# Patient Record
Sex: Male | Born: 1955 | Race: White | Hispanic: No | Marital: Single | Smoking: Former smoker
Health system: Southern US, Community
[De-identification: ages and names within clinical notes are randomized; demographics above are authoritative.]

## PROBLEM LIST (undated history)

## (undated) DIAGNOSIS — I10 Essential (primary) hypertension: Secondary | ICD-10-CM

## (undated) DIAGNOSIS — J449 Chronic obstructive pulmonary disease, unspecified: Secondary | ICD-10-CM

## (undated) DIAGNOSIS — E039 Hypothyroidism, unspecified: Secondary | ICD-10-CM

## (undated) DIAGNOSIS — R0600 Dyspnea, unspecified: Secondary | ICD-10-CM

## (undated) DIAGNOSIS — C801 Malignant (primary) neoplasm, unspecified: Secondary | ICD-10-CM

## (undated) DIAGNOSIS — I509 Heart failure, unspecified: Secondary | ICD-10-CM

## (undated) HISTORY — PX: BACK SURGERY: SHX140

---

## 2020-12-06 ENCOUNTER — Encounter (HOSPITAL_COMMUNITY): Payer: Self-pay

## 2020-12-06 NOTE — Progress Notes (Signed)
I placed an introductory phone call to this patient today. I introduced myself and explained my role in the patient's care. The patient tells me that he is new to living in this area as he moved down here to be with his children and grandchildren. Patient tells me that he has been receiving treatment for lung cancer for a year and is looking to establish care with a new oncologist. I reassure the patient that his previous oncologist has faxed his records to our clinic for Dr. Delton Coombes to review. I provided my contact information and encouraged the patient to call with any questions or concerns.

## 2020-12-08 ENCOUNTER — Inpatient Hospital Stay (HOSPITAL_COMMUNITY): Payer: Medicare Other | Attending: Hematology | Admitting: Hematology

## 2020-12-08 ENCOUNTER — Other Ambulatory Visit: Payer: Self-pay

## 2020-12-08 ENCOUNTER — Inpatient Hospital Stay (HOSPITAL_COMMUNITY): Payer: Medicare Other

## 2020-12-08 VITALS — BP 143/86 | HR 85 | Temp 97.3°F | Resp 18 | Ht 69.0 in | Wt 151.6 lb

## 2020-12-08 DIAGNOSIS — C3411 Malignant neoplasm of upper lobe, right bronchus or lung: Secondary | ICD-10-CM | POA: Insufficient documentation

## 2020-12-08 DIAGNOSIS — C3491 Malignant neoplasm of unspecified part of right bronchus or lung: Secondary | ICD-10-CM | POA: Insufficient documentation

## 2020-12-08 DIAGNOSIS — Z79899 Other long term (current) drug therapy: Secondary | ICD-10-CM | POA: Diagnosis not present

## 2020-12-08 DIAGNOSIS — C7931 Secondary malignant neoplasm of brain: Secondary | ICD-10-CM | POA: Diagnosis not present

## 2020-12-08 LAB — CBC WITH DIFFERENTIAL/PLATELET
Abs Immature Granulocytes: 0.02 10*3/uL (ref 0.00–0.07)
Basophils Absolute: 0.1 10*3/uL (ref 0.0–0.1)
Basophils Relative: 1 %
Eosinophils Absolute: 0.4 10*3/uL (ref 0.0–0.5)
Eosinophils Relative: 7 %
HCT: 44.3 % (ref 39.0–52.0)
Hemoglobin: 14.5 g/dL (ref 13.0–17.0)
Immature Granulocytes: 0 %
Lymphocytes Relative: 22 %
Lymphs Abs: 1.4 10*3/uL (ref 0.7–4.0)
MCH: 31.1 pg (ref 26.0–34.0)
MCHC: 32.7 g/dL (ref 30.0–36.0)
MCV: 95.1 fL (ref 80.0–100.0)
Monocytes Absolute: 0.6 10*3/uL (ref 0.1–1.0)
Monocytes Relative: 9 %
Neutro Abs: 4 10*3/uL (ref 1.7–7.7)
Neutrophils Relative %: 61 %
Platelets: 225 10*3/uL (ref 150–400)
RBC: 4.66 MIL/uL (ref 4.22–5.81)
RDW: 12.7 % (ref 11.5–15.5)
WBC: 6.5 10*3/uL (ref 4.0–10.5)
nRBC: 0 % (ref 0.0–0.2)

## 2020-12-08 LAB — COMPREHENSIVE METABOLIC PANEL
ALT: 18 U/L (ref 0–44)
AST: 22 U/L (ref 15–41)
Albumin: 4.3 g/dL (ref 3.5–5.0)
Alkaline Phosphatase: 81 U/L (ref 38–126)
Anion gap: 8 (ref 5–15)
BUN: 14 mg/dL (ref 8–23)
CO2: 27 mmol/L (ref 22–32)
Calcium: 9.2 mg/dL (ref 8.9–10.3)
Chloride: 97 mmol/L — ABNORMAL LOW (ref 98–111)
Creatinine, Ser: 0.85 mg/dL (ref 0.61–1.24)
GFR, Estimated: >60 mL/min (ref >60)
Glucose, Bld: 100 mg/dL — ABNORMAL HIGH (ref 70–99)
Potassium: 4.6 mmol/L (ref 3.5–5.1)
Sodium: 132 mmol/L — ABNORMAL LOW (ref 135–145)
Total Bilirubin: 0.7 mg/dL (ref 0.3–1.2)
Total Protein: 7.5 g/dL (ref 6.5–8.1)

## 2020-12-08 NOTE — Progress Notes (Signed)
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 65 y.o. 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 Dola Factor from [facility] in [place].  Today he is accompanied by his daughter-in-law, Remo Lipps, and he reports feeling well. His lung mass was discovered after his pericardial effusion was treated through a window and afterwards was detected on CT scan; he denies that he had headaches or CP. His last Beryle Flock was on February 16 and he has tolerated it well; he does not have a port. He did not receive radiation for his brain. He denies having any cardiac issues or MI's or CVA's. His breathing is now at baseline. His appetite is okay, though he still lost 4 lbs over the past month and 15 lbs since the beginning of 2022, and his family forces him to eat. He eats 2-3 meals per day.  He recently moved to [place] from [place]. He used to drive a truck. He quit smoking in August 2020. His father had prostate cancer. 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  MEDICAL HISTORY:  No past medical history on file.  SURGICAL HISTORY: ** The histories are not reviewed yet. Please review them in the "History" navigator section and refresh this [place].  SOCIAL HISTORY: Social History   Socioeconomic History  . Marital status: Single    Spouse name: Not on file  . Number of children: Not on file  . Years of education: Not on file  . Highest education level: Not on file  Occupational History  . Not on file  Tobacco Use  .  Smoking status: Former Smoker    Types: Cigarettes    Quit date: 12/01/2019    Years since quitting: 1.0  . Smokeless tobacco: Never Used  Substance and Sexual Activity  . Alcohol use: Never  . Drug use: Not on file  . Sexual activity: Not Currently  Other Topics Concern  . Not on file  Social History Narrative  . Not on file   Social Determinants of Health   Financial Resource Strain: Low Risk   . Difficulty of Paying Living Expenses: Not hard at all  Food Insecurity: No Food Insecurity  . Worried About Charity fundraiser in the Last Year: Never true  . Ran Out of Food in the Last Year: Never true  Transportation Needs: No Transportation Needs  . Lack of Transportation (Medical): No  . Lack of Transportation (Non-Medical): No  Physical Activity: Sufficiently Active  . Days of Exercise per Week: 7 days  . Minutes of Exercise per Session: 30 min  Stress: No Stress Concern Present  . Feeling of Stress : Only a little  Social Connections: Moderately Integrated  . Frequency of Communication with Friends and Family: More than three times a week  . Frequency of Social Gatherings with Friends and Family: More than three times a week  . Attends Religious Services: 1 to 4 times per year  . Active Member of Clubs or Organizations: No  . Attends Archivist Meetings: 1 to 4  times per year  . Marital Status: Divorced  Human resources officer Violence: Not At Risk  . Fear of Current or Ex-Partner: No  . Emotionally Abused: No  . Physically Abused: No  . Sexually Abused: No    FAMILY HISTORY: No family history on file.  ALLERGIES:  has no allergies on file.  MEDICATIONS:  Current Outpatient Medications  Medication Sig Dispense Refill  . albuterol (VENTOLIN HFA) 108 (90 Base) MCG/ACT inhaler Inhale into the lungs every 6 (six) hours as needed for wheezing or shortness of breath.    . fluticasone furoate-vilanterol (BREO ELLIPTA) 100-25 MCG/INH AEPB Inhale 1 puff into the lungs  daily.     No current facility-administered medications for this visit.    REVIEW OF SYSTEMS:   Review of Systems  Constitutional: Positive for unexpected weight change (lost 4 lbs in 1 month). Negative for appetite change and fatigue.  All other systems reviewed and are negative.    PHYSICAL EXAMINATION: ECOG PERFORMANCE STATUS: 1 - Symptomatic but completely ambulatory  Vitals:   12/08/20 0842  BP: (!) 143/86  Pulse: 85  Resp: 18  Temp: (!) 97.3 F (36.3 C)  SpO2: 98%   Filed Weights   12/08/20 0842  Weight: 151 lb 9.6 oz (68.8 kg)   Physical Exam Vitals reviewed.  Constitutional:      Appearance: Normal appearance.  Cardiovascular:     Rate and Rhythm: Normal rate and regular rhythm.     Pulses: Normal pulses.     Heart sounds: Normal heart sounds.  Pulmonary:     Effort: Pulmonary effort is normal.     Breath sounds: Normal breath sounds.  Chest:  Breasts:     Right: No supraclavicular adenopathy.     Left: No supraclavicular adenopathy.    Abdominal:     Palpations: Abdomen is soft. There is no hepatomegaly, splenomegaly or mass.     Tenderness: There is no abdominal tenderness.     Hernia: No hernia is present.  Lymphadenopathy:     Cervical: No cervical adenopathy.     Upper Body:     Right upper body: No supraclavicular adenopathy.     Left upper body: No supraclavicular adenopathy.     Lower Body: No right inguinal adenopathy. No left inguinal adenopathy.  Neurological:     General: No focal deficit present.     Mental Status: He is alert and oriented to person, place, and time.  Psychiatric:        Mood and Affect: Mood normal.        Behavior: Behavior normal.      LABORATORY DATA:  I have reviewed the data as listed CBC Latest Ref Rng & Units 12/08/2020  WBC 4.0 - 10.5 K/uL 6.5  Hemoglobin 13.0 - 17.0 g/dL 14.5  Hematocrit 39.0 - 52.0 % 44.3  Platelets 150 - 400 K/uL 225   CMP Latest Ref Rng & Units 12/08/2020  Glucose 70 - 99 mg/dL  100(H)  BUN 8 - 23 mg/dL 14  Creatinine 0.61 - 1.24 mg/dL 0.85  Sodium 135 - 145 mmol/L 132(L)  Potassium 3.5 - 5.1 mmol/L 4.6  Chloride 98 - 111 mmol/L 97(L)  CO2 22 - 32 mmol/L 27  Calcium 8.9 - 10.3 mg/dL 9.2  Total Protein 6.5 - 8.1 g/dL 7.5  Total Bilirubin 0.3 - 1.2 mg/dL 0.7  Alkaline Phos 38 - 126 U/L 81  AST 15 - 41 U/L 22  ALT 0 - 44 U/L 18    RADIOGRAPHIC  STUDIES: I have personally reviewed the radiological images as listed and agreed with the findings in the report. No results found.  ASSESSMENT:  1.  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 -Mutations were negative for EGFR, ALK, ROS1, RET, BRAF V600 -PD-L1 22 C3 greater than 95%. -He was started on single agent pembrolizumab every 3 weeks under the direction of Dr. Dola Factor in [place] in August 2020, later switched to every 6 weeks. -We will consider testing for K-ras G 12 C, NTR K and met exon 14 mutations upon progression.  2.  Social/family history: -He is a retired Administrator.  Quit smoking in August 2020. -Father had prostate cancer.    PLAN:  1.  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 -Last Keytruda every 6 weeks was on 11/17/2020. -We will obtain restaging CT CAP. -He will be back in 3 weeks to discuss results prior to his next dose of Keytruda.  2.  Brain metastasis: -He did not receive any radiation therapy. -He was supposed to have restaging MRI done in December in [place].  However it was not done as he was moving to [facility]. -We will arrange for the brain MRI with and without contrast.    All questions were answered. The patient knows to call the clinic with any problems, questions or concerns.   [name], MD, 12/08/20 5:40 PM  [place] [phone]   I, Milinda Antis, am acting as a scribe for Dr. Sanda Linger.  I, [name] MD, have reviewed the above documentation for accuracy and completeness, and I agree with the above.

## 2020-12-08 NOTE — Progress Notes (Signed)
START OFF PATHWAY REGIMEN - Non-Small Cell Lung   [id number]:Pembrolizumab 400 mg IV D1 q42 Days:   A cycle is every 42 days:     Pembrolizumab   **Always confirm dose/schedule in your pharmacy ordering system**  Patient Characteristics: Stage IV Metastatic, Nonsquamous, Molecular Analysis Completed, Molecular Alteration Present and Targeted Therapy Exhausted OR EGFR Exon 20+ or KRAS G12C+ Present and No Prior Chemo/Immunotherapy OR No Alteration Present, Initial  Chemotherapy/Immunotherapy, PS = 0, 1, No Alteration Present, Did Not Order Molecular Analysis/Quantity Not Sufficient for Molecular Analysis Therapeutic Status: Stage IV Metastatic Histology: Nonsquamous Cell Broad Molecular Profiling Status: Molecular Analysis Completed Molecular Analysis Results: No Alteration Present ECOG Performance Status: 1 Chemotherapy/Immunotherapy Line of Therapy: Initial Chemotherapy/Immunotherapy EGFR Exons 18-21 Mutation Testing Status: Completed and Negative ALK Fusion/Rearrangement Testing Status: Completed and Negative BRAF V600 Mutation Testing Status: Completed and Negative KRAS G12C Mutation Testing Status: Did Not Order Test MET Exon 14 Mutation Testing Status: Did Not Order Test RET Fusion/Rearrangement Testing Status: Completed and Negative NTRK Fusion/Rearrangement Testing Status: Did Not Order Test ROS1 Fusion/Rearrangement Testing Status: Completed and Negative Intent of Therapy: Non-Curative / Palliative Intent, Discussed with Patient

## 2020-12-08 NOTE — Patient Instructions (Signed)
[facility] Discharge Instructions  You were seen and examined today by Dr. Delton Coombes. Dr. Delton Coombes is a medical oncologist, meaning he specializes in managing cancer diagnoses with medication. Dr. Delton Coombes discussed your past medical history, family history of cancer and your recent cancer diagnosis and treatment regimen.  Dr. Delton Coombes has recommended that you begin eating calorie-rich foods to avoiding you losing any additional weight. Dr. Delton Coombes has recommended restaging scans, this involves an MRI of your brain and a CT of your chest, abdomen and pelvis. We will continue your current treatment regimen of Keytruda every 6 weeks.  Follow-up as scheduled.   Thank you for choosing [facility] at [facility] [place] to provide your oncology and hematology care.  To afford each patient quality time with our provider, please arrive at least 15 minutes before your scheduled appointment time.   If you have a lab appointment with the [facility] please come in thru the Main Entrance and check in at the main information desk.  You need to re-schedule your appointment should you arrive 10 or more minutes late.  We strive to give you quality time with our providers, and arriving late affects you and other patients whose appointments are after yours.  Also, if you no show three or more times for appointments you may be dismissed from the clinic at the providers discretion.     Again, thank you for choosing [facility].  Our hope is that these requests will decrease the amount of time that you wait before being seen by our physicians.       _____________________________________________________________  Should you have questions after your visit to [facility], please contact our office at [phone] and follow the prompts.  Our office hours are 8:00 a.m. and 4:30 p.m. Monday - Friday.  Please note that voicemails  left after 4:00 p.m. may not be returned until the following business day.  We are closed weekends and major holidays.  You do have access to a nurse 24-7, just call the main number to the clinic [phone] and do not press any options, hold on the line and a nurse will answer the phone.    For prescription refill requests, have your pharmacy contact our office and allow 72 hours.    Due to Covid, you will need to wear a mask upon entering the hospital. If you do not have a mask, a mask will be given to you at the Main Entrance upon arrival. For doctor visits, patients may have 1 support person age 24 or older with them. For treatment visits, patients can not have anyone with them due to social distancing guidelines and our immunocompromised population.

## 2020-12-09 ENCOUNTER — Ambulatory Visit
Admission: RE | Admit: 2020-12-09 | Discharge: 2020-12-09 | Disposition: A | Discharge status: Home / Self Care | Payer: Self-pay | Source: Ambulatory Visit | Attending: Hematology | Admitting: Hematology

## 2020-12-09 ENCOUNTER — Other Ambulatory Visit (HOSPITAL_COMMUNITY): Payer: Self-pay | Admitting: Hematology

## 2020-12-09 DIAGNOSIS — C3411 Malignant neoplasm of upper lobe, right bronchus or lung: Secondary | ICD-10-CM

## 2020-12-24 ENCOUNTER — Ambulatory Visit (HOSPITAL_COMMUNITY)
Admission: RE | Admit: 2020-12-24 | Discharge: 2020-12-24 | Disposition: A | Discharge status: Home / Self Care | Payer: Medicare Other | Source: Ambulatory Visit | Attending: Hematology | Admitting: Hematology

## 2020-12-24 DIAGNOSIS — C3411 Malignant neoplasm of upper lobe, right bronchus or lung: Secondary | ICD-10-CM | POA: Diagnosis not present

## 2020-12-24 IMAGING — MR MR HEAD WO/W CM
14 of 15 series · 38 of 48 positions shown · IV contrast (gadavist)
Comparison: [DATE].  [DATE].  [DATE]. CT [DATE].

CLINICAL DATA: Metastatic non-small cell lung cancer

EXAM:
MRI HEAD WITHOUT AND WITH CONTRAST
TECHNIQUE: Multiplanar, multiecho pulse sequences of the brain and surrounding
structures were obtained without and with intravenous contrast.
CONTRAST:  7mL GADAVIST GADOBUTROL 1 MMOL/ML IV SOLN

[Series 5: DWI · axial · 3.0mm · 0.77mm/px · z∈[-53,+91]mm · 2 of 50 slices shown (1 of 4)]
[im 1/50]
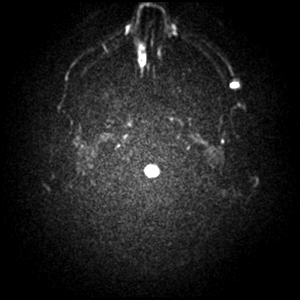
[im 50/50]
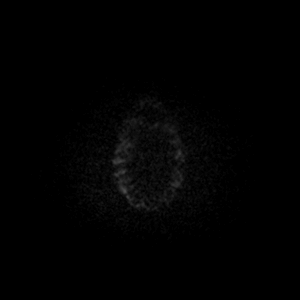

[Series 6: DWI · axial · 3.0mm · 0.77mm/px · z∈[-53,+91]mm · 3 of 50 slices shown (2 of 4)]
[im 1/50]
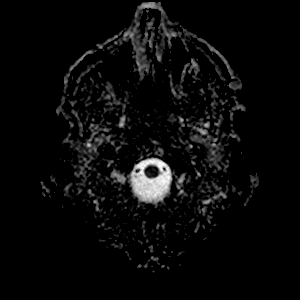
[im 25/50]
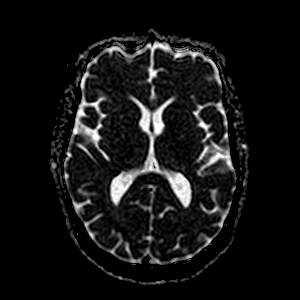
[im 50/50]
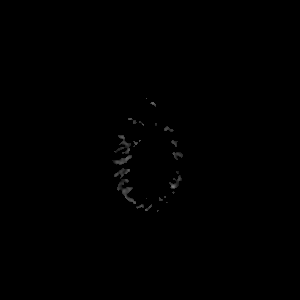

[Series 7: DWI · coronal · 5.0mm · 0.88mm/px · 2 of 28 slices shown (3 of 4)]
[im 1/28]
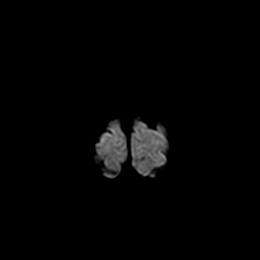
[im 28/28]
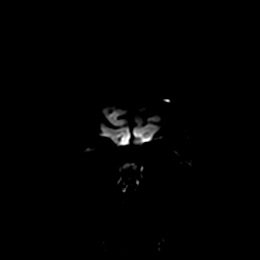

[Series 8: DWI · coronal · 5.0mm · 0.88mm/px · 2 of 28 slices shown (4 of 4)]
[im 1/28]
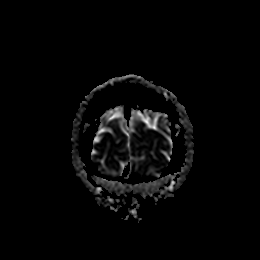
[im 28/28]
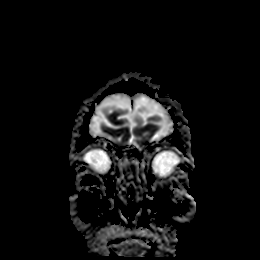

[Series 9: T1 · sagittal · 5.0mm · 0.75mm/px · 1 of 21 slices shown (1 of 2)]
[im 1/21]
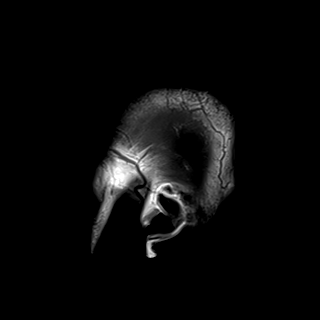

[Series 10: T2 · axial · 5.0mm · 0.72mm/px · 1 of 23 slices shown (1 of 2)]
[im 1/23]
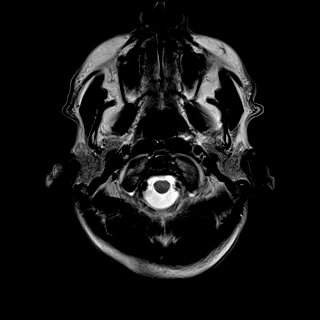

[Series 11: mag_images · axial · 3.0mm · 0.90mm/px · z∈[-65,+108]mm · 3 of 60 slices shown]
[im 1/60]
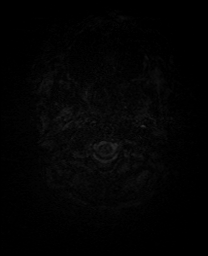
[im 30/60]
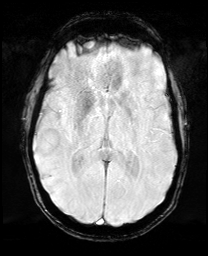
[im 60/60]
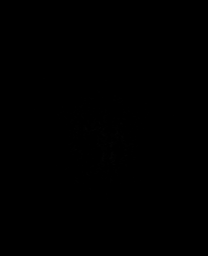

[Series 12: pha_images · axial · 3.0mm · 0.90mm/px · z∈[-65,+105]mm · 3 of 57 slices shown]
[im 1/57]
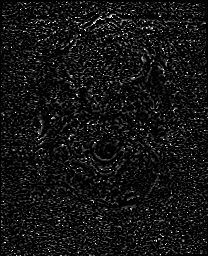
[im 29/57]
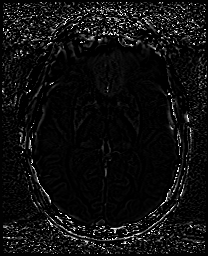
[im 57/57]
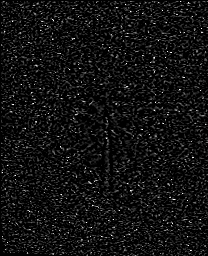

[Series 13: swi_images · axial · 3.0mm · 0.90mm/px · z∈[-65,+108]mm · 3 of 60 slices shown]
[im 1/60]
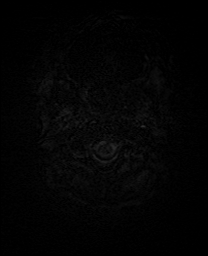
[im 30/60]
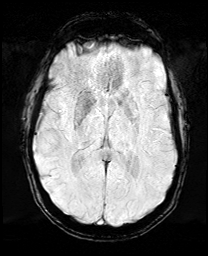
[im 60/60]
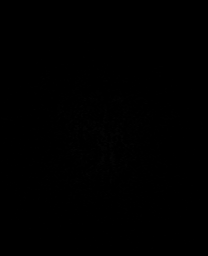

[Series 15: FLAIR · axial · 3.0mm · 0.45mm/px · z∈[-50,+94]mm · 3 of 50 slices shown]
[im 1/50]
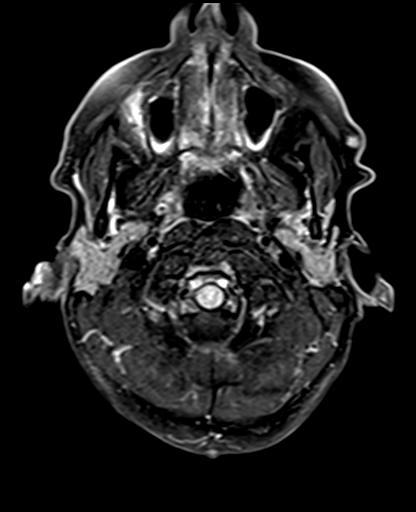
[im 25/50]
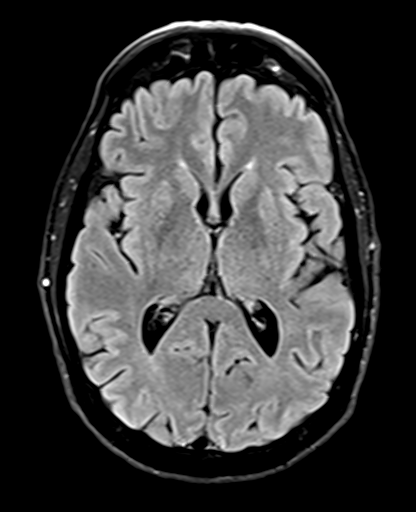
[im 50/50]
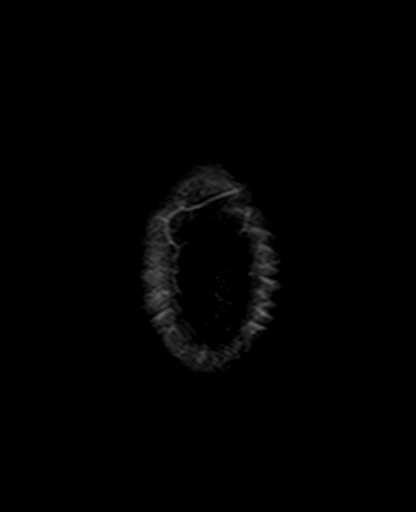

[Series 16: T1 · axial · 1.0mm · 0.98mm/px · z∈[-64,+107]mm · 10 of 175 slices shown (2 of 2)]
[im 1/175]
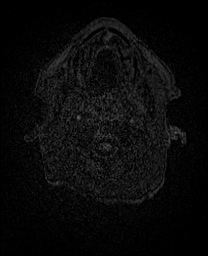
[im 20/175]
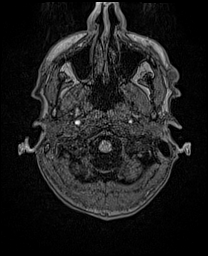
[im 39/175]
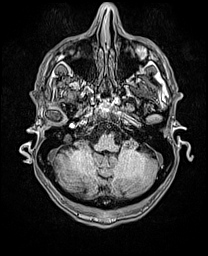
[im 59/175]
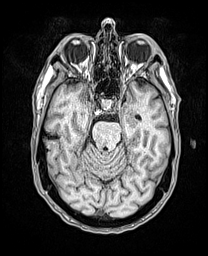
[im 78/175]
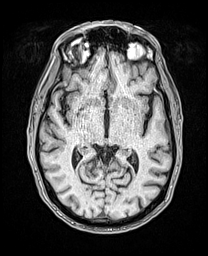
[im 97/175]
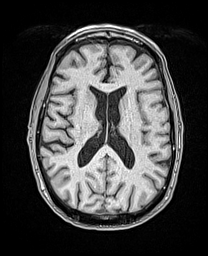
[im 117/175]
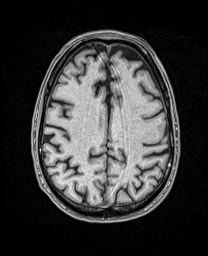
[im 136/175]
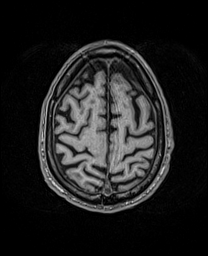
[im 155/175]
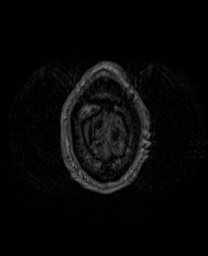
[im 175/175]
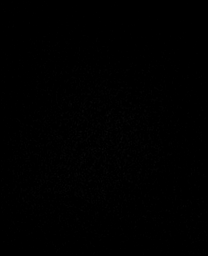

[Series 17: T2 · coronal · 5.0mm · 0.72mm/px · 2 of 28 slices shown (2 of 2)]
[im 1/28]
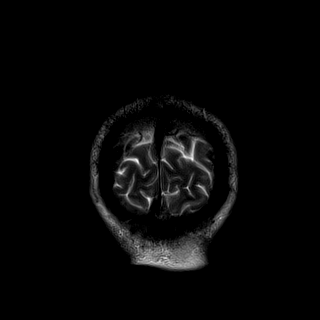
[im 28/28]
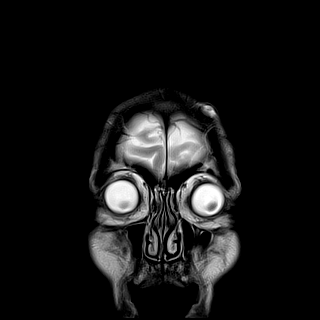

[Series 19: T1 post-contrast · coronal · 5.0mm · 0.34mm/px · 2 of 28 slices shown (1 of 2)]
[im 1/28]
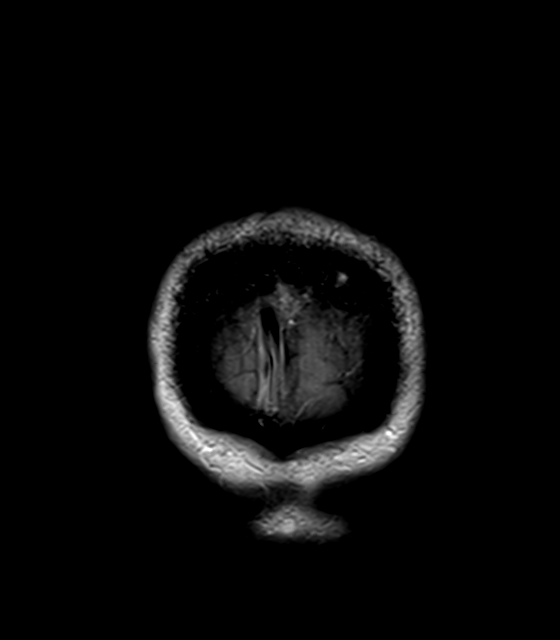
[im 28/28]
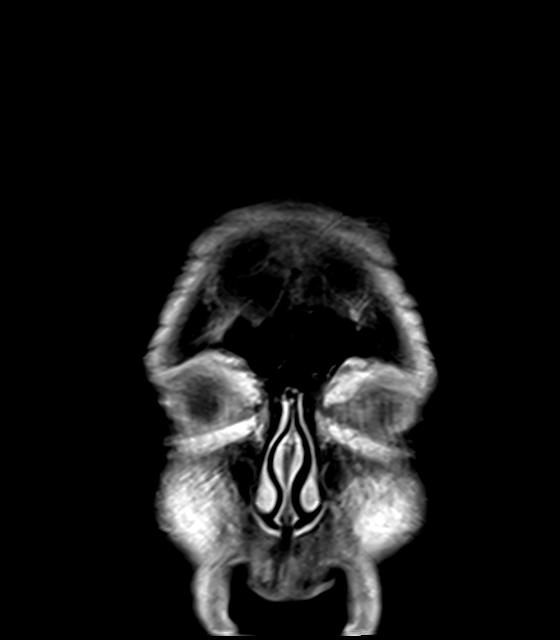

[Series 20: T1 post-contrast · sagittal · 5.0mm · 0.75mm/px · 1 of 21 slices shown (2 of 2)]
[im 1/21]
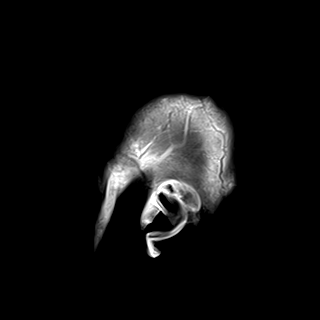

[38 of 48 positions shown; findings below may reference images not displayed]

FINDINGS: Brain: The brain has a normal appearance without evidence of
malformation, atrophy, old or acute small or large vessel
infarction, mass lesion, hemorrhage, hydrocephalus or extra-axial
collection. After contrast administration, no abnormal enhancement
occurs.

Vascular: Major vessels at the base of the brain show flow. Venous
sinuses appear patent.

Skull and upper cervical spine: No abnormality of the calvarium or
skull base. Chronically seen sclerotic focus within the C3 vertebral
body, grossly the same when compared to studies as distant as
[DATE]. Therefore, this probably does not represent active
metastatic disease.

Sinuses/Orbits: Clear/normal.

Other: Presumed sebaceous cyst of the left frontal scalp.
IMPRESSION: 1. Normal appearance of the brain itself. No evidence of metastatic
disease.
2. Chronically seen sclerotic focus within the C3 vertebral body,
grossly the same when compared to studies as distant as [DATE].
Therefore, this probably does not represent active or viable
metastatic disease.

## 2020-12-24 IMAGING — CT CT CHEST-ABD-PELV W/ CM
2 of 5 series · 12 of 36 positions shown, 14 images · IV contrast (Omnipaque or Isovue)
Comparison: Outside chest CT [DATE].

CLINICAL DATA: 65-year-old male with history of non-small cell lung
cancer with metastatic disease. Evaluate for response to therapy.

EXAM:
CT CHEST, ABDOMEN, AND PELVIS WITH CONTRAST
TECHNIQUE: Multidetector CT imaging of the chest, abdomen and pelvis was
performed following the standard protocol during bolus
administration of intravenous contrast.
CONTRAST:  100mL OMNIPAQUE IOHEXOL 300 MG/ML  SOLN

[Series 2: cap with · axial · 0.78mm/px · z∈[+953,+1473]mm · 9 of 130 slices shown, 11 images]
[im 13/130  mediastinal]
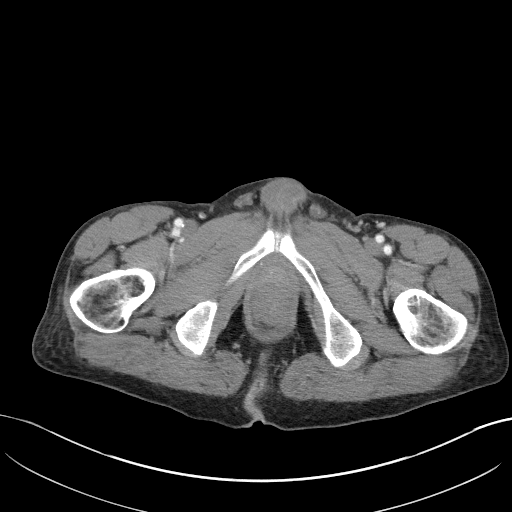
[im 13/130  bone]
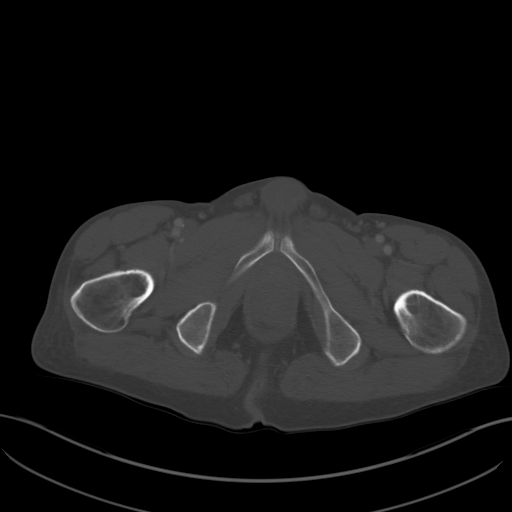
[im 26/130  mediastinal]
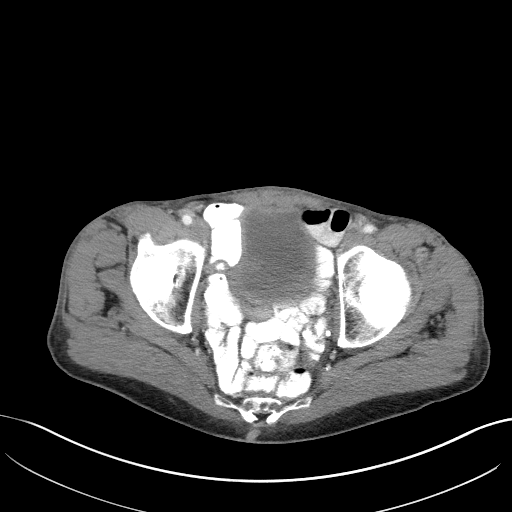
[im 39/130  mediastinal]
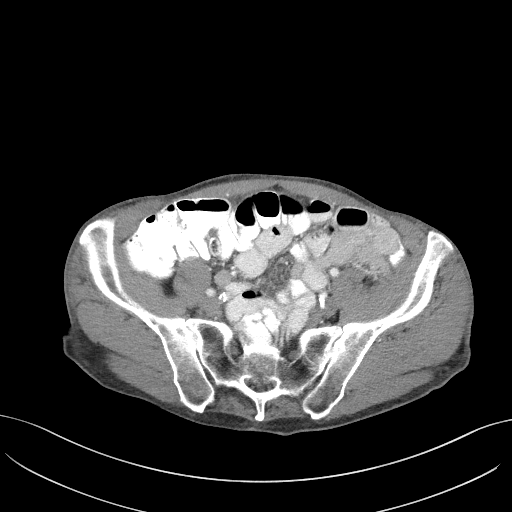
[im 52/130  mediastinal]
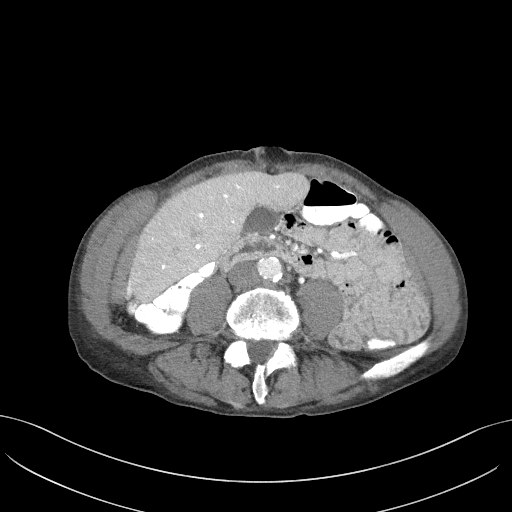
[im 65/130  mediastinal]
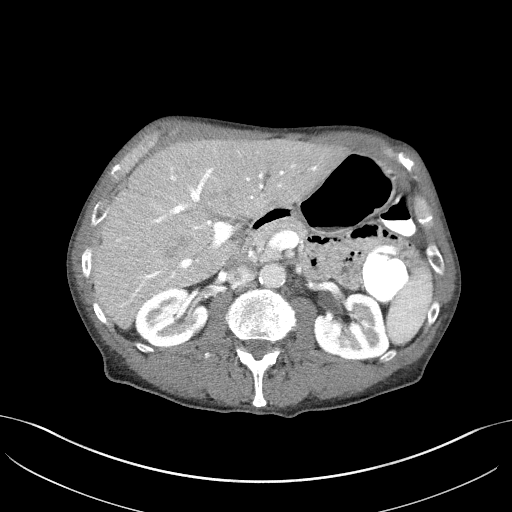
[im 78/130  mediastinal]
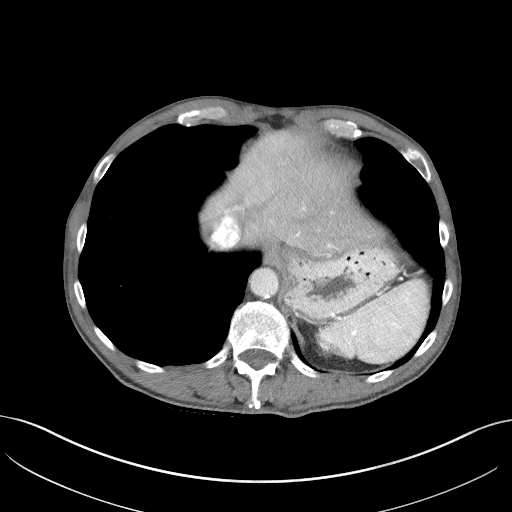
[im 91/130  mediastinal]
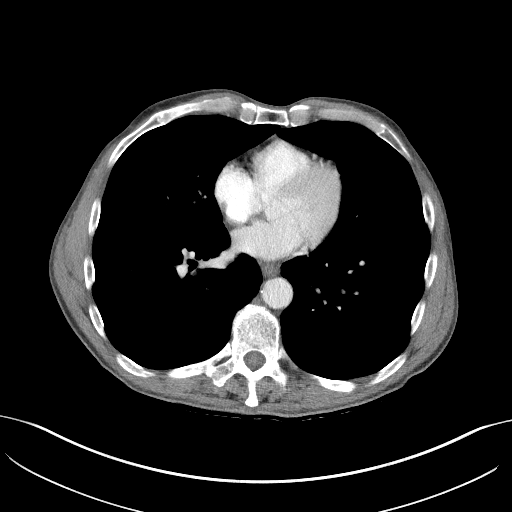
[im 104/130  mediastinal]
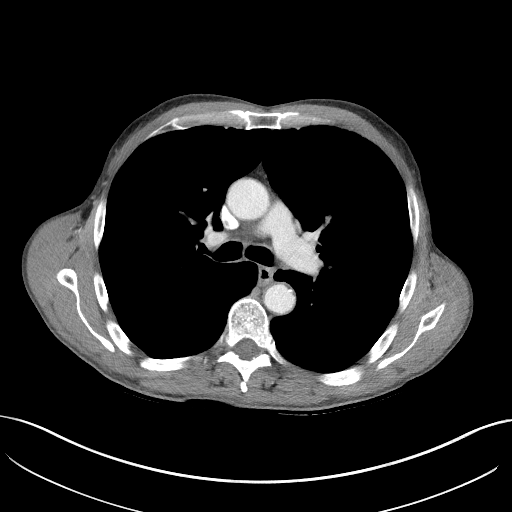
[im 117/130  mediastinal]
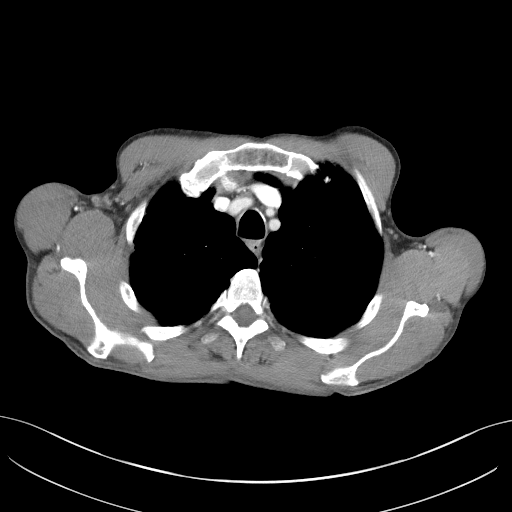
[im 117/130  bone]
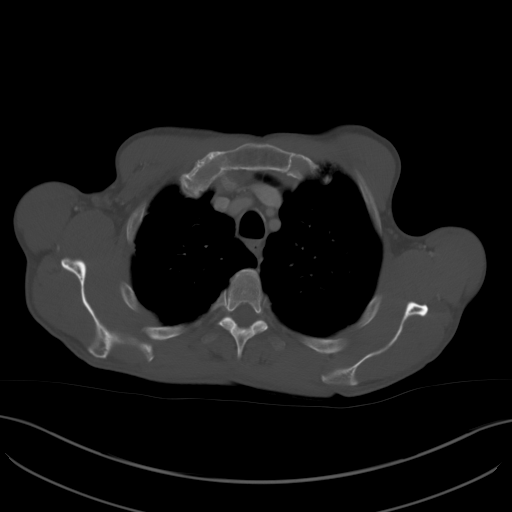

[Series 4: coronals · coronal · 0.81mm/px · 3 of 145 slices shown]
[im 29/145  mediastinal]
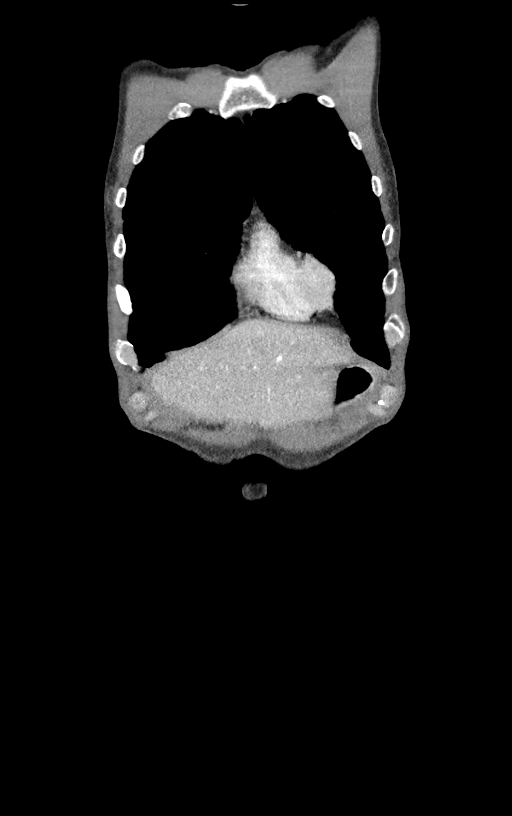
[im 58/145  mediastinal]
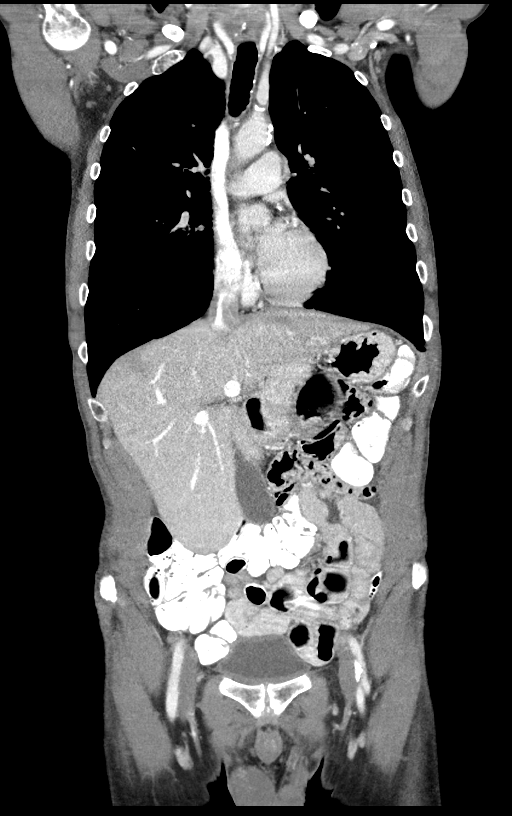
[im 87/145  mediastinal]
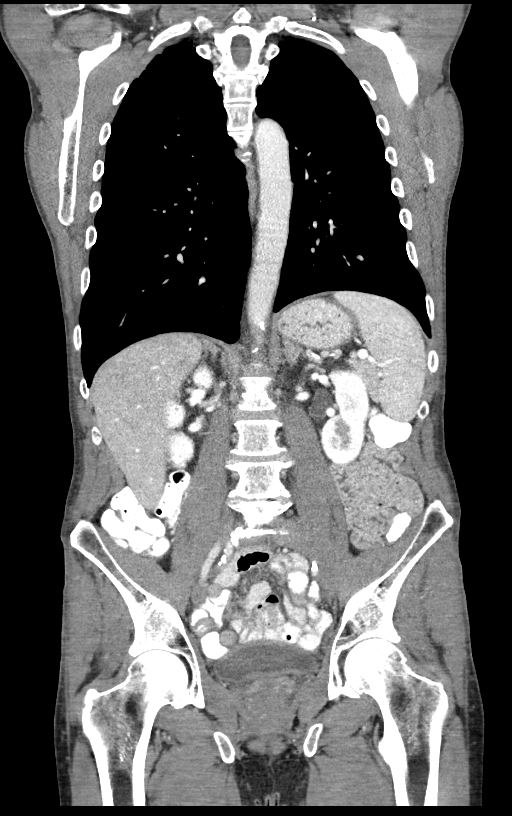

[12 of 36 positions shown; findings below may reference images not displayed]

FINDINGS: CT CHEST FINDINGS

Cardiovascular: Heart size is normal. There is no significant
pericardial fluid, thickening or pericardial calcification. There is
aortic atherosclerosis, as well as atherosclerosis of the great
vessels of the mediastinum and the coronary arteries, including
calcified atherosclerotic plaque in the left main, left anterior
descending, left circumflex and right coronary arteries.

Mediastinum/Nodes: No pathologically enlarged mediastinal or hilar
lymph nodes. Esophagus is unremarkable in appearance. No axillary
lymphadenopathy.

Lungs/Pleura: In the right upper lobe (axial image 44 of series 3
and coronal image 67 of series 4) there is a 3.1 x 1.2 x 2.9 cm
macrolobulated mass with spiculated margins which appears relatively
similar in size compared to the prior study from [DATE], and
is surrounded by linear areas of architectural distortion which are
likely to reflect chronic post treatment related changes of
fibrosis. Previously noted area of dense airspace consolidation in
the posterior aspect of the right upper lobe has resolved, as has
the additional areas of faint airspace consolidation elsewhere in
the right upper and lower lobes, presumably a resolved pneumonia or
resolved postradiation pneumonitis. A few scattered tiny 3-4 mm
pulmonary nodules are noted in the periphery of the lungs, stable
compared to the prior study, nonspecific, but statistically likely
benign. No other new suspicious appearing pulmonary nodules or
masses are noted. No new acute consolidative airspace disease. No
pleural effusions. Diffuse bronchial wall thickening with mild to
moderate centrilobular and paraseptal emphysema.

Musculoskeletal: There are no aggressive appearing lytic or blastic
lesions noted in the visualized portions of the skeleton.

CT ABDOMEN PELVIS FINDINGS

Hepatobiliary: No discrete cystic or. Solid No intra or hepatic
lesions extrahepatic biliary ductal dilatation. Gallbladder is
normal in appearance.

Pancreas: No pancreatic mass. No pancreatic ductal dilatation. No
pancreatic or peripancreatic fluid collections or inflammatory
changes.

Spleen: Unremarkable.

Adrenals/Urinary Tract: Bilateral kidneys and the right adrenal
gland are normal in appearance. Some adreniform thickening of the
left adrenal gland is noted, stable compared to the prior study,
presumably reflective of adrenal hyperplasia. No
hydroureteronephrosis. Urinary bladder is normal in appearance.

Stomach/Bowel: Normal appearance of the stomach. No pathologic
dilatation of small bowel or colon. Normal appendix.

Vascular/Lymphatic: Aortic atherosclerosis, without evidence of
aneurysm or dissection in the abdominal or pelvic vasculature. No
lymphadenopathy noted in the abdomen or pelvis.

Reproductive: Prostate gland and seminal vesicles are unremarkable
in appearance.

Other: Small umbilical hernia containing only omental fat. No
significant volume of ascites. No pneumoperitoneum.

Musculoskeletal: There are no aggressive appearing lytic or blastic
lesions noted in the visualized portions of the skeleton.
IMPRESSION: 1. Previously noted right upper lobe mass is stable in size compared
to the prior study, likely to represent a treated lesion, with some
surrounding postradiation changes. No definitive findings to suggest
new metastatic disease elsewhere in the chest, abdomen or pelvis.
2. Small 3-4 mm pulmonary nodules in the lungs bilaterally,
nonspecific, but similar to the prior study and statistically likely
to be benign. Attention at time of routine follow-up imaging is
recommended to ensure stability.
3. Stable adreniform thickening of the left adrenal gland, likely
reflective of adrenal hyperplasia. Attention on follow-up studies is
recommended to ensure continued stability.
4. Small umbilical hernia containing only omental fat. No associated
bowel incarceration or obstruction at this time.
5. Aortic atherosclerosis, in addition to left main and 3 vessel
coronary artery disease. Please note that although the presence of
coronary artery calcium documents the presence of coronary artery
disease, the severity of this disease and any potential stenosis
cannot be assessed on this non-gated CT examination. Assessment for
potential risk factor modification, dietary therapy or pharmacologic
therapy may be warranted, if clinically indicated.
6. Mild diffuse bronchial wall thickening with mild to moderate
centrilobular and paraseptal emphysema; imaging findings suggestive
of underlying COPD.

Aortic Atherosclerosis ([0C]-[0C]) and Emphysema ([0C]-[0C]).

## 2020-12-24 MED ORDER — GADOBUTROL 1 MMOL/ML IV SOLN
7.0000 mL | Freq: Once | INTRAVENOUS | Status: AC | PRN
  Administered 2020-12-24: 7 mL via INTRAVENOUS

## 2020-12-24 MED ORDER — IOHEXOL 300 MG/ML  SOLN
100.0000 mL | Freq: Once | INTRAMUSCULAR | Status: AC | PRN
  Administered 2020-12-24: 100 mL via INTRAVENOUS

## 2020-12-28 NOTE — Progress Notes (Signed)
Pharmacist Chemotherapy Monitoring - Initial Assessment    Anticipated start date: 12/29/20   Regimen:  . Are orders appropriate based on the patient's diagnosis, regimen, and cycle? Yes . Does the plan date match the patient's scheduled date? Yes . Is the sequencing of drugs appropriate? Yes . Are the premedications appropriate for the patient's regimen? Yes . Prior Authorization for treatment is: Approved o If applicable, is the correct biosimilar selected based on the patient's insurance? not applicable  Organ Function and Labs: Marland Kitchen Are dose adjustments needed based on the patient's renal function, hepatic function, or hematologic function? No . Are appropriate labs ordered prior to the start of patient's treatment? Yes . Other organ system assessment, if indicated: N/A . The following baseline labs, if indicated, have been ordered: pembrolizumab: baseline TSH +/- T4  Dose Assessment: . Are the drug doses appropriate? Yes . Are the following correct: o Drug concentrations Yes o IV fluid compatible with drug Yes o Administration routes Yes o Timing of therapy Yes . If applicable, does the patient have documented access for treatment and/or plans for port-a-cath placement? not applicable . If applicable, have lifetime cumulative doses been properly documented and assessed? not applicable Lifetime Dose Tracking  No doses have been documented on this patient for the following tracked chemicals: Doxorubicin, Epirubicin, Idarubicin, Daunorubicin, Mitoxantrone, Bleomycin, Oxaliplatin, Carboplatin, Liposomal Doxorubicin  o   Toxicity Monitoring/Prevention: . The patient has the following take home antiemetics prescribed: N/A . The patient has the following take home medications prescribed: N/A . Medication allergies and previous infusion related reactions, if applicable, have been reviewed and addressed. Yes . The patient's current medication list has been assessed for drug-drug  interactions with their chemotherapy regimen. no significant drug-drug interactions were identified on review.  Order Review: . Are the treatment plan orders signed? No . Is the patient scheduled to see a provider prior to their treatment? Yes  I verify that I have reviewed each item in the above checklist and answered each question accordingly.  Wynona Neat, RPH, 12/28/2020  1:16 PM

## 2020-12-29 ENCOUNTER — Inpatient Hospital Stay (HOSPITAL_COMMUNITY): Payer: Medicare Other

## 2020-12-29 ENCOUNTER — Other Ambulatory Visit: Payer: Self-pay

## 2020-12-29 ENCOUNTER — Inpatient Hospital Stay (HOSPITAL_BASED_OUTPATIENT_CLINIC_OR_DEPARTMENT_OTHER): Payer: Medicare Other | Admitting: Hematology

## 2020-12-29 VITALS — BP 138/86 | HR 78 | Temp 96.9°F | Resp 18

## 2020-12-29 VITALS — BP 138/78 | HR 81 | Temp 97.0°F | Resp 18 | Wt 152.0 lb

## 2020-12-29 DIAGNOSIS — C3491 Malignant neoplasm of unspecified part of right bronchus or lung: Secondary | ICD-10-CM

## 2020-12-29 DIAGNOSIS — C3411 Malignant neoplasm of upper lobe, right bronchus or lung: Secondary | ICD-10-CM | POA: Diagnosis not present

## 2020-12-29 LAB — CBC WITH DIFFERENTIAL/PLATELET
Abs Immature Granulocytes: 0.03 10*3/uL (ref 0.00–0.07)
Basophils Absolute: 0.1 10*3/uL (ref 0.0–0.1)
Basophils Relative: 1 %
Eosinophils Absolute: 0.4 10*3/uL (ref 0.0–0.5)
Eosinophils Relative: 5 %
HCT: 44.3 % (ref 39.0–52.0)
Hemoglobin: 14.7 g/dL (ref 13.0–17.0)
Immature Granulocytes: 0 %
Lymphocytes Relative: 21 %
Lymphs Abs: 1.4 10*3/uL (ref 0.7–4.0)
MCH: 31.7 pg (ref 26.0–34.0)
MCHC: 33.2 g/dL (ref 30.0–36.0)
MCV: 95.7 fL (ref 80.0–100.0)
Monocytes Absolute: 0.7 10*3/uL (ref 0.1–1.0)
Monocytes Relative: 10 %
Neutro Abs: 4.3 10*3/uL (ref 1.7–7.7)
Neutrophils Relative %: 63 %
Platelets: 271 10*3/uL (ref 150–400)
RBC: 4.63 MIL/uL (ref 4.22–5.81)
RDW: 13 % (ref 11.5–15.5)
WBC: 6.8 10*3/uL (ref 4.0–10.5)
nRBC: 0 % (ref 0.0–0.2)

## 2020-12-29 LAB — COMPREHENSIVE METABOLIC PANEL
ALT: 26 U/L (ref 0–44)
AST: 32 U/L (ref 15–41)
Albumin: 4.2 g/dL (ref 3.5–5.0)
Alkaline Phosphatase: 76 U/L (ref 38–126)
Anion gap: 9 (ref 5–15)
BUN: 17 mg/dL (ref 8–23)
CO2: 26 mmol/L (ref 22–32)
Calcium: 9.4 mg/dL (ref 8.9–10.3)
Chloride: 96 mmol/L — ABNORMAL LOW (ref 98–111)
Creatinine, Ser: 0.79 mg/dL (ref 0.61–1.24)
GFR, Estimated: >60 mL/min (ref >60)
Glucose, Bld: 101 mg/dL — ABNORMAL HIGH (ref 70–99)
Potassium: 4.3 mmol/L (ref 3.5–5.1)
Sodium: 131 mmol/L — ABNORMAL LOW (ref 135–145)
Total Bilirubin: 0.5 mg/dL (ref 0.3–1.2)
Total Protein: 7.1 g/dL (ref 6.5–8.1)

## 2020-12-29 LAB — TSH: TSH: 2.286 u[IU]/mL (ref 0.350–4.500)

## 2020-12-29 LAB — MAGNESIUM: Magnesium: 2 mg/dL (ref 1.7–2.4)

## 2020-12-29 MED ORDER — SODIUM CHLORIDE 0.9 % IV SOLN
400.0000 mg | Freq: Once | INTRAVENOUS | Status: AC
  Administered 2020-12-29: 400 mg via INTRAVENOUS
  Filled 2020-12-29: qty 16

## 2020-12-29 MED ORDER — SODIUM CHLORIDE 0.9 % IV SOLN: Freq: Once | INTRAVENOUS | Status: AC

## 2020-12-29 MED ORDER — BREO ELLIPTA 100-25 MCG/INH IN AEPB: 1.0000 | INHALATION_SPRAY | Freq: Every day | RESPIRATORY_TRACT | 6 refills | Status: DC

## 2020-12-29 MED ORDER — ALBUTEROL SULFATE HFA 108 (90 BASE) MCG/ACT IN AERS: 2.0000 | INHALATION_SPRAY | RESPIRATORY_TRACT | 6 refills | Status: DC | PRN

## 2020-12-29 NOTE — Patient Instructions (Signed)
[place] at [facility] Discharge Instructions  You were seen today by Dr. Delton Coombes. He went over your recent results and scans. You received your treatment today. Dr. Delton Coombes will see you back in 6 weeks for labs and follow up.   Thank you for choosing [place] at [facility] to provide your oncology and hematology care.  To afford each patient quality time with our provider, please arrive at least 15 minutes before your scheduled appointment time.   If you have a lab appointment with the [facility] please come in thru the Main Entrance and check in at the main information desk  You need to re-schedule your appointment should you arrive 10 or more minutes late.  We strive to give you quality time with our providers, and arriving late affects you and other patients whose appointments are after yours.  Also, if you no show three or more times for appointments you may be dismissed from the clinic at the providers discretion.     Again, thank you for choosing [facility].  Our hope is that these requests will decrease the amount of time that you wait before being seen by our physicians.       _____________________________________________________________  Should you have questions after your visit to [facility], please contact our office at [phone] between the hours of 8:00 a.m. and 4:30 p.m.  Voicemails left after 4:00 p.m. will not be returned until the following business day.  For prescription refill requests, have your pharmacy contact our office and allow 72 hours.    Cancer Center Support Programs:   > Cancer Support Group  2nd Tuesday of the month 1pm-2pm, Journey Room

## 2020-12-29 NOTE — Progress Notes (Signed)
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  PRIOR THERAPY: None  NGS Results: PD-L1 TPS >95%; EGFR, ALK, ROS1, RET, BRAF V600E negative  CURRENT THERAPY: Keytruda every 6 weeks  BRIEF ONCOLOGIC HISTORY:  Oncology History  Adenocarcinoma of lung, stage 4, right ([place])  12/08/2020 Initial Diagnosis   Adenocarcinoma of lung, stage 4, right ([place])   12/08/2020 Cancer Staging   Staging form: Lung, AJCC 8th Edition - Clinical stage from 12/08/2020: Stage IVB (cT2b, cN3, pM1c) - Signed by [name], MD on 12/08/2020 Histopathologic type: Adenocarcinoma, NOS   12/29/2020 -  Chemotherapy    Antonio Payne is on Treatment Plan: LUNG PEMBROLIZUMAB Q42D        CANCER STAGING: Cancer Staging Adenocarcinoma of lung, stage 4, right [facility]) Staging form: Lung, AJCC 8th Edition - Clinical stage from 12/08/2020: Stage IVB (cT2b, cN3, pM1c) - Signed by [name], MD on 12/08/2020   INTERVAL HISTORY:  Antonio Payne, a 65 y.o. male, returns for routine follow-up and consideration for next cycle of immunotherapy. [name] was last seen on 12/08/2020.  Due for cycle #1 of Keytruda today.   Overall, he tells me he has been feeling pretty well. He denies having diarrhea, skin rashes, dry cough. His appetite is excellent and he has been gaining weight.  Overall, he feels ready for next cycle of immunotherapy today.    REVIEW OF SYSTEMS:  Review of Systems  Constitutional: Positive for fatigue (75%). Negative for appetite change and unexpected weight change.  Respiratory: Negative for cough.   Gastrointestinal: Negative for diarrhea.  Skin: Negative for rash.  All other systems reviewed and are negative.   PAST MEDICAL/SURGICAL HISTORY:  Antonio past medical history on file. ** The histories  are not reviewed yet. Please review them in the "History" navigator section and refresh this [place].  SOCIAL HISTORY:  Social History   Socioeconomic History  . Marital status: Single    Spouse name: Not on file  . Number of children: Not on file  . Years of education: Not on file  . Highest education level: Not on file  Occupational History  . Not on file  Tobacco Use  . Smoking status: Former Smoker    Types: Cigarettes    Quit date: 12/01/2019    Years since quitting: 1.0  . Smokeless tobacco: Never Used  Substance and Sexual Activity  . Alcohol use: Never  . Drug use: Not on file  . Sexual activity: Not Currently  Other Topics Concern  . Not on file  Social History Narrative  . Not on file   Social Determinants of Health   Financial Resource Strain: Low Risk   . Difficulty of Paying Living Expenses: Not hard at all  Food Insecurity: Antonio Food Insecurity  . Worried About Charity fundraiser in the Last Year: Never true  . Ran Out of Food in the Last Year: Never true  Transportation Needs: Antonio Transportation Needs  . Lack of Transportation (Medical): Antonio  . Lack of Transportation (Non-Medical): Antonio  Physical Activity: Sufficiently Active  . Days of Exercise Payne Week: 7 days  . Minutes of Exercise Payne Session: 30 min  Stress: Antonio Stress Concern Present  . Feeling of Stress : Only a little  Social Connections: Moderately Integrated  . Frequency of  Communication with Friends and Family: More than three times a week  . Frequency of Social Gatherings with Friends and Family: More than three times a week  . Attends Religious Services: 1 to 4 times Payne year  . Active Member of Clubs or Organizations: Antonio  . Attends Banker Meetings: 1 to 4 times Payne year  . Marital Status: Divorced  Catering manager Violence: Not At Risk  . Fear of Current or Ex-Partner: Antonio  . Emotionally Abused: Antonio  . Physically Abused: Antonio  . Sexually Abused: Antonio    FAMILY HISTORY:  Antonio  family history on file.  CURRENT MEDICATIONS:  Current Outpatient Medications  Medication Sig Dispense Refill  . albuterol (VENTOLIN HFA) 108 (90 Base) MCG/ACT inhaler Inhale 2 puffs into the lungs every 4 (four) hours as needed for wheezing or shortness of breath. 8 g 6  . fluticasone furoate-vilanterol (BREO ELLIPTA) 100-25 MCG/INH AEPB Inhale 1 puff into the lungs daily. 60 each 6   Antonio current facility-administered medications for this visit.    ALLERGIES:  Not on File  PHYSICAL EXAM:  Performance status (ECOG): 1 - Symptomatic but completely ambulatory  Vitals:   12/29/20 1002  BP: 138/78  Pulse: 81  Resp: 18  Temp: (!) 97 F (36.1 C)  SpO2: 98%   Wt Readings from Last 3 Encounters:  12/29/20 152 lb (68.9 kg)  12/08/20 151 lb 9.6 oz (68.8 kg)   Physical Exam Vitals reviewed.  Constitutional:      Appearance: Normal appearance.  Cardiovascular:     Rate and Rhythm: Normal rate and regular rhythm.     Pulses: Normal pulses.     Heart sounds: Normal heart sounds.  Pulmonary:     Effort: Pulmonary effort is normal.     Breath sounds: Normal breath sounds.  Neurological:     General: Antonio focal deficit present.     Mental Status: He is alert and oriented to person, place, and time.  Psychiatric:        Mood and Affect: Mood normal.        Behavior: Behavior normal.     LABORATORY DATA:  I have reviewed the labs as listed.  CBC Latest Ref Rng & Units 12/29/2020 12/08/2020  WBC 4.0 - 10.5 K/uL 6.8 6.5  Hemoglobin 13.0 - 17.0 g/dL 65.2 69.7  Hematocrit 19.5 - 52.0 % 44.3 44.3  Platelets 150 - 400 K/uL 271 225   CMP Latest Ref Rng & Units 12/29/2020 12/08/2020  Glucose 70 - 99 mg/dL 386(Z) 305(I)  BUN 8 - 23 mg/dL 17 14  Creatinine 0.81 - 1.24 mg/dL 5.86 8.51  Sodium 879 - 145 mmol/L 131(L) 132(L)  Potassium 3.5 - 5.1 mmol/L 4.3 4.6  Chloride 98 - 111 mmol/L 96(L) 97(L)  CO2 22 - 32 mmol/L 26 27  Calcium 8.9 - 10.3 mg/dL 9.4 9.2  Total Protein 6.5 - 8.1 g/dL 7.1  7.5  Total Bilirubin 0.3 - 1.2 mg/dL 0.5 0.7  Alkaline Phos 38 - 126 U/L 76 81  AST 15 - 41 U/L 32 22  ALT 0 - 44 U/L 26 18    DIAGNOSTIC IMAGING:  I have independently reviewed the scans and discussed with the Antonio Payne. MR Brain W Wo Contrast  Result Date: 12/24/2020 CLINICAL DATA:  Metastatic non-small cell lung cancer EXAM: MRI HEAD WITHOUT AND WITH CONTRAST TECHNIQUE: Multiplanar, multiecho pulse sequences of the brain and surrounding structures were obtained without and with intravenous contrast. CONTRAST:  42mL GADAVIST GADOBUTROL  1 MMOL/ML IV SOLN COMPARISON:  03/23/2020.  09/02/2019.  05/27/2019. CT 02/27/2017. FINDINGS: Brain: The brain has a normal appearance without evidence of malformation, atrophy, old or acute small or large vessel infarction, mass lesion, hemorrhage, hydrocephalus or extra-axial collection. After contrast administration, Antonio abnormal enhancement occurs. Vascular: Major vessels at the base of the brain show flow. Venous sinuses appear patent. Skull and upper cervical spine: Antonio abnormality of the calvarium or skull base. Chronically seen sclerotic focus within the C3 vertebral body, grossly the same when compared to studies as distant as 02/27/2017. Therefore, this probably does not represent active metastatic disease. Sinuses/Orbits: Clear/normal. Other: Presumed sebaceous cyst of the left frontal scalp. IMPRESSION: 1. Normal appearance of the brain itself. Antonio evidence of metastatic disease. 2. Chronically seen sclerotic focus within the C3 vertebral body, grossly the same when compared to studies as distant as 02/27/2017. Therefore, this probably does not represent active or viable metastatic disease. Electronically Signed   By: Nelson Chimes M.D.   On: 12/24/2020 18:06   CT CHEST ABDOMEN PELVIS W CONTRAST  Result Date: 12/25/2020 CLINICAL DATA:  65 year old male with history of non-small cell lung cancer with metastatic disease. Evaluate for response to therapy. EXAM: CT  CHEST, ABDOMEN, AND PELVIS WITH CONTRAST TECHNIQUE: Multidetector CT imaging of the chest, abdomen and pelvis was performed following the standard protocol during bolus administration of intravenous contrast. CONTRAST:  158mL OMNIPAQUE IOHEXOL 300 MG/ML  SOLN COMPARISON:  Outside chest CT 04/08/2020. FINDINGS: CT CHEST FINDINGS Cardiovascular: Heart size is normal. There is Antonio significant pericardial fluid, thickening or pericardial calcification. There is aortic atherosclerosis, as well as atherosclerosis of the great vessels of the mediastinum and the coronary arteries, including calcified atherosclerotic plaque in the left main, left anterior descending, left circumflex and right coronary arteries. Mediastinum/Nodes: Antonio pathologically enlarged mediastinal or hilar lymph nodes. Esophagus is unremarkable in appearance. Antonio axillary lymphadenopathy. Lungs/Pleura: In the right upper lobe (axial image 44 of series 3 and coronal image 67 of series 4) there is a 3.1 x 1.2 x 2.9 cm macrolobulated mass with spiculated margins which appears relatively similar in size compared to the prior study from April 08, 2020, and is surrounded by linear areas of architectural distortion which are likely to reflect chronic post treatment related changes of fibrosis. Previously noted area of dense airspace consolidation in the posterior aspect of the right upper lobe has resolved, as has the additional areas of faint airspace consolidation elsewhere in the right upper and lower lobes, presumably a resolved pneumonia or resolved postradiation pneumonitis. A few scattered tiny 3-4 mm pulmonary nodules are noted in the periphery of the lungs, stable compared to the prior study, nonspecific, but statistically likely benign. Antonio other new suspicious appearing pulmonary nodules or masses are noted. Antonio new acute consolidative airspace disease. Antonio pleural effusions. Diffuse bronchial wall thickening with mild to moderate centrilobular and  paraseptal emphysema. Musculoskeletal: There are Antonio aggressive appearing lytic or blastic lesions noted in the visualized portions of the skeleton. CT ABDOMEN PELVIS FINDINGS Hepatobiliary: Antonio discrete cystic or. Solid Antonio intra or hepatic lesions extrahepatic biliary ductal dilatation. Gallbladder is normal in appearance. Pancreas: Antonio pancreatic mass. Antonio pancreatic ductal dilatation. Antonio pancreatic or peripancreatic fluid collections or inflammatory changes. Spleen: Unremarkable. Adrenals/Urinary Tract: Bilateral kidneys and the right adrenal gland are normal in appearance. Some adreniform thickening of the left adrenal gland is noted, stable compared to the prior study, presumably reflective of adrenal hyperplasia. Antonio hydroureteronephrosis. Urinary bladder is normal in appearance.  Stomach/Bowel: Normal appearance of the stomach. Antonio pathologic dilatation of small bowel or colon. Normal appendix. Vascular/Lymphatic: Aortic atherosclerosis, without evidence of aneurysm or dissection in the abdominal or pelvic vasculature. Antonio lymphadenopathy noted in the abdomen or pelvis. Reproductive: Prostate gland and seminal vesicles are unremarkable in appearance. Other: Small umbilical hernia containing only omental fat. Antonio significant volume of ascites. Antonio pneumoperitoneum. Musculoskeletal: There are Antonio aggressive appearing lytic or blastic lesions noted in the visualized portions of the skeleton. IMPRESSION: 1. Previously noted right upper lobe mass is stable in size compared to the prior study, likely to represent a treated lesion, with some surrounding postradiation changes. Antonio definitive findings to suggest new metastatic disease elsewhere in the chest, abdomen or pelvis. 2. Small 3-4 mm pulmonary nodules in the lungs bilaterally, nonspecific, but similar to the prior study and statistically likely to be benign. Attention at time of routine follow-up imaging is recommended to ensure stability. 3. Stable adreniform thickening  of the left adrenal gland, likely reflective of adrenal hyperplasia. Attention on follow-up studies is recommended to ensure continued stability. 4. Small umbilical hernia containing only omental fat. Antonio associated bowel incarceration or obstruction at this time. 5. Aortic atherosclerosis, in addition to left main and 3 vessel coronary artery disease. Please note that although the presence of coronary artery calcium documents the presence of coronary artery disease, the severity of this disease and any potential stenosis cannot be assessed on this non-gated CT examination. Assessment for potential risk factor modification, dietary therapy or pharmacologic therapy may be warranted, if clinically indicated. 6. Mild diffuse bronchial wall thickening with mild to moderate centrilobular and paraseptal emphysema; imaging findings suggestive of underlying COPD. Aortic Atherosclerosis (ICD10-I70.0) and Emphysema (ICD10-J43.9). Electronically Signed   By: Trudie Reed M.D.   On: 12/25/2020 07:02     ASSESSMENT:  1.  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 -Mutations were negative for EGFR, ALK, ROS1, RET, BRAF V600 -PD-L1 22 C3 greater than 95%. -He was started on single agent pembrolizumab every 3 weeks under the direction of Dr. Alain Marion in [place] in August 2020, later switched to every 6 weeks. -We will consider testing for K-ras G 12 C, NTR K and met exon 14 mutations upon progression.  2.  Social/family history: -He is a retired Naval architect.  Quit smoking in August 2020. -Father had prostate cancer.   PLAN:  1.  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 -CBC and LFTs are within normal limits.  TSH was 2.286. -Reviewed CT CAP from 12/24/2020 which showed right upper lobe  mass is stable in size compared to prior study, with some postradiation changes.  Antonio new metastatic disease in the chest, abdomen or pelvis.  Small 3 to 4 mm pulmonary nodules bilaterally are stable.  Slight thickening of the left adrenal gland is also stable. -We will proceed with pembrolizumab today. -RTC 6 weeks for follow-up.  2.  Brain metastasis: -He did not have any prior radiation therapy. -MRI of the brain on 12/24/2020 shows normal appearance of the brain with Antonio evidence of metastatic disease.  Chronically seen sclerotic focus within the C3 vertebral body grossly the same from 2018.  3.  COPD: -Continue Breo Ellipta once daily and Ventolin as needed.  Refills given today.   Orders placed this  encounter:  Antonio orders of the defined types were placed in this encounter.    [name], MD [place] [phone]   I, Milinda Antis, am acting as a scribe for Dr. Sanda Linger.  I, [name] MD, have reviewed the above documentation for accuracy and completeness, and I agree with the above.

## 2020-12-29 NOTE — Progress Notes (Signed)
Patient was assessed by Dr. Katragadda and labs have been reviewed.  Patient is okay to proceed with treatment today. Primary RN and pharmacy aware.   

## 2020-12-29 NOTE — Patient Instructions (Signed)
[facility] Discharge Instructions for Patients Receiving Chemotherapy  Today you received the following chemotherapy agents Keytruda  To help prevent nausea and vomiting after your treatment, we encourage you to take your nausea medication .   If you develop nausea and vomiting that is not controlled by your nausea medication, call the clinic.   BELOW ARE SYMPTOMS THAT SHOULD BE REPORTED IMMEDIATELY:  *FEVER GREATER THAN 100.5 F  *CHILLS WITH OR WITHOUT FEVER  NAUSEA AND VOMITING THAT IS NOT CONTROLLED WITH YOUR NAUSEA MEDICATION  *UNUSUAL SHORTNESS OF BREATH  *UNUSUAL BRUISING OR BLEEDING  TENDERNESS IN MOUTH AND THROAT WITH OR WITHOUT PRESENCE OF ULCERS  *URINARY PROBLEMS  *BOWEL PROBLEMS  UNUSUAL RASH Items with * indicate a potential emergency and should be followed up as soon as possible.  Feel free to call the clinic should you have any questions or concerns. The clinic phone number is [phone].  Please show the [place] at check-in to the Emergency Department and triage nurse.

## 2020-12-29 NOTE — Progress Notes (Signed)
Patient presents today for treatment and follow up visit with Dr. Delton Coombes. Message received from [facility] lpn / Dr. Delton Coombes to proceed with treatment.   Keytruda given today per MD orders. Tolerated infusion without adverse affects. Vital signs stable. No complaints at this time. Discharged from clinic ambulatory in stable condition. Alert and oriented x 3. F/U with [facility] as scheduled.

## 2021-02-09 ENCOUNTER — Inpatient Hospital Stay (HOSPITAL_COMMUNITY): Payer: Medicare Other | Attending: Hematology

## 2021-02-09 ENCOUNTER — Other Ambulatory Visit: Payer: Self-pay

## 2021-02-09 ENCOUNTER — Inpatient Hospital Stay (HOSPITAL_COMMUNITY): Payer: Medicare Other

## 2021-02-09 ENCOUNTER — Inpatient Hospital Stay (HOSPITAL_BASED_OUTPATIENT_CLINIC_OR_DEPARTMENT_OTHER): Payer: Medicare Other | Admitting: Hematology

## 2021-02-09 VITALS — BP 126/73 | HR 84 | Temp 97.0°F | Resp 18 | Wt 153.1 lb

## 2021-02-09 VITALS — BP 108/72 | HR 64 | Temp 97.0°F | Resp 18

## 2021-02-09 DIAGNOSIS — C7931 Secondary malignant neoplasm of brain: Secondary | ICD-10-CM | POA: Insufficient documentation

## 2021-02-09 DIAGNOSIS — C3491 Malignant neoplasm of unspecified part of right bronchus or lung: Secondary | ICD-10-CM

## 2021-02-09 DIAGNOSIS — Z5112 Encounter for antineoplastic immunotherapy: Secondary | ICD-10-CM | POA: Insufficient documentation

## 2021-02-09 DIAGNOSIS — C3411 Malignant neoplasm of upper lobe, right bronchus or lung: Secondary | ICD-10-CM

## 2021-02-09 DIAGNOSIS — Z79899 Other long term (current) drug therapy: Secondary | ICD-10-CM | POA: Insufficient documentation

## 2021-02-09 DIAGNOSIS — J449 Chronic obstructive pulmonary disease, unspecified: Secondary | ICD-10-CM | POA: Diagnosis not present

## 2021-02-09 LAB — MAGNESIUM: Magnesium: 2 mg/dL (ref 1.7–2.4)

## 2021-02-09 LAB — COMPREHENSIVE METABOLIC PANEL
ALT: 20 U/L (ref 0–44)
AST: 21 U/L (ref 15–41)
Albumin: 4 g/dL (ref 3.5–5.0)
Alkaline Phosphatase: 84 U/L (ref 38–126)
Anion gap: 7 (ref 5–15)
BUN: 13 mg/dL (ref 8–23)
CO2: 27 mmol/L (ref 22–32)
Calcium: 9 mg/dL (ref 8.9–10.3)
Chloride: 97 mmol/L — ABNORMAL LOW (ref 98–111)
Creatinine, Ser: 0.7 mg/dL (ref 0.61–1.24)
GFR, Estimated: >60 mL/min (ref >60)
Glucose, Bld: 97 mg/dL (ref 70–99)
Potassium: 4.1 mmol/L (ref 3.5–5.1)
Sodium: 131 mmol/L — ABNORMAL LOW (ref 135–145)
Total Bilirubin: 0.5 mg/dL (ref 0.3–1.2)
Total Protein: 6.8 g/dL (ref 6.5–8.1)

## 2021-02-09 LAB — CBC WITH DIFFERENTIAL/PLATELET
Abs Immature Granulocytes: 0.02 10*3/uL (ref 0.00–0.07)
Basophils Absolute: 0.1 10*3/uL (ref 0.0–0.1)
Basophils Relative: 1 %
Eosinophils Absolute: 0.5 10*3/uL (ref 0.0–0.5)
Eosinophils Relative: 7 %
HCT: 40.3 % (ref 39.0–52.0)
Hemoglobin: 13.3 g/dL (ref 13.0–17.0)
Immature Granulocytes: 0 %
Lymphocytes Relative: 19 %
Lymphs Abs: 1.4 10*3/uL (ref 0.7–4.0)
MCH: 31.7 pg (ref 26.0–34.0)
MCHC: 33 g/dL (ref 30.0–36.0)
MCV: 96 fL (ref 80.0–100.0)
Monocytes Absolute: 0.7 10*3/uL (ref 0.1–1.0)
Monocytes Relative: 9 %
Neutro Abs: 4.7 10*3/uL (ref 1.7–7.7)
Neutrophils Relative %: 64 %
Platelets: 251 10*3/uL (ref 150–400)
RBC: 4.2 MIL/uL — ABNORMAL LOW (ref 4.22–5.81)
RDW: 13.2 % (ref 11.5–15.5)
WBC: 7.4 10*3/uL (ref 4.0–10.5)
nRBC: 0 % (ref 0.0–0.2)

## 2021-02-09 LAB — TSH: TSH: 1.268 u[IU]/mL (ref 0.350–4.500)

## 2021-02-09 MED ORDER — SODIUM CHLORIDE 0.9 % IV SOLN
Freq: Once | INTRAVENOUS | Status: AC
Start: 2021-02-09 — End: 2021-02-09

## 2021-02-09 MED ORDER — SODIUM CHLORIDE 0.9 % IV SOLN
400.0000 mg | Freq: Once | INTRAVENOUS | Status: AC
  Administered 2021-02-09: 400 mg via INTRAVENOUS
  Filled 2021-02-09: qty 16

## 2021-02-09 NOTE — Progress Notes (Signed)
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  PRIOR THERAPY: none  NGS Results: PD-L1 TPS >95%; EGFR, ALK, ROS1, RET, BRAF V600E negative  CURRENT THERAPY: Keytruda every 6 weeks  BRIEF ONCOLOGIC HISTORY:  Oncology History  Adenocarcinoma of lung, stage 4, right ([place])  12/08/2020 Initial Diagnosis   Adenocarcinoma of lung, stage 4, right ([place])   12/08/2020 Cancer Staging   Staging form: Lung, AJCC 8th Edition - Clinical stage from 12/08/2020: Stage IVB (cT2b, cN3, pM1c) - Signed by [name], MD on 12/08/2020 Histopathologic type: Adenocarcinoma, NOS   12/29/2020 -  Chemotherapy    Patient is on Treatment Plan: LUNG PEMBROLIZUMAB Q42D        CANCER STAGING: Cancer Staging Adenocarcinoma of lung, stage 4, right [facility]) Staging form: Lung, AJCC 8th Edition - Clinical stage from 12/08/2020: Stage IVB (cT2b, cN3, pM1c) - Signed by [name], MD on 12/08/2020   INTERVAL HISTORY:  Antonio Payne, a 65 y.o. male, returns for routine follow-up and consideration for next cycle of immunotherapy. Antonio Payne was last seen on 12/29/2020.  Due for cycle #2 of Pembrolizumad today.   Overall, he tells me he has been feeling pretty well. He denies any SOB, skin rashes, n/v/d. He reports decent appetite (2-3 meals daily)or any difficulty swallowing. He admits to not adding salt to his food. He is now retired.   Overall, he feels ready for next cycle of immunotherapy today.   REVIEW OF SYSTEMS:  Review of Systems  Constitutional: Positive for appetite change (75%) and fatigue (75%).  HENT:   Negative for trouble swallowing.   Respiratory: Negative for shortness of breath.   Gastrointestinal: Negative for diarrhea, nausea and vomiting.  Skin: Negative for rash.  All other systems  reviewed and are negative.   PAST MEDICAL/SURGICAL HISTORY:  No past medical history on file. ** The histories are not reviewed yet. Please review them in the "History" navigator section and refresh this [place].  SOCIAL HISTORY:  Social History   Socioeconomic History  . Marital status: Single    Spouse name: Not on file  . Number of children: Not on file  . Years of education: Not on file  . Highest education level: Not on file  Occupational History  . Not on file  Tobacco Use  . Smoking status: Former Smoker    Types: Cigarettes    Quit date: 12/01/2019    Years since quitting: 1.1  . Smokeless tobacco: Never Used  Substance and Sexual Activity  . Alcohol use: Never  . Drug use: Not on file  . Sexual activity: Not Currently  Other Topics Concern  . Not on file  Social History Narrative  . Not on file   Social Determinants of Health   Financial Resource Strain: Low Risk   . Difficulty of Paying Living Expenses: Not hard at all  Food Insecurity: No Food Insecurity  . Worried About Charity fundraiser in the Last Year: Never true  . Ran Out of Food in the Last Year: Never true  Transportation Needs: No Transportation Needs  . Lack of Transportation (Medical): No  . Lack of Transportation (Non-Medical): No  Physical Activity: Sufficiently Active  . Days of Exercise per Week: 7 days  . Minutes of Exercise per Session: 30 min  Stress:  No Stress Concern Present  . Feeling of Stress : Only a little  Social Connections: Moderately Integrated  . Frequency of Communication with Friends and Family: More than three times a week  . Frequency of Social Gatherings with Friends and Family: More than three times a week  . Attends Religious Services: 1 to 4 times per year  . Active Member of Clubs or Organizations: No  . Attends Archivist Meetings: 1 to 4 times per year  . Marital Status: Divorced  Human resources officer Violence: Not At Risk  . Fear of Current or  Ex-Partner: No  . Emotionally Abused: No  . Physically Abused: No  . Sexually Abused: No    FAMILY HISTORY:  No family history on file.  CURRENT MEDICATIONS:  Current Outpatient Medications  Medication Sig Dispense Refill  . albuterol (VENTOLIN HFA) 108 (90 Base) MCG/ACT inhaler Inhale 2 puffs into the lungs every 4 (four) hours as needed for wheezing or shortness of breath. 8 g 6  . fluticasone furoate-vilanterol (BREO ELLIPTA) 100-25 MCG/INH AEPB Inhale 1 puff into the lungs daily. 60 each 6   No current facility-administered medications for this visit.    ALLERGIES:  Not on File  PHYSICAL EXAM:  Performance status (ECOG): 1 - Symptomatic but completely ambulatory  There were no vitals filed for this visit. Wt Readings from Last 3 Encounters:  12/29/20 152 lb (68.9 kg)  12/08/20 151 lb 9.6 oz (68.8 kg)   Physical Exam Vitals reviewed.  Constitutional:      Appearance: Normal appearance.  Cardiovascular:     Rate and Rhythm: Normal rate and regular rhythm.     Pulses: Normal pulses.     Heart sounds: Normal heart sounds.  Pulmonary:     Effort: Pulmonary effort is normal.     Breath sounds: Normal breath sounds.  Chest:  Breasts:     Right: No axillary adenopathy or supraclavicular adenopathy.     Left: No axillary adenopathy or supraclavicular adenopathy.    Abdominal:     Palpations: Abdomen is soft. There is no hepatomegaly, splenomegaly or mass.     Tenderness: There is no abdominal tenderness.  Musculoskeletal:     Right lower leg: No edema.     Left lower leg: No edema.  Lymphadenopathy:     Upper Body:     Right upper body: No supraclavicular, axillary or pectoral adenopathy.     Left upper body: No supraclavicular, axillary or pectoral adenopathy.  Neurological:     General: No focal deficit present.     Mental Status: He is alert and oriented to person, place, and time.  Psychiatric:        Mood and Affect: Mood normal.        Behavior: Behavior  normal.     LABORATORY DATA:  I have reviewed the labs as listed.  CBC Latest Ref Rng & Units 02/09/2021 12/29/2020 12/08/2020  WBC 4.0 - 10.5 K/uL 7.4 6.8 6.5  Hemoglobin 13.0 - 17.0 g/dL 13.3 14.7 14.5  Hematocrit 39.0 - 52.0 % 40.3 44.3 44.3  Platelets 150 - 400 K/uL 251 271 225   CMP Latest Ref Rng & Units 12/29/2020 12/08/2020  Glucose 70 - 99 mg/dL 101(H) 100(H)  BUN 8 - 23 mg/dL 17 14  Creatinine 0.61 - 1.24 mg/dL 0.79 0.85  Sodium 135 - 145 mmol/L 131(L) 132(L)  Potassium 3.5 - 5.1 mmol/L 4.3 4.6  Chloride 98 - 111 mmol/L 96(L) 97(L)  CO2 22 - 32  mmol/L 26 27  Calcium 8.9 - 10.3 mg/dL 9.4 9.2  Total Protein 6.5 - 8.1 g/dL 7.1 7.5  Total Bilirubin 0.3 - 1.2 mg/dL 0.5 0.7  Alkaline Phos 38 - 126 U/L 76 81  AST 15 - 41 U/L 32 22  ALT 0 - 44 U/L 26 18    DIAGNOSTIC IMAGING:  I have independently reviewed the scans and discussed with the patient. No results found.   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 -Mutations were negative for EGFR, ALK, ROS1,RET, BRAF V600 -PD-L1 22 C3 greater than 95%. -He was started on single agent pembrolizumab every 3 weeks under the direction of Dr. Dola Factor in [place] in August 2020, later switched to every 6 weeks. -We will consider testing for K-ras G 12 C, NTR K and met exon 14 mutations upon progression.  2. Social/family history: -He is a retired Administrator. Quit smoking in August 2020. -Father had prostate cancer.  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  No new metastatic disease in the chest, abdomen or pelvis.  Small 3 to 4 mm pulmonary nodules bilaterally are stable. - He has tolerated last cycle of  immunotherapy very well.  He does not report any immunotherapy related side effects.  His weight is stable. - Reviewed labs from today which showed normal LFTs, magnesium and other electrolytes.  CBC was grossly normal.  TSH was 1.26. - Proceed with Keytruda today.  RTC 6 months for follow-up.  2. Brain metastasis: -He did not have any prior radiation therapy. - MRI of the brain on 12/24/2020 showed no evidence of metastatic disease.  Chronically seen sclerotic focus within the C3 vertebral body grossly the same from 2018.  3.  COPD: -Continue Breo Ellipta once daily and Ventolin as needed.   Orders placed this encounter:  No orders of the defined types were placed in this encounter.    [name], MD [place] [phone]   I, Thana Ates, am acting as a scribe for Dr. Sanda Linger.  I, [name] MD, have reviewed the above documentation for accuracy and completeness, and I agree with the above.

## 2021-02-09 NOTE — Patient Instructions (Signed)
[place] at [facility] Discharge Instructions  You were seen today by Dr. Delton Coombes. He went over your recent results. You received your treatment. If you develop sudden shortness of breath, difficulty breathing, or water diarrhea calll theoffice. Dr. Delton Coombes will see you back in 6 weeks for labs and follow up.   Thank you for choosing [place] at [facility] to provide your oncology and hematology care.  To afford each patient quality time with our provider, please arrive at least 15 minutes before your scheduled appointment time.   If you have a lab appointment with the [facility] please come in thru the Main Entrance and check in at the main information desk  You need to re-schedule your appointment should you arrive 10 or more minutes late.  We strive to give you quality time with our providers, and arriving late affects you and other patients whose appointments are after yours.  Also, if you no show three or more times for appointments you may be dismissed from the clinic at the providers discretion.     Again, thank you for choosing [facility].  Our hope is that these requests will decrease the amount of time that you wait before being seen by our physicians.       _____________________________________________________________  Should you have questions after your visit to [facility], please contact our office at [phone] between the hours of 8:00 a.m. and 4:30 p.m.  Voicemails left after 4:00 p.m. will not be returned until the following business day.  For prescription refill requests, have your pharmacy contact our office and allow 72 hours.    Cancer Center Support Programs:   > Cancer Support Group  2nd Tuesday of the month 1pm-2pm, Journey Room

## 2021-02-09 NOTE — Progress Notes (Signed)
Assessment completed and labs reviewed by Dr Delton Coombes.  No acute distress noted.  Okay for treatment today.

## 2021-02-09 NOTE — Patient Instructions (Signed)
[facility]  Discharge Instructions: Thank you for choosing [facility] to provide your oncology and hematology care.  If you have a lab appointment with the [facility], please come in thru the Main Entrance and check in at the main information desk.  Wear comfortable clothing and clothing appropriate for easy access to any Portacath or PICC line.   We strive to give you quality time with your provider. You may need to reschedule your appointment if you arrive late (15 or more minutes).  Arriving late affects you and other patients whose appointments are after yours.  Also, if you miss three or more appointments without notifying the office, you may be dismissed from the clinic at the provider's discretion.      For prescription refill requests, have your pharmacy contact our office and allow 72 hours for refills to be completed.    Today you received the following immunotherapy-Keytruda   To help prevent nausea and vomiting after your treatment, we encourage you to take your nausea medication as directed.  BELOW ARE SYMPTOMS THAT SHOULD BE REPORTED IMMEDIATELY: . *FEVER GREATER THAN 100.4 F (38 C) OR HIGHER . *CHILLS OR SWEATING . *NAUSEA AND VOMITING THAT IS NOT CONTROLLED WITH YOUR NAUSEA MEDICATION . *UNUSUAL SHORTNESS OF BREATH . *UNUSUAL BRUISING OR BLEEDING . *URINARY PROBLEMS (pain or burning when urinating, or frequent urination) . *BOWEL PROBLEMS (unusual diarrhea, constipation, pain near the anus) . TENDERNESS IN MOUTH AND THROAT WITH OR WITHOUT PRESENCE OF ULCERS (sore throat, sores in mouth, or a toothache) . UNUSUAL RASH, SWELLING OR PAIN  . UNUSUAL VAGINAL DISCHARGE OR ITCHING   Items with * indicate a potential emergency and should be followed up as soon as possible or go to the Emergency Department if any problems should occur.  Please show the CHEMOTHERAPY ALERT CARD or IMMUNOTHERAPY ALERT CARD at check-in to the Emergency Department and  triage nurse.  Should you have questions after your visit or need to cancel or reschedule your appointment, please contact [facility] [phone]  and follow the prompts.  Office hours are 8:00 a.m. to 4:30 p.m. Monday - Friday. Please note that voicemails left after 4:00 p.m. may not be returned until the following business day.  We are closed weekends and major holidays. You have access to a nurse at all times for urgent questions. Please call the main number to the clinic [phone] and follow the prompts.  For any non-urgent questions, you may also contact your provider using MyChart. We now offer e-Visits for anyone 35 and older to request care online for non-urgent symptoms. For details visit [email or web address].   Also download the MyChart app! Go to the app store, search "MyChart", open the app, select [facility], and log in with your MyChart username and password.  Due to Covid, a mask is required upon entering the hospital/clinic. If you do not have a mask, one will be given to you upon arrival. For doctor visits, patients may have 1 support person aged 77 or older with them. For treatment visits, patients cannot have anyone with them due to current Covid guidelines and our immunocompromised population.

## 2021-02-09 NOTE — Progress Notes (Signed)
Labs reviewed, ok to treat per MD.   Treatment given per orders. Patient tolerated it well without problems. Vitals stable and discharged home from clinic ambulatory. Follow up as scheduled.

## 2021-02-16 ENCOUNTER — Ambulatory Visit
Admission: EM | Admit: 2021-02-16 | Discharge: 2021-02-16 | Disposition: A | Discharge status: Home / Self Care | Payer: Medicare Other | Attending: Family Medicine | Admitting: Family Medicine

## 2021-02-16 ENCOUNTER — Encounter: Payer: Self-pay | Admitting: Emergency Medicine

## 2021-02-16 ENCOUNTER — Other Ambulatory Visit: Payer: Self-pay

## 2021-02-16 ENCOUNTER — Ambulatory Visit (INDEPENDENT_AMBULATORY_CARE_PROVIDER_SITE_OTHER): Payer: Medicare Other

## 2021-02-16 DIAGNOSIS — J069 Acute upper respiratory infection, unspecified: Secondary | ICD-10-CM

## 2021-02-16 DIAGNOSIS — J209 Acute bronchitis, unspecified: Secondary | ICD-10-CM

## 2021-02-16 DIAGNOSIS — R059 Cough, unspecified: Secondary | ICD-10-CM | POA: Diagnosis not present

## 2021-02-16 DIAGNOSIS — Z85118 Personal history of other malignant neoplasm of bronchus and lung: Secondary | ICD-10-CM | POA: Diagnosis not present

## 2021-02-16 DIAGNOSIS — R0602 Shortness of breath: Secondary | ICD-10-CM | POA: Diagnosis not present

## 2021-02-16 HISTORY — DX: Malignant (primary) neoplasm, unspecified: C80.1

## 2021-02-16 IMAGING — DX DG CHEST 2V
2 series · 2 of 2 positions shown · non-contrast
Comparison: [DATE] chest radiograph and chest CT [DATE]

CLINICAL DATA: Shortness of breath and cough

EXAM:
CHEST - 2 VIEW

[chest pa]
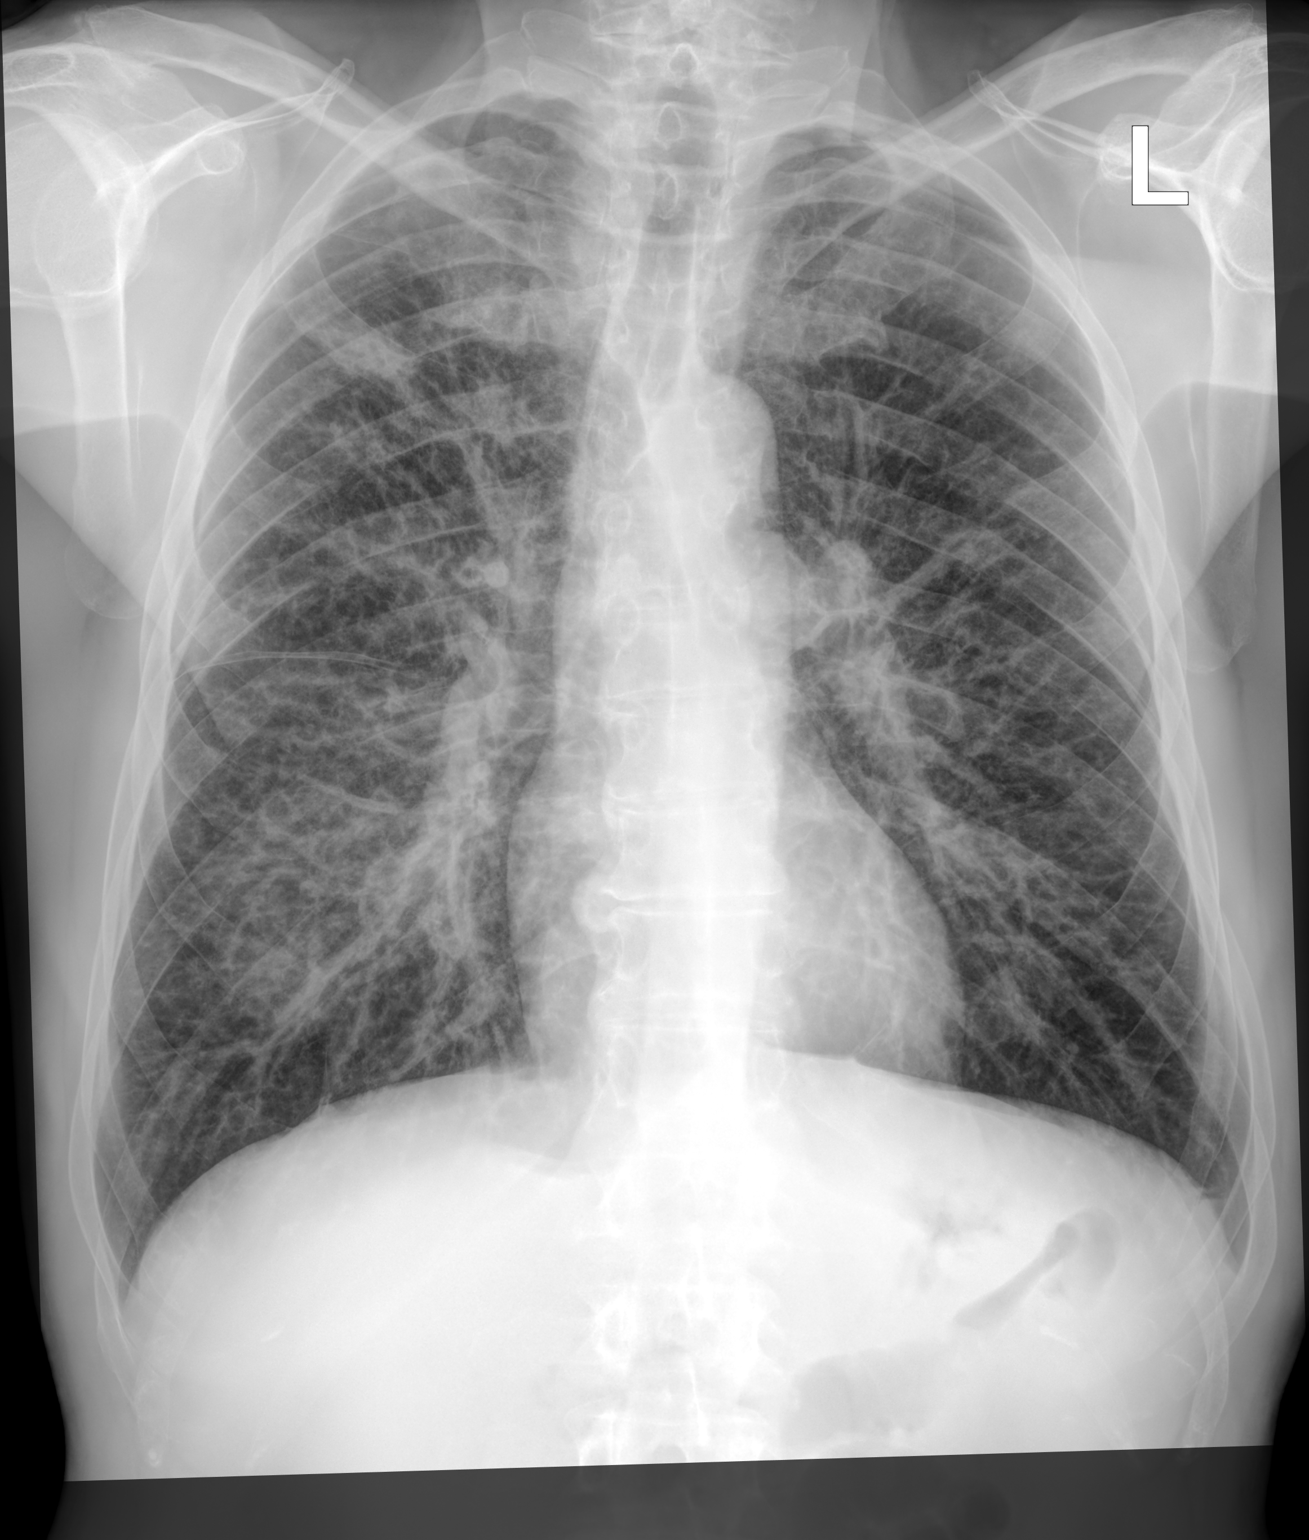

[chest lat]
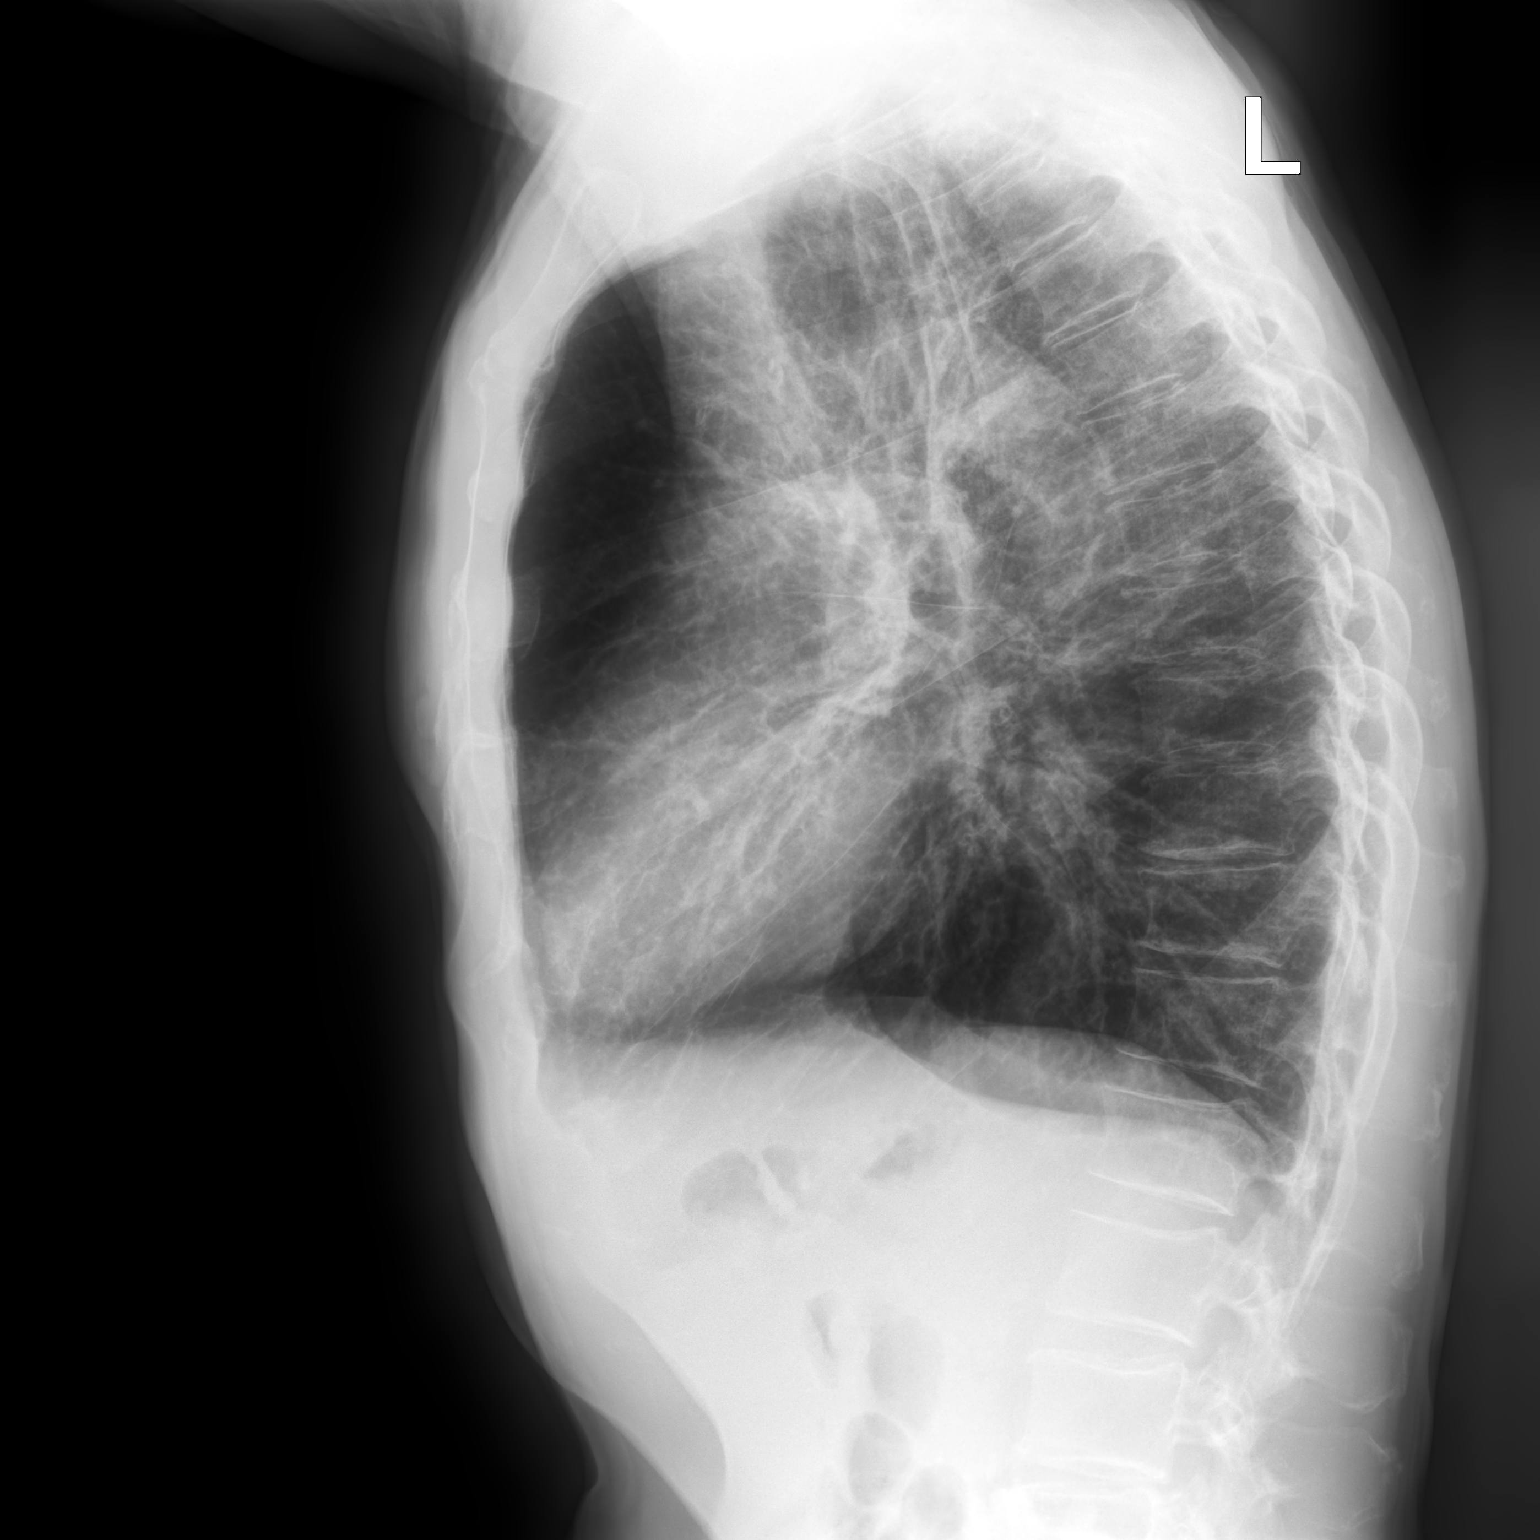

[2 of 2 positions shown; findings below may reference images not displayed]

FINDINGS: Ill-defined opacity in the right upper lobe noted on prior CT to
represent GAIJIN present currently measuring 2.2 x 1.2 cm. This
area appears smaller than on prior CT. There is underlying
interstitial thickening throughout the lungs. Heart size and
pulmonary vascularity are normal. No adenopathy. No evident bone
lesions.
IMPRESSION: Ill-defined area of opacity which appeared more masslike and larger
on prior CT is present, currently measuring 2.3 x 1.2 cm. No similar
opacities of this nature elsewhere. Underlying interstitial
thickening likely reflects a degree of chronic bronchitis. No
airspace consolidation. Heart size normal. No adenopathy appreciable
by radiography.

## 2021-02-16 MED ORDER — DOXYCYCLINE HYCLATE 100 MG PO CAPS: 100.0000 mg | ORAL_CAPSULE | Freq: Two times a day (BID) | ORAL | 0 refills | Status: DC

## 2021-02-16 MED ORDER — METHYLPREDNISOLONE SODIUM SUCC 125 MG IJ SOLR
125.0000 mg | Freq: Once | INTRAMUSCULAR | Status: AC
  Administered 2021-02-16: 125 mg via INTRAMUSCULAR

## 2021-02-16 MED ORDER — GUAIFENESIN-CODEINE 100-10 MG/5ML PO SYRP
5.0000 mL | ORAL_SOLUTION | Freq: Three times a day (TID) | ORAL | 0 refills | Status: DC | PRN
Start: 2021-02-16 — End: 2021-02-25

## 2021-02-16 MED ORDER — PREDNISONE 10 MG (21) PO TBPK: ORAL_TABLET | Freq: Every day | ORAL | 0 refills | Status: DC

## 2021-02-16 NOTE — ED Triage Notes (Signed)
Cough and shortness of breath since Monday

## 2021-02-16 NOTE — ED Provider Notes (Signed)
[facility]    CSN: [id number] Arrival date & time: 02/16/21  0906      History   Chief Complaint Chief Complaint  Patient presents with  . Cough    HPI Antonio Payne is a 65 y.o. male.   Reports cough and shortness of breath for the last 2 days.  Has been taking over-the-counter cough and cold with little relief.  Has history of stage IV lung cancer as well.  Has history of pneumonia on the right side last year.  Reports negative home COVID test today.  Has negative history of COVID.  Has not completed COVID vaccines.  Has completed flu vaccine.  Denies abdominal pain, nausea, vomiting, diarrhea, rash, fever, other symptoms.  ROS per HPI  The history is provided by the patient.  Cough   Past Medical History:  Diagnosis Date  . Cancer [facility])    lung cancer     Patient Active Problem List   Diagnosis Date Noted  . Adenocarcinoma of lung, stage 4, right ([place]) 12/08/2020    Past Surgical History:  Procedure Laterality Date  . BACK SURGERY         Home Medications    Prior to Admission medications   Medication Sig Start Date End Date Taking? Authorizing Provider  doxycycline (VIBRAMYCIN) 100 MG capsule Take 1 capsule (100 mg total) by mouth 2 (two) times daily. 02/16/21  Yes Faustino Congress, NP  guaiFENesin-codeine (ROBITUSSIN AC) 100-10 MG/5ML syrup Take 5 mLs by mouth 3 (three) times daily as needed for cough. 02/16/21  Yes Faustino Congress, NP  predniSONE (STERAPRED UNI-PAK 21 TAB) 10 MG (21) TBPK tablet Take by mouth daily. Take 6 tabs by mouth daily  for 2 days, then 5 tabs for 2 days, then 4 tabs for 2 days, then 3 tabs for 2 days, 2 tabs for 2 days, then 1 tab by mouth daily for 2 days 02/16/21  Yes Faustino Congress, NP  albuterol (VENTOLIN HFA) 108 (90 Base) MCG/ACT inhaler Inhale 2 puffs into the lungs every 4 (four) hours as needed for wheezing or shortness of breath. Patient not taking: Reported on 02/09/2021 12/29/20   [name], MD  fluticasone furoate-vilanterol (BREO ELLIPTA) 100-25 MCG/INH AEPB Inhale 1 puff into the lungs daily. 12/29/20   [name], MD    Family History No family history on file.  Social History Social History   Tobacco Use  . Smoking status: Former Smoker    Types: Cigarettes    Quit date: 12/01/2019    Years since quitting: 1.2  . Smokeless tobacco: Never Used  Substance Use Topics  . Alcohol use: Never  . Drug use: Never     Allergies   Patient has no known allergies.   Review of Systems Review of Systems  Respiratory: Positive for cough.      Physical Exam Triage Vital Signs ED Triage Vitals  Enc Vitals Group     BP 02/16/21 0957 133/79     Pulse Rate 02/16/21 0957 81     Resp 02/16/21 0957 18     Temp 02/16/21 0957 98.2 F (36.8 C)     Temp Source 02/16/21 0957 Oral     SpO2 02/16/21 0957 95 %     Weight --      Height --      Head Circumference --      Peak Flow --      Pain Score 02/16/21 1009 0     Pain Loc --  Pain Edu? --      Excl. in [place]? --    No data found.  Updated Vital Signs BP 133/79 (BP Location: Right Arm)   Pulse 81   Temp 98.2 F (36.8 C) (Oral)   Resp 18   SpO2 95%       Physical Exam Vitals and nursing note reviewed.  Constitutional:      General: He is not in acute distress.    Appearance: He is well-developed. He is ill-appearing.  HENT:     Head: Normocephalic and atraumatic.     Nose: Congestion present.     Mouth/Throat:     Mouth: Mucous membranes are moist.     Pharynx: Posterior oropharyngeal erythema present.  Eyes:     Extraocular Movements: Extraocular movements intact.     Conjunctiva/sclera: Conjunctivae normal.     Pupils: Pupils are equal, round, and reactive to light.  Cardiovascular:     Rate and Rhythm: Normal rate and regular rhythm.     Heart sounds: Normal heart sounds. No murmur heard.   Pulmonary:     Effort: Pulmonary effort is normal. No respiratory distress.      Breath sounds: No stridor. Wheezing and rhonchi present.  Chest:     Chest wall: No tenderness.  Abdominal:     General: Bowel sounds are normal. There is no distension.     Palpations: Abdomen is soft. There is no mass.     Tenderness: There is no abdominal tenderness. There is no right CVA tenderness, left CVA tenderness, guarding or rebound.     Hernia: No hernia is present.  Musculoskeletal:        General: Normal range of motion.     Cervical back: Normal range of motion and neck supple.  Skin:    General: Skin is warm and dry.  Neurological:     General: No focal deficit present.     Mental Status: He is alert and oriented to person, place, and time.  Psychiatric:        Mood and Affect: Mood normal.        Thought Content: Thought content normal.      UC Treatments / Results  Labs (all labs ordered are listed, but only abnormal results are displayed) Labs Reviewed  COVID-19, FLU A+B NAA    EKG   Radiology DG Chest 2 View  Result Date: 02/16/2021 CLINICAL DATA:  Shortness of breath and cough EXAM: CHEST - 2 VIEW COMPARISON:  April 23, 2020 chest radiograph and chest CT December 24, 2020 FINDINGS: Ill-defined opacity in the right upper lobe noted on prior CT to represent a massis present currently measuring 2.2 x 1.2 cm. This area appears smaller than on prior CT. There is underlying interstitial thickening throughout the lungs. Heart size and pulmonary vascularity are normal. No adenopathy. No evident bone lesions. IMPRESSION: Ill-defined area of opacity which appeared more masslike and larger on prior CT is present, currently measuring 2.3 x 1.2 cm. No similar opacities of this nature elsewhere. Underlying interstitial thickening likely reflects a degree of chronic bronchitis. No airspace consolidation. Heart size normal. No adenopathy appreciable by radiography. Electronically Signed   By: Lowella Grip III M.D.   On: 02/16/2021 10:38    Procedures Procedures  (including critical care time)  Medications Ordered in UC Medications  methylPREDNISolone sodium succinate (SOLU-MEDROL) 125 mg/2 mL injection 125 mg (125 mg Intramuscular Given 02/16/21 1018)    Initial Impression / Assessment and Plan / UC  Course  I have reviewed the triage vital signs and the nursing notes.  Pertinent labs & imaging results that were available during my care of the patient were reviewed by me and considered in my medical decision making (see chart for details).     History of lung cancer URI  Bronchitis SOB  Solu-Medrol 125 mg given in office today Chest x-ray negative for pneumonia, do see the mass and compared it to previous CT Prescribed doxycycline 100 mg twice daily x10 days Prednisone taper prescribed Cheratussin cough syrup prescribed Sedation precautions given Covid and flu swab obtained in office today.   Patient instructed to quarantine until results are back and negative.   If results are negative, patient may resume daily schedule as tolerated once they are fever free for 24 hours without the use of antipyretic medications.   If results are positive, patient instructed to quarantine for at least 5 days from symptom onset.  If after 5 days symptoms have resolved, may return to work with a well fitting mask for the next 5 days. If symptomatic after day 5, isolation should be extended to 10 days. Patient instructed to follow-up with primary care or with this office as needed.   Patient instructed to follow-up in the ER for trouble swallowing, trouble breathing, other concerning symptoms.   Final Clinical Impressions(s) / UC Diagnoses   Final diagnoses:  SOB (shortness of breath)  Upper respiratory tract infection, unspecified type  Acute bronchitis, unspecified organism  History of lung cancer     Discharge Instructions     Chest x-ray is negative for pneumonia today  I have sent in doxycycline for you to take one tablet twice a day for 10  days  I have sent in cough syrup for you to take. This medication can make you sleepy. Do not drive while taking this medication.  I have sent in a steroid taper for you to take as well. Take 6 tablets for the first two days, take 5 tablets for day three and four, take 4 tablets for days five and six, take 3 tablets for days seven and eight, take 2 tablets for day nine and ten, then take 1 tablet for days eleven and twelve.  Your COVID and Influenza tests are pending.  You should self quarantine until the test results are back.    Take Tylenol or ibuprofen as needed for fever or discomfort.  Rest and keep yourself hydrated.    Follow-up with your primary care provider if your symptoms are not improving.         ED Prescriptions    Medication Sig Dispense Auth. Provider   doxycycline (VIBRAMYCIN) 100 MG capsule Take 1 capsule (100 mg total) by mouth 2 (two) times daily. 20 capsule Faustino Congress, NP   guaiFENesin-codeine (ROBITUSSIN AC) 100-10 MG/5ML syrup Take 5 mLs by mouth 3 (three) times daily as needed for cough. 120 mL Faustino Congress, NP   predniSONE (STERAPRED UNI-PAK 21 TAB) 10 MG (21) TBPK tablet Take by mouth daily. Take 6 tabs by mouth daily  for 2 days, then 5 tabs for 2 days, then 4 tabs for 2 days, then 3 tabs for 2 days, 2 tabs for 2 days, then 1 tab by mouth daily for 2 days 42 tablet Faustino Congress, NP     PDMP not reviewed this encounter.   Faustino Congress, NP 02/16/21 1048

## 2021-02-16 NOTE — Discharge Instructions (Addendum)
Chest x-ray is negative for pneumonia today  I have sent in doxycycline for you to take one tablet twice a day for 10 days  I have sent in cough syrup for you to take. This medication can make you sleepy. Do not drive while taking this medication.  I have sent in a steroid taper for you to take as well. Take 6 tablets for the first two days, take 5 tablets for day three and four, take 4 tablets for days five and six, take 3 tablets for days seven and eight, take 2 tablets for day nine and ten, then take 1 tablet for days eleven and twelve.  Your COVID and Influenza tests are pending.  You should self quarantine until the test results are back.    Take Tylenol or ibuprofen as needed for fever or discomfort.  Rest and keep yourself hydrated.    Follow-up with your primary care provider if your symptoms are not improving.

## 2021-02-17 LAB — COVID-19, FLU A+B NAA
Influenza A, NAA: NOT DETECTED
Influenza B, NAA: NOT DETECTED
SARS-CoV-2, NAA: NOT DETECTED

## 2021-02-25 ENCOUNTER — Emergency Department (HOSPITAL_COMMUNITY): Payer: Medicare Other

## 2021-02-25 ENCOUNTER — Encounter (HOSPITAL_COMMUNITY): Payer: Self-pay | Admitting: *Deleted

## 2021-02-25 ENCOUNTER — Emergency Department (HOSPITAL_COMMUNITY)
Admission: EM | Admit: 2021-02-25 | Discharge: 2021-02-25 | Disposition: A | Discharge status: Home / Self Care | Payer: Medicare Other | Attending: Emergency Medicine | Admitting: Emergency Medicine

## 2021-02-25 DIAGNOSIS — Z7951 Long term (current) use of inhaled steroids: Secondary | ICD-10-CM | POA: Diagnosis not present

## 2021-02-25 DIAGNOSIS — Z28310 Unvaccinated for covid-19: Secondary | ICD-10-CM | POA: Diagnosis not present

## 2021-02-25 DIAGNOSIS — Z87891 Personal history of nicotine dependence: Secondary | ICD-10-CM | POA: Insufficient documentation

## 2021-02-25 DIAGNOSIS — Z85118 Personal history of other malignant neoplasm of bronchus and lung: Secondary | ICD-10-CM | POA: Diagnosis not present

## 2021-02-25 DIAGNOSIS — R0602 Shortness of breath: Secondary | ICD-10-CM | POA: Diagnosis present

## 2021-02-25 DIAGNOSIS — J441 Chronic obstructive pulmonary disease with (acute) exacerbation: Secondary | ICD-10-CM | POA: Diagnosis not present

## 2021-02-25 IMAGING — DX DG CHEST 2V
2 series · 2 of 2 positions shown · non-contrast
Comparison: [DATE] radiograph, chest CT 525 [OP]

CLINICAL DATA: short of breath

EXAM:
CHEST - 2 VIEW

[chest pa]
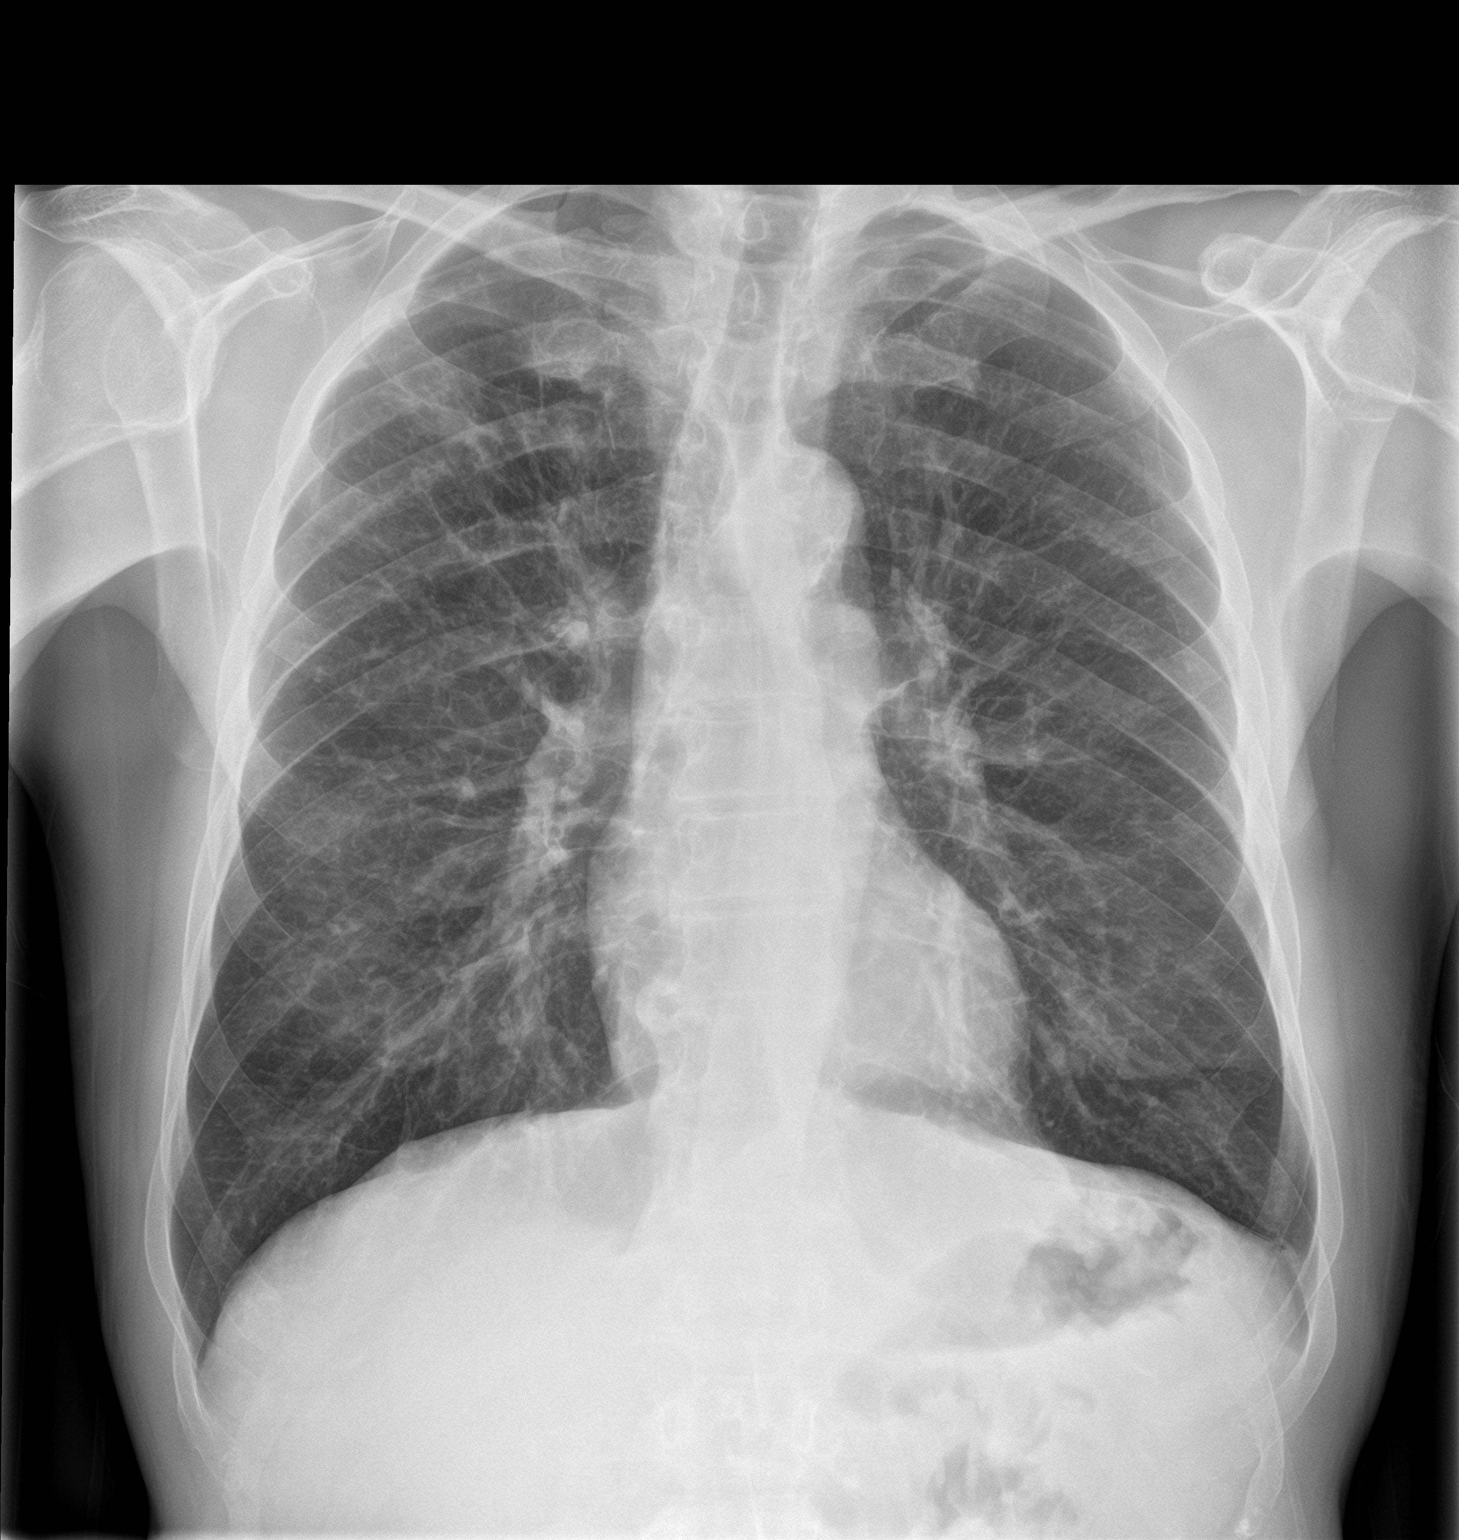

[chest lat]
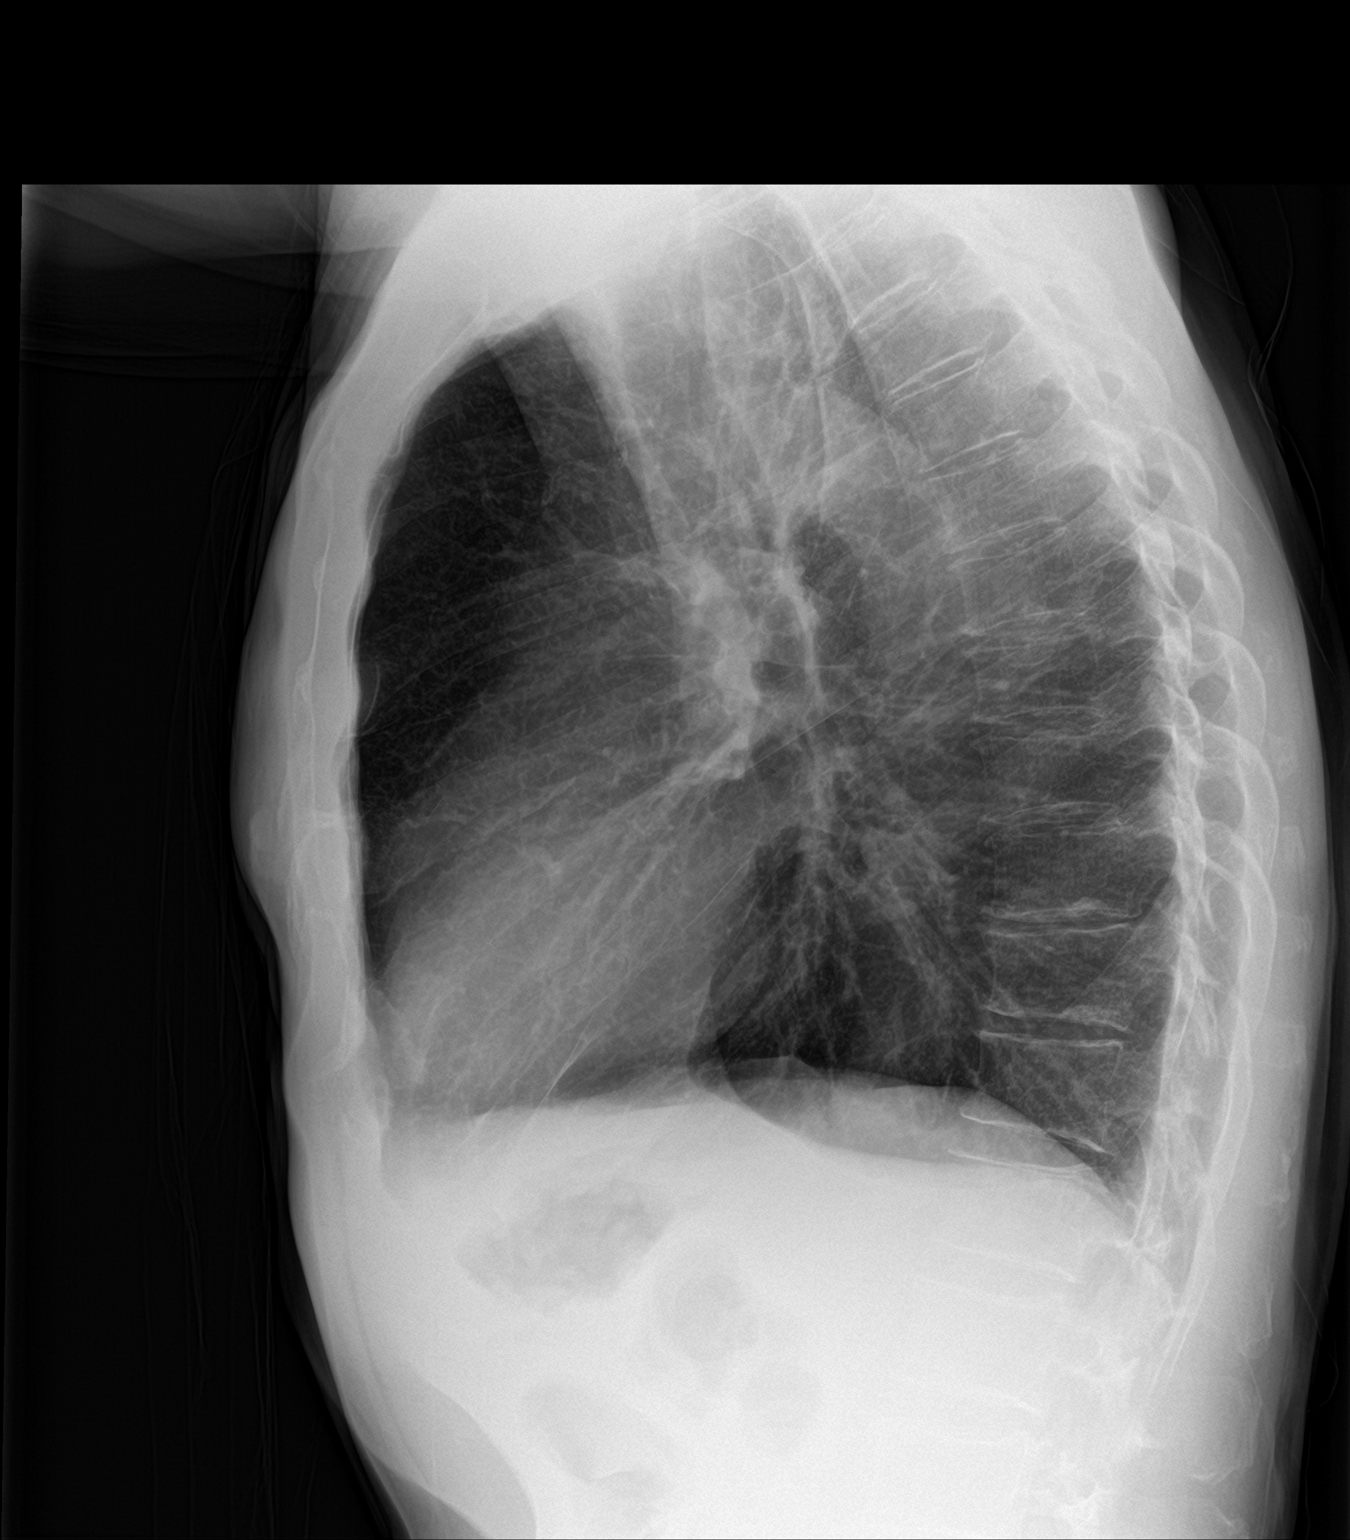

[2 of 2 positions shown; findings below may reference images not displayed]

FINDINGS: Unchanged cardiomediastinal silhouette. Unchanged right upper lobe
nodular opacity. There is no new focal airspace disease. There is no
large pleural effusion or visible pneumothorax. Thoracic
spondylosis. No acute osseous abnormality.
IMPRESSION: No new focal airspace disease. Unchanged nodular opacity in the
right upper lung.

## 2021-02-25 MED ORDER — GUAIFENESIN-CODEINE 100-10 MG/5ML PO SOLN: 10.0000 mL | Freq: Four times a day (QID) | ORAL | 0 refills | Status: DC | PRN

## 2021-02-25 MED ORDER — BREO ELLIPTA 200-25 MCG/INH IN AEPB: 1.0000 | INHALATION_SPRAY | RESPIRATORY_TRACT | 1 refills | Status: DC

## 2021-02-25 MED ORDER — ALBUTEROL SULFATE HFA 108 (90 BASE) MCG/ACT IN AERS
2.0000 | INHALATION_SPRAY | RESPIRATORY_TRACT | Status: DC | PRN
  Administered 2021-02-25: 2 via RESPIRATORY_TRACT
  Filled 2021-02-25: qty 6.7

## 2021-02-25 NOTE — ED Provider Notes (Signed)
[facility] Provider Note   CSN: [id number] Arrival date & time: 02/25/21  1259     History Chief Complaint  Patient presents with  . Shortness of Breath    Antonio Payne is a 65 y.o. male with stage IV lung cancer under the care of Dr. Delton Coombes returning today for evaluation of persistent intermittent shortness of breath, cough and wheezing.  He is concerned that he may have developed a pneumonia despite an x-ray from our urgent care center from May 18 which was negative for this finding.  He was placed on a course of doxycycline and a prednisone taper which he is almost finished with but states he continues to have a cough which is sometimes productive.  He has shortness of breath intermittently with exertion, no chest pain, no pleuritic sx or orthopnea.   He can use 2 puffs of his albuterol inhaler with significant improvement in the symptoms.  He denies fevers or chills.  He states he has had chronic clear rhinorrhea possibly postnasal drip as well over the past several weeks.  He denies sneezing, itchy or watery eyes.  He does note he got a refill of his Breo for his COPD just prior to onset of the symptoms and noticed that the milligrams strength was 100 mg whereas prior he was on a 200 mg strength inhaler. Also denies fevers or chills.  His COVID and flu screen on May 18 was negative.  He is not COVID vaccinated.  HPI     Past Medical History:  Diagnosis Date  . Cancer [facility])    lung cancer     Patient Active Problem List   Diagnosis Date Noted  . Adenocarcinoma of lung, stage 4, right ([place]) 12/08/2020    Past Surgical History:  Procedure Laterality Date  . BACK SURGERY         No family history on file.  Social History   Tobacco Use  . Smoking status: Former Smoker    Types: Cigarettes    Quit date: 12/01/2019    Years since quitting: 1.2  . Smokeless tobacco: Never Used  Substance Use Topics  . Alcohol use: Never  . Drug use: Never     Home Medications Prior to Admission medications   Medication Sig Start Date End Date Taking? Authorizing Provider  fluticasone furoate-vilanterol (BREO ELLIPTA) 200-25 MCG/INH AEPB Inhale 1 puff into the lungs every morning. 02/25/21  Yes Valentine Barney, Almyra Free, PA-C  guaiFENesin-codeine 100-10 MG/5ML syrup Take 10 mLs by mouth every 6 (six) hours as needed for cough. 02/25/21  Yes Fabio Wah, Almyra Free, PA-C  albuterol (VENTOLIN HFA) 108 (90 Base) MCG/ACT inhaler Inhale 2 puffs into the lungs every 4 (four) hours as needed for wheezing or shortness of breath. Patient not taking: Reported on 02/09/2021 12/29/20   [name], MD  doxycycline (VIBRAMYCIN) 100 MG capsule Take 1 capsule (100 mg total) by mouth 2 (two) times daily. 02/16/21   Faustino Congress, NP  predniSONE (STERAPRED UNI-PAK 21 TAB) 10 MG (21) TBPK tablet Take by mouth daily. Take 6 tabs by mouth daily  for 2 days, then 5 tabs for 2 days, then 4 tabs for 2 days, then 3 tabs for 2 days, 2 tabs for 2 days, then 1 tab by mouth daily for 2 days 02/16/21   Faustino Congress, NP    Allergies    Patient has no known allergies.  Review of Systems   Review of Systems  Constitutional: Negative for fever.  HENT: Positive  for rhinorrhea. Negative for congestion and sore throat.   Eyes: Negative.   Respiratory: Positive for cough, shortness of breath and wheezing. Negative for chest tightness.   Cardiovascular: Negative for chest pain, palpitations and leg swelling.  Gastrointestinal: Negative for abdominal pain and nausea.  Genitourinary: Negative.   Musculoskeletal: Negative for arthralgias, joint swelling and neck pain.  Skin: Negative.  Negative for rash and wound.  Neurological: Negative for dizziness, weakness, light-headedness, numbness and headaches.  Psychiatric/Behavioral: Negative.     Physical Exam Updated Vital Signs BP (!) 143/93   Pulse 79   Temp 98.4 F (36.9 C) (Oral)   Resp 17   SpO2 98%   Physical Exam Vitals and  nursing note reviewed.  Constitutional:      Appearance: He is well-developed.  HENT:     Head: Normocephalic and atraumatic.  Eyes:     Conjunctiva/sclera: Conjunctivae normal.  Cardiovascular:     Rate and Rhythm: Normal rate and regular rhythm.     Heart sounds: Normal heart sounds.  Pulmonary:     Effort: Pulmonary effort is normal.     Breath sounds: Wheezing present.     Comments: Mild bilateral expiratory wheeze with prolonged expirations Abdominal:     General: Bowel sounds are normal.     Palpations: Abdomen is soft.     Tenderness: There is no abdominal tenderness.  Musculoskeletal:        General: Normal range of motion.     Cervical back: Normal range of motion.     Right lower leg: No edema.     Left lower leg: No edema.  Skin:    General: Skin is warm and dry.  Neurological:     Mental Status: He is alert.     ED Results / Procedures / Treatments   Labs (all labs ordered are listed, but only abnormal results are displayed) Labs Reviewed - No data to display  EKG EKG Interpretation  Date/Time:  Friday Feb 25 2021 13:33:54 EDT Ventricular Rate:  88 PR Interval:  114 QRS Duration: 92 QT Interval:  344 QTC Calculation: 416 R Axis:   78 Text Interpretation: Normal sinus rhythm Normal ECG No old tracing to compare Confirmed by Daleen Bo [phone]) on 02/25/2021 2:46:01 PM   Radiology DG Chest 2 View  Result Date: 02/25/2021 CLINICAL DATA:  short of breath EXAM: CHEST - 2 VIEW COMPARISON:  02/16/2021 radiograph, chest CT 525 2022 FINDINGS: Unchanged cardiomediastinal silhouette. Unchanged right upper lobe nodular opacity. There is no new focal airspace disease. There is no large pleural effusion or visible pneumothorax. Thoracic spondylosis. No acute osseous abnormality. IMPRESSION: No new focal airspace disease. Unchanged nodular opacity in the right upper lung. Electronically Signed   By: Maurine Simmering   On: 02/25/2021 14:13    Procedures Procedures    Medications Ordered in ED Medications  albuterol (VENTOLIN HFA) 108 (90 Base) MCG/ACT inhaler 2 puff (2 puffs Inhalation Given 02/25/21 1404)    ED Course  I have reviewed the triage vital signs and the nursing notes.  Pertinent labs & imaging results that were available during my care of the patient were reviewed by me and considered in my medical decision making (see chart for details).    MDM Rules/Calculators/A&P                          Patient with shortness of breath and wheezing.  After significant discussion it was determined that his  symptoms started around the time his Memory Dance was inadvertently reduced to 100 mg from his 200 mg strength medication.  I strongly suspect that this is the reason for his persistent cough and wheezing.  His chest x-ray rate today is stable with no pneumonia.  He was ambulated in the department with no desaturation.  He does have an albuterol MDI for rescue inhaler.  He has no exam findings to suggest PE or CHF.  He was placed on the Breo 200 mg strength inhaler, he also has his rescue inhaler as needed.  He was prescribed codeine cough syrup to help with this symptom while his Memory Dance is hopefully becoming more effective for him.  He does see Dr. Raliegh Ip next in July, return precautions were outlined. Final Clinical Impression(s) / ED Diagnoses Final diagnoses:  COPD exacerbation ([place])    Rx / DC Orders ED Discharge Orders         Ordered    fluticasone furoate-vilanterol (BREO ELLIPTA) 200-25 MCG/INH AEPB  BH-each morning        02/25/21 1505    guaiFENesin-codeine 100-10 MG/5ML syrup  Every 6 hours PRN        02/25/21 1505           Evalee Jefferson, Hershal Coria 02/25/21 1532    Daleen Bo, MD 02/26/21 1141

## 2021-02-25 NOTE — Discharge Instructions (Addendum)
Start taking your new increased dose of Breo as discussed.  You have also been prescribed the cough syrup which has been helpful for you in the past, use caution with this medication as it can make you drowsy, do not drive within 4 hours of taking the cough syrup.  Continue using your albuterol if needed for rescue wheezing or shortness of breath every 3-4 hours taking 1 to 2 puffs.

## 2021-02-25 NOTE — ED Notes (Signed)
Pt ambulated without any SOB. Pt's O2 remained between 98%-100% heart rate stayed around 95.

## 2021-02-25 NOTE — ED Triage Notes (Signed)
States he has been on treatment for cough and is almost done, states he is not better, history of lung cancer.

## 2021-03-24 ENCOUNTER — Ambulatory Visit (HOSPITAL_COMMUNITY): Payer: Medicare Other | Admitting: Hematology

## 2021-03-24 ENCOUNTER — Encounter (HOSPITAL_COMMUNITY): Payer: Self-pay

## 2021-03-24 ENCOUNTER — Other Ambulatory Visit: Payer: Self-pay

## 2021-03-24 ENCOUNTER — Inpatient Hospital Stay (HOSPITAL_COMMUNITY): Payer: Medicare Other

## 2021-03-24 ENCOUNTER — Inpatient Hospital Stay (HOSPITAL_COMMUNITY): Payer: Medicare Other | Attending: Hematology

## 2021-03-24 VITALS — BP 135/66 | HR 72 | Temp 97.8°F | Resp 18

## 2021-03-24 DIAGNOSIS — Z5112 Encounter for antineoplastic immunotherapy: Secondary | ICD-10-CM | POA: Diagnosis not present

## 2021-03-24 DIAGNOSIS — C3411 Malignant neoplasm of upper lobe, right bronchus or lung: Secondary | ICD-10-CM | POA: Diagnosis present

## 2021-03-24 DIAGNOSIS — Z79899 Other long term (current) drug therapy: Secondary | ICD-10-CM | POA: Diagnosis not present

## 2021-03-24 DIAGNOSIS — C3491 Malignant neoplasm of unspecified part of right bronchus or lung: Secondary | ICD-10-CM

## 2021-03-24 LAB — CBC WITH DIFFERENTIAL/PLATELET
Abs Immature Granulocytes: 0.04 10*3/uL (ref 0.00–0.07)
Basophils Absolute: 0.1 10*3/uL (ref 0.0–0.1)
Basophils Relative: 1 %
Eosinophils Absolute: 0.3 10*3/uL (ref 0.0–0.5)
Eosinophils Relative: 5 %
HCT: 40.4 % (ref 39.0–52.0)
Hemoglobin: 13.4 g/dL (ref 13.0–17.0)
Immature Granulocytes: 1 %
Lymphocytes Relative: 22 %
Lymphs Abs: 1.6 10*3/uL (ref 0.7–4.0)
MCH: 32.3 pg (ref 26.0–34.0)
MCHC: 33.2 g/dL (ref 30.0–36.0)
MCV: 97.3 fL (ref 80.0–100.0)
Monocytes Absolute: 0.8 10*3/uL (ref 0.1–1.0)
Monocytes Relative: 11 %
Neutro Abs: 4.4 10*3/uL (ref 1.7–7.7)
Neutrophils Relative %: 60 %
Platelets: 263 10*3/uL (ref 150–400)
RBC: 4.15 MIL/uL — ABNORMAL LOW (ref 4.22–5.81)
RDW: 13.5 % (ref 11.5–15.5)
WBC: 7.2 10*3/uL (ref 4.0–10.5)
nRBC: 0 % (ref 0.0–0.2)

## 2021-03-24 LAB — COMPREHENSIVE METABOLIC PANEL
ALT: 19 U/L (ref 0–44)
AST: 20 U/L (ref 15–41)
Albumin: 4.1 g/dL (ref 3.5–5.0)
Alkaline Phosphatase: 78 U/L (ref 38–126)
Anion gap: 7 (ref 5–15)
BUN: 15 mg/dL (ref 8–23)
CO2: 27 mmol/L (ref 22–32)
Calcium: 9 mg/dL (ref 8.9–10.3)
Chloride: 95 mmol/L — ABNORMAL LOW (ref 98–111)
Creatinine, Ser: 0.86 mg/dL (ref 0.61–1.24)
GFR, Estimated: >60 mL/min (ref >60)
Glucose, Bld: 102 mg/dL — ABNORMAL HIGH (ref 70–99)
Potassium: 4.9 mmol/L (ref 3.5–5.1)
Sodium: 129 mmol/L — ABNORMAL LOW (ref 135–145)
Total Bilirubin: 0.8 mg/dL (ref 0.3–1.2)
Total Protein: 6.7 g/dL (ref 6.5–8.1)

## 2021-03-24 LAB — MAGNESIUM: Magnesium: 2.1 mg/dL (ref 1.7–2.4)

## 2021-03-24 LAB — TSH: TSH: 1.175 u[IU]/mL (ref 0.350–4.500)

## 2021-03-24 MED ORDER — SODIUM CHLORIDE 0.9 % IV SOLN
400.0000 mg | Freq: Once | INTRAVENOUS | Status: AC
  Administered 2021-03-24: 400 mg via INTRAVENOUS
  Filled 2021-03-24: qty 16

## 2021-03-24 MED ORDER — SODIUM CHLORIDE 0.9 % IV SOLN: Freq: Once | INTRAVENOUS | Status: AC

## 2021-03-24 NOTE — Progress Notes (Signed)
Patient presents today for Keytruda. Vital signs within parameters for today's treatment. MAR reviewed and updated. Labs within parameters for treatment.  Treatment given today per MD orders. Tolerated infusion without adverse affects. Vital signs stable. No complaints at this time. Discharged from clinic ambulatory in stable condition. Alert and oriented x 3. F/U with [facility] as scheduled.

## 2021-03-24 NOTE — Patient Instructions (Signed)
[facility]  Discharge Instructions: Thank you for choosing [facility] to provide your oncology and hematology care.  If you have a lab appointment with the [facility], please come in thru the Main Entrance and check in at the main information desk.  Wear comfortable clothing and clothing appropriate for easy access to any Portacath or PICC line.   We strive to give you quality time with your provider. You may need to reschedule your appointment if you arrive late (15 or more minutes).  Arriving late affects you and other patients whose appointments are after yours.  Also, if you miss three or more appointments without notifying the office, you may be dismissed from the clinic at the provider's discretion.      For prescription refill requests, have your pharmacy contact our office and allow 72 hours for refills to be completed.    Today you received the following chemotherapy and/or immunotherapy agents keytruda.       To help prevent nausea and vomiting after your treatment, we encourage you to take your nausea medication as directed.  BELOW ARE SYMPTOMS THAT SHOULD BE REPORTED IMMEDIATELY: *FEVER GREATER THAN 100.4 F (38 C) OR HIGHER *CHILLS OR SWEATING *NAUSEA AND VOMITING THAT IS NOT CONTROLLED WITH YOUR NAUSEA MEDICATION *UNUSUAL SHORTNESS OF BREATH *UNUSUAL BRUISING OR BLEEDING *URINARY PROBLEMS (pain or burning when urinating, or frequent urination) *BOWEL PROBLEMS (unusual diarrhea, constipation, pain near the anus) TENDERNESS IN MOUTH AND THROAT WITH OR WITHOUT PRESENCE OF ULCERS (sore throat, sores in mouth, or a toothache) UNUSUAL RASH, SWELLING OR PAIN  UNUSUAL VAGINAL DISCHARGE OR ITCHING   Items with * indicate a potential emergency and should be followed up as soon as possible or go to the Emergency Department if any problems should occur.  Please show the CHEMOTHERAPY ALERT CARD or IMMUNOTHERAPY ALERT CARD at check-in to the Emergency  Department and triage nurse.  Should you have questions after your visit or need to cancel or reschedule your appointment, please contact [facility] [phone]  and follow the prompts.  Office hours are 8:00 a.m. to 4:30 p.m. Monday - Friday. Please note that voicemails left after 4:00 p.m. may not be returned until the following business day.  We are closed weekends and major holidays. You have access to a nurse at all times for urgent questions. Please call the main number to the clinic [phone] and follow the prompts.  For any non-urgent questions, you may also contact your provider using MyChart. We now offer e-Visits for anyone 76 and older to request care online for non-urgent symptoms. For details visit [email or web address].   Also download the MyChart app! Go to the app store, search "MyChart", open the app, select [facility], and log in with your MyChart username and password.  Due to Covid, a mask is required upon entering the hospital/clinic. If you do not have a mask, one will be given to you upon arrival. For doctor visits, patients may have 1 support person aged 55 or older with them. For treatment visits, patients cannot have anyone with them due to current Covid guidelines and our immunocompromised population.

## 2021-04-20 ENCOUNTER — Other Ambulatory Visit (HOSPITAL_COMMUNITY): Payer: Medicare Other

## 2021-04-20 ENCOUNTER — Ambulatory Visit (HOSPITAL_COMMUNITY): Payer: Medicare Other | Admitting: Hematology

## 2021-04-20 ENCOUNTER — Ambulatory Visit (HOSPITAL_COMMUNITY): Payer: Medicare Other

## 2021-05-03 NOTE — Progress Notes (Signed)
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  PRIOR THERAPY: none  NGS Results: PD-L1 TPS >95%; EGFR, ALK, ROS1, RET, BRAF V600E negative  CURRENT THERAPY: Keytruda every 6 weeks  BRIEF ONCOLOGIC HISTORY:  Oncology History  Adenocarcinoma of lung, stage 4, right ([place])  12/08/2020 Initial Diagnosis   Adenocarcinoma of lung, stage 4, right ([place])    12/08/2020 Cancer Staging   Staging form: Lung, AJCC 8th Edition - Clinical stage from 12/08/2020: Stage IVB (cT2b, cN3, pM1c) - Signed by [name], MD on 12/08/2020  Histopathologic type: Adenocarcinoma, NOS    12/29/2020 -  Chemotherapy    Patient is on Treatment Plan: LUNG PEMBROLIZUMAB Q42D         CANCER STAGING: Cancer Staging Adenocarcinoma of lung, stage 4, right [facility]) Staging form: Lung, AJCC 8th Edition - Clinical stage from 12/08/2020: Stage IVB (cT2b, cN3, pM1c) - Signed by [name], MD on 12/08/2020   INTERVAL HISTORY:  [name], a 65 y.o. male, returns for routine follow-up and consideration for next cycle of immunotherapy. Antonio Payne was last seen on 02/09/21.  Due for cycle #4 of Keytruda today.   Overall, he tells me he has been feeling pretty well. He denies any SOB, diarrhea, rash, headaches, or vision changes, and reports good appetite.   Overall, he feels ready for next cycle of immunotherapy today.   REVIEW OF SYSTEMS:  Review of Systems  Constitutional:  Negative for appetite change and fatigue.  Eyes:  Negative for eye problems.  Respiratory:  Negative for shortness of breath.   Gastrointestinal:  Negative for diarrhea.  Skin:  Negative for rash.  Neurological:  Negative for headaches.  All other systems reviewed and are negative.  PAST MEDICAL/SURGICAL HISTORY:  Past Medical History:  Diagnosis Date    Cancer ([place])    lung cancer    Past Surgical History:  Procedure Laterality Date   BACK SURGERY      SOCIAL HISTORY:  Social History   Socioeconomic History   Marital status: Single    Spouse name: Not on file   Number of children: Not on file   Years of education: Not on file   Highest education level: Not on file  Occupational History   Not on file  Tobacco Use   Smoking status: Former    Types: Cigarettes    Quit date: 12/01/2019    Years since quitting: 1.4   Smokeless tobacco: Never  Substance and Sexual Activity   Alcohol use: Never   Drug use: Never   Sexual activity: Not Currently  Other Topics Concern   Not on file  Social History Narrative   Not on file   Social Determinants of Health   Financial Resource Strain: Low Risk    Difficulty of Paying Living Expenses: Not hard at all  Food Insecurity: No Food Insecurity   Worried About Charity fundraiser in the Last Year: Never true   [place] in the Last Year: Never true  Transportation Needs: No Transportation Needs   Lack of Transportation (Medical): No   Lack of Transportation (Non-Medical): No  Physical Activity: Sufficiently Active   Days of Exercise per Week: 7 days   Minutes of Exercise per Session: 30 min  Stress: No Stress Concern Present   Feeling of Stress :  Only a little  Social Connections: Moderately Integrated   Frequency of Communication with Friends and Family: More than three times a week   Frequency of Social Gatherings with Friends and Family: More than three times a week   Attends Religious Services: 1 to 4 times per year   Active Member of [employer] or Organizations: No   Attends Music therapist: 1 to 4 times per year   Marital Status: Divorced  Human resources officer Violence: Not At Risk   Fear of Current or Ex-Partner: No   Emotionally Abused: No   Physically Abused: No   Sexually Abused: No    FAMILY HISTORY:  No family history on file.  CURRENT MEDICATIONS:   Current Outpatient Medications  Medication Sig Dispense Refill   albuterol (VENTOLIN HFA) 108 (90 Base) MCG/ACT inhaler Inhale 2 puffs into the lungs every 4 (four) hours as needed for wheezing or shortness of breath. 8 g 6   fluticasone furoate-vilanterol (BREO ELLIPTA) 200-25 MCG/INH AEPB Inhale 1 puff into the lungs every morning. 1 each 6   No current facility-administered medications for this visit.    ALLERGIES:  No Known Allergies  PHYSICAL EXAM:  Performance status (ECOG): 1 - Symptomatic but completely ambulatory  Vitals:   05/04/21 1253  BP: (!) 141/86  Pulse: 73  Resp: 18  Temp: (!) 96.8 F (36 C)  SpO2: 100%   Wt Readings from Last 3 Encounters:  05/04/21 149 lb (67.6 kg)  02/09/21 153 lb 1.6 oz (69.4 kg)  12/29/20 152 lb (68.9 kg)   Physical Exam Vitals reviewed.  Constitutional:      Appearance: Normal appearance.  Cardiovascular:     Rate and Rhythm: Normal rate and regular rhythm.     Pulses: Normal pulses.     Heart sounds: Normal heart sounds.  Pulmonary:     Effort: Pulmonary effort is normal.     Breath sounds: Normal breath sounds.  Abdominal:     Palpations: Abdomen is soft. There is no hepatomegaly, splenomegaly or mass.     Tenderness: There is no abdominal tenderness.  Musculoskeletal:     Right lower leg: No edema.     Left lower leg: No edema.  Neurological:     General: No focal deficit present.     Mental Status: He is alert and oriented to person, place, and time.  Psychiatric:        Mood and Affect: Mood normal.        Behavior: Behavior normal.    LABORATORY DATA:  I have reviewed the labs as listed.  CBC Latest Ref Rng & Units 05/04/2021 03/24/2021 02/09/2021  WBC 4.0 - 10.5 K/uL 7.3 7.2 7.4  Hemoglobin 13.0 - 17.0 g/dL 13.5 13.4 13.3  Hematocrit 39.0 - 52.0 % 41.1 40.4 40.3  Platelets 150 - 400 K/uL 242 263 251   CMP Latest Ref Rng & Units 05/04/2021 03/24/2021 02/09/2021  Glucose 70 - 99 mg/dL 93 102(H) 97  BUN 8 - 23 mg/dL  _0 Creatinine 0.61 - 1.24 mg/dL 0.78 0.86 0.70  Sodium 135 - 145 mmol/L 130(L) 129(L) 131(L)  Potassium 3.5 - 5.1 mmol/L 4.8 4.9 4.1  Chloride 98 - 111 mmol/L 97(L) 95(L) 97(L)  CO2 22 - 32 mmol/L _1 Calcium 8.9 - 10.3 mg/dL 8.9 9.0 9.0  Total Protein 6.5 - 8.1 g/dL 6.9 6.7 6.8  Total Bilirubin 0.3 - 1.2 mg/dL 0.5 0.8 0.5  Alkaline Phos 38 - 126 U/L  78 78 84  AST 15 - 41 U/L _0 ALT 0 - 44 U/L _1 DIAGNOSTIC IMAGING:  I have independently reviewed the scans and discussed with the patient. No results found.   ASSESSMENT:  1.  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 -Mutations were negative for EGFR, ALK, ROS1, RET, BRAF V600 -PD-L1 22 C3 greater than 95%. -He was started on single agent pembrolizumab every 3 weeks under the direction of Dr. Dola Factor in [place] in August 2020, later switched to every 6 weeks. -We will consider testing for K-ras G 12 C, NTR K and met exon 14 mutations upon progression.   2.  Social/family history: -He is a retired Administrator.  Quit smoking in August 2020. -Father had prostate cancer.   PLAN:  1.  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  Chest x-ray reviewed by me did not show any evidence of pneumonitis. - However his breathing has improved after Breo Ellipta dose was given at 200 mcg. - CT CAP on 12/24/2020 did not show any evidence of metastatic disease in the chest, abdomen or pelvis.  Right upper lobe mass stable in size compared to prior study with some postradiation changes. -He does not report any immunotherapy related side effects. - Reviewed his labs today which showed normal LFTs.  Sodium has been stable around 130.  CBC was normal.  TSH was 2.15. - Proceed with Keytruda today.  We  will schedule for CT of the chest with contrast in 6 weeks prior to his next treatment.   2.  Brain metastasis: -He did not receive any prior radiation therapy. - MRI of the brain on 12/25/2018 showed no evidence of metastatic disease.  Chronically seen sclerotic focus within the C3 vertebral body grossly same from 2018. - He does not have any vision changes or headaches.  We will plan to repeat MRI in 1 year.   3.  COPD: -Continue Breo Ellipta once daily and Ventolin as needed.  We have sent a refill.   Orders placed this encounter:  No orders of the defined types were placed in this encounter.    [name], MD [place] [phone]   I, Thana Ates, am acting as a scribe for Dr. [name].  I, [name] MD, have reviewed the above documentation for accuracy and completeness, and I agree with the above.

## 2021-05-04 ENCOUNTER — Inpatient Hospital Stay (HOSPITAL_COMMUNITY): Payer: Medicare Other

## 2021-05-04 ENCOUNTER — Other Ambulatory Visit: Payer: Self-pay

## 2021-05-04 ENCOUNTER — Other Ambulatory Visit (HOSPITAL_COMMUNITY): Payer: Self-pay | Admitting: *Deleted

## 2021-05-04 ENCOUNTER — Inpatient Hospital Stay (HOSPITAL_BASED_OUTPATIENT_CLINIC_OR_DEPARTMENT_OTHER): Payer: Medicare Other | Admitting: Hematology

## 2021-05-04 ENCOUNTER — Inpatient Hospital Stay (HOSPITAL_COMMUNITY): Payer: Medicare Other | Attending: Hematology

## 2021-05-04 VITALS — BP 141/86 | HR 73 | Temp 96.8°F | Resp 18 | Wt 149.0 lb

## 2021-05-04 VITALS — BP 120/68 | HR 74 | Temp 96.9°F | Resp 18

## 2021-05-04 DIAGNOSIS — Z79899 Other long term (current) drug therapy: Secondary | ICD-10-CM | POA: Insufficient documentation

## 2021-05-04 DIAGNOSIS — J449 Chronic obstructive pulmonary disease, unspecified: Secondary | ICD-10-CM | POA: Insufficient documentation

## 2021-05-04 DIAGNOSIS — Z5112 Encounter for antineoplastic immunotherapy: Secondary | ICD-10-CM | POA: Insufficient documentation

## 2021-05-04 DIAGNOSIS — C3491 Malignant neoplasm of unspecified part of right bronchus or lung: Secondary | ICD-10-CM

## 2021-05-04 DIAGNOSIS — C3411 Malignant neoplasm of upper lobe, right bronchus or lung: Secondary | ICD-10-CM

## 2021-05-04 DIAGNOSIS — C7931 Secondary malignant neoplasm of brain: Secondary | ICD-10-CM | POA: Insufficient documentation

## 2021-05-04 LAB — CBC WITH DIFFERENTIAL/PLATELET
Abs Immature Granulocytes: 0.01 10*3/uL (ref 0.00–0.07)
Basophils Absolute: 0.1 10*3/uL (ref 0.0–0.1)
Basophils Relative: 1 %
Eosinophils Absolute: 0.7 10*3/uL — ABNORMAL HIGH (ref 0.0–0.5)
Eosinophils Relative: 9 %
HCT: 41.1 % (ref 39.0–52.0)
Hemoglobin: 13.5 g/dL (ref 13.0–17.0)
Immature Granulocytes: 0 %
Lymphocytes Relative: 22 %
Lymphs Abs: 1.6 10*3/uL (ref 0.7–4.0)
MCH: 31.8 pg (ref 26.0–34.0)
MCHC: 32.8 g/dL (ref 30.0–36.0)
MCV: 96.7 fL (ref 80.0–100.0)
Monocytes Absolute: 0.6 10*3/uL (ref 0.1–1.0)
Monocytes Relative: 8 %
Neutro Abs: 4.4 10*3/uL (ref 1.7–7.7)
Neutrophils Relative %: 60 %
Platelets: 242 10*3/uL (ref 150–400)
RBC: 4.25 MIL/uL (ref 4.22–5.81)
RDW: 12.6 % (ref 11.5–15.5)
WBC: 7.3 10*3/uL (ref 4.0–10.5)
nRBC: 0 % (ref 0.0–0.2)

## 2021-05-04 LAB — COMPREHENSIVE METABOLIC PANEL
ALT: 18 U/L (ref 0–44)
AST: 20 U/L (ref 15–41)
Albumin: 4.1 g/dL (ref 3.5–5.0)
Alkaline Phosphatase: 78 U/L (ref 38–126)
Anion gap: 7 (ref 5–15)
BUN: 15 mg/dL (ref 8–23)
CO2: 26 mmol/L (ref 22–32)
Calcium: 8.9 mg/dL (ref 8.9–10.3)
Chloride: 97 mmol/L — ABNORMAL LOW (ref 98–111)
Creatinine, Ser: 0.78 mg/dL (ref 0.61–1.24)
GFR, Estimated: >60 mL/min (ref >60)
Glucose, Bld: 93 mg/dL (ref 70–99)
Potassium: 4.8 mmol/L (ref 3.5–5.1)
Sodium: 130 mmol/L — ABNORMAL LOW (ref 135–145)
Total Bilirubin: 0.5 mg/dL (ref 0.3–1.2)
Total Protein: 6.9 g/dL (ref 6.5–8.1)

## 2021-05-04 LAB — TSH: TSH: 2.155 u[IU]/mL (ref 0.350–4.500)

## 2021-05-04 LAB — MAGNESIUM: Magnesium: 2.2 mg/dL (ref 1.7–2.4)

## 2021-05-04 MED ORDER — SODIUM CHLORIDE 0.9% FLUSH
10.0000 mL | INTRAVENOUS | Status: DC | PRN
  Administered 2021-05-04: 10 mL

## 2021-05-04 MED ORDER — FLUTICASONE FUROATE-VILANTEROL 200-25 MCG/INH IN AEPB: 1.0000 | INHALATION_SPRAY | RESPIRATORY_TRACT | 6 refills | Status: DC

## 2021-05-04 MED ORDER — SODIUM CHLORIDE 0.9 % IV SOLN
400.0000 mg | Freq: Once | INTRAVENOUS | Status: AC
  Administered 2021-05-04: 400 mg via INTRAVENOUS
  Filled 2021-05-04: qty 16

## 2021-05-04 MED ORDER — SODIUM CHLORIDE 0.9 % IV SOLN: Freq: Once | INTRAVENOUS | Status: AC

## 2021-05-04 NOTE — Progress Notes (Signed)
Patient has been assessed, vital signs and labs have been reviewed by Dr. Katragadda. ANC, Creatinine, LFTs, and Platelets are within treatment parameters per Dr. Katragadda. The patient is good to proceed with treatment at this time. Primary RN and pharmacy aware.  

## 2021-05-04 NOTE — Patient Instructions (Signed)
Antonio Payne  Discharge Instructions: Thank you for choosing [facility] to provide your oncology and hematology care.  If you have a lab appointment with the [facility], please come in thru the Main Entrance and check in at the main information desk.  Wear comfortable clothing and clothing appropriate for easy access to any Portacath or PICC line.   We strive to give you quality time with your provider. You may need to reschedule your appointment if you arrive late (15 or more minutes).  Arriving late affects you and other patients whose appointments are after yours.  Also, if you miss three or more appointments without notifying the office, you may be dismissed from the clinic at the provider's discretion.      For prescription refill requests, have your pharmacy contact our office and allow 72 hours for refills to be completed.    Today you received the following chemotherapy and/or immunotherapy agents: Keytruda.    To help prevent nausea and vomiting after your treatment, we encourage you to take your nausea medication as directed.  BELOW ARE SYMPTOMS THAT SHOULD BE REPORTED IMMEDIATELY: *FEVER GREATER THAN 100.4 F (38 C) OR HIGHER *CHILLS OR SWEATING *NAUSEA AND VOMITING THAT IS NOT CONTROLLED WITH YOUR NAUSEA MEDICATION *UNUSUAL SHORTNESS OF BREATH *UNUSUAL BRUISING OR BLEEDING *URINARY PROBLEMS (pain or burning when urinating, or frequent urination) *BOWEL PROBLEMS (unusual diarrhea, constipation, pain near the anus) TENDERNESS IN MOUTH AND THROAT WITH OR WITHOUT PRESENCE OF ULCERS (sore throat, sores in mouth, or a toothache) UNUSUAL RASH, SWELLING OR PAIN  UNUSUAL VAGINAL DISCHARGE OR ITCHING   Items with * indicate a potential emergency and should be followed up as soon as possible or go to the Emergency Department if any problems should occur.  Please show the CHEMOTHERAPY ALERT CARD or IMMUNOTHERAPY ALERT CARD at check-in to the Emergency  Department and triage nurse.  Should you have questions after your visit or need to cancel or reschedule your appointment, please contact [facility] [phone]  and follow the prompts.  Office hours are 8:00 a.m. to 4:30 p.m. Monday - Friday. Please note that voicemails left after 4:00 p.m. may not be returned until the following business day.  We are closed weekends and major holidays. You have access to a nurse at all times for urgent questions. Please call the main number to the clinic [phone] and follow the prompts.  For any non-urgent questions, you may also contact your provider using MyChart. We now offer e-Visits for anyone 19 and older to request care online for non-urgent symptoms. For details visit [email or web address].   Also download the MyChart app! Go to the app store, search "MyChart", open the app, select [facility], and log in with your MyChart username and password.  Due to Covid, a mask is required upon entering the hospital/clinic. If you do not have a mask, one will be given to you upon arrival. For doctor visits, patients may have 1 support person aged 27 or older with them. For treatment visits, patients cannot have anyone with them due to current Covid guidelines and our immunocompromised population.

## 2021-05-04 NOTE — Patient Instructions (Addendum)
[place] at [facility] Discharge Instructions  You were seen today by Dr. Delton Coombes. He went over your recent results, and you received your treatment. You will be scheduled for a CT scan of your chest prior to your next appointment. Dr. Delton Coombes will see you back in 6 weeks for labs and follow up.   Thank you for choosing [facility] at [facility] to provide your oncology and hematology care.  To afford each patient quality time with our provider, please arrive at least 15 minutes before your scheduled appointment time.   If you have a lab appointment with the [facility] please come in thru the Main Entrance and check in at the main information desk  You need to re-schedule your appointment should you arrive 10 or more minutes late.  We strive to give you quality time with our providers, and arriving late affects you and other patients whose appointments are after yours.  Also, if you no show three or more times for appointments you may be dismissed from the clinic at the providers discretion.     Again, thank you for choosing [facility].  Our hope is that these requests will decrease the amount of time that you wait before being seen by our physicians.       _____________________________________________________________  Should you have questions after your visit to [facility], please contact our office at [phone] between the hours of 8:00 a.m. and 4:30 p.m.  Voicemails left after 4:00 p.m. will not be returned until the following business day.  For prescription refill requests, have your pharmacy contact our office and allow 72 hours.    Cancer Center Support Programs:   > Cancer Support Group  2nd Tuesday of the month 1pm-2pm, Journey Room

## 2021-05-04 NOTE — Progress Notes (Signed)
Patient tolerated chemotherapy with no complaints voiced. Side effects with management reviewed understanding verbalized. IV site clean and dry with no bruising or swelling noted at site. Good blood return noted before and after administration of chemotherapy. Band aid applied. Patient left in satisfactory condition with VSS and no s/s of distress noted.

## 2021-05-05 MED ORDER — LANREOTIDE ACETATE 120 MG/0.5ML ~~LOC~~ SOLN
SUBCUTANEOUS | Status: AC
  Filled 2021-05-05: qty 240

## 2021-06-10 ENCOUNTER — Encounter (HOSPITAL_COMMUNITY): Payer: Self-pay | Admitting: Radiology

## 2021-06-10 ENCOUNTER — Other Ambulatory Visit: Payer: Self-pay

## 2021-06-10 ENCOUNTER — Ambulatory Visit (HOSPITAL_COMMUNITY)
Admission: RE | Admit: 2021-06-10 | Discharge: 2021-06-10 | Disposition: A | Discharge status: Home / Self Care | Payer: Medicare Other | Source: Ambulatory Visit | Attending: Hematology | Admitting: Hematology

## 2021-06-10 DIAGNOSIS — C3491 Malignant neoplasm of unspecified part of right bronchus or lung: Secondary | ICD-10-CM | POA: Insufficient documentation

## 2021-06-10 LAB — POCT I-STAT CREATININE: Creatinine, Ser: 0.9 mg/dL (ref 0.61–1.24)

## 2021-06-10 IMAGING — CT CT CHEST W/ CM
2 of 3 series · 15 of 36 positions shown, 18 images · IV contrast (Omnipaque or Isovue)
Comparison: [DATE]

CLINICAL DATA: Metastatic lung cancer restaging, ongoing
chemotherapy

EXAM:
CT CHEST WITH CONTRAST
TECHNIQUE: Multidetector CT imaging of the chest was performed during
intravenous contrast administration.
CONTRAST:  60mL OMNIPAQUE IOHEXOL 350 MG/ML SOLN

[Series 2: routine chest with · axial · 0.69mm/px · z∈[+1159,+1475]mm · 12 of 186 slices shown, 15 images]
[im 14/186  mediastinal]
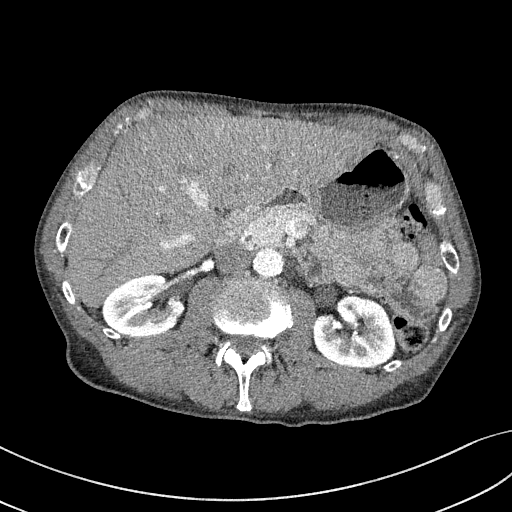
[im 14/186  lung]
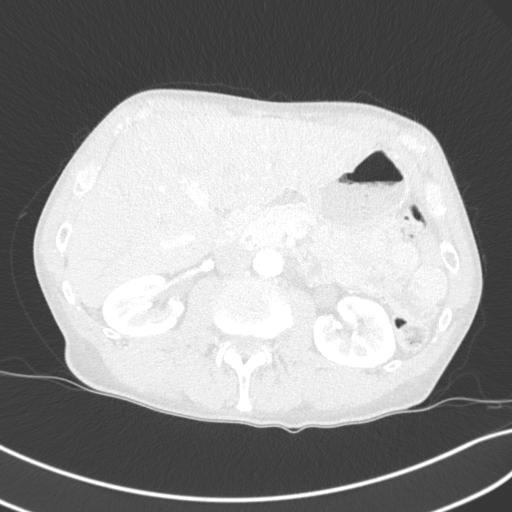
[im 28/186  lung]
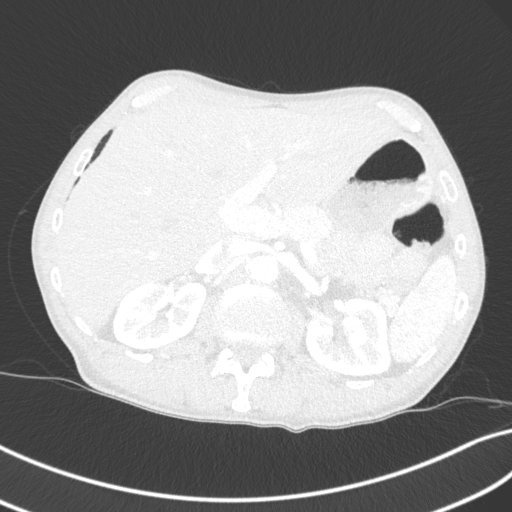
[im 42/186  lung]
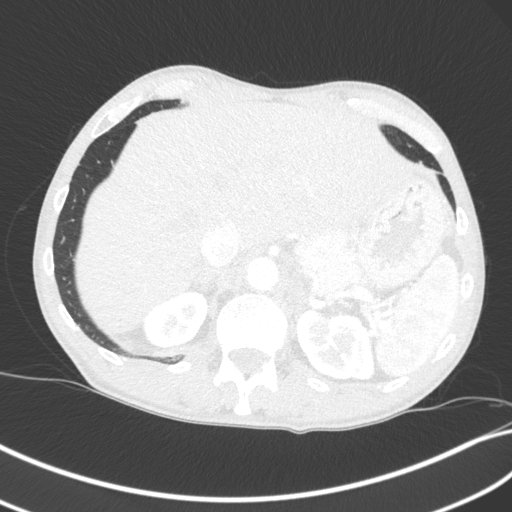
[im 55/186  lung]
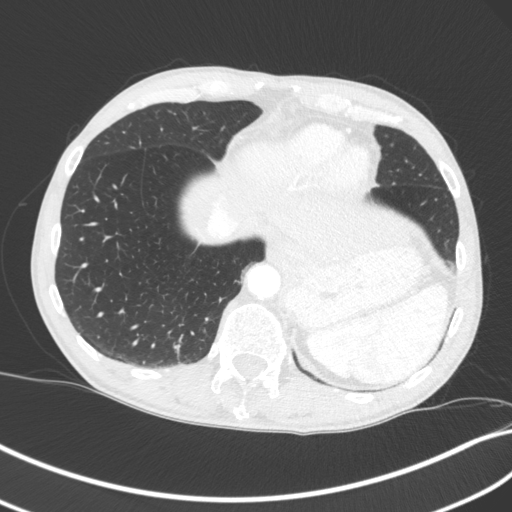
[im 69/186  mediastinal]
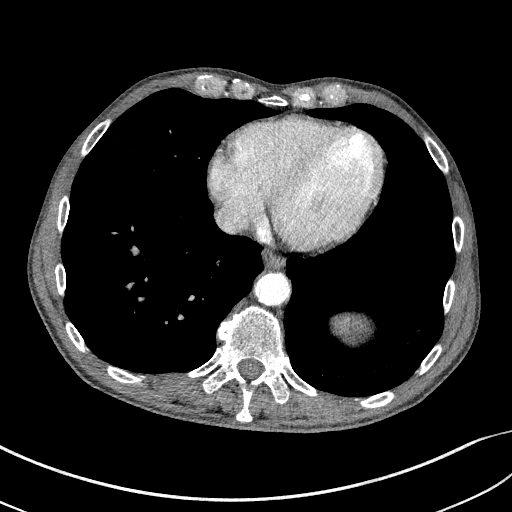
[im 69/186  lung]
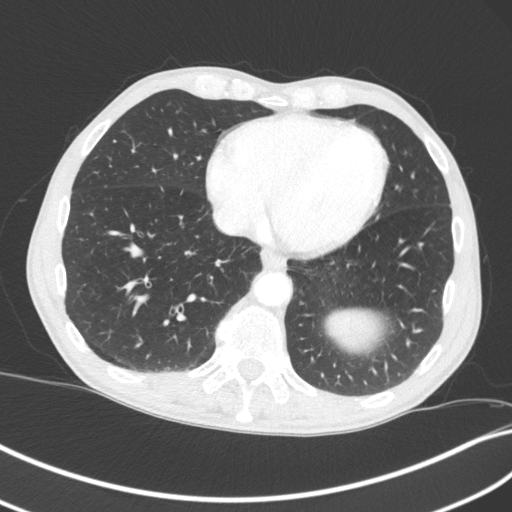
[im 83/186  lung]
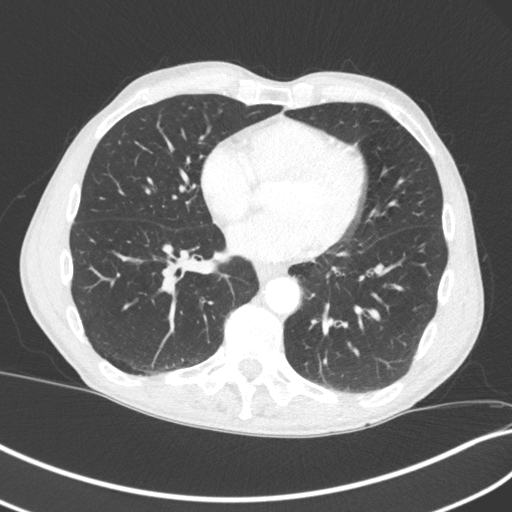
[im 103/186  lung]
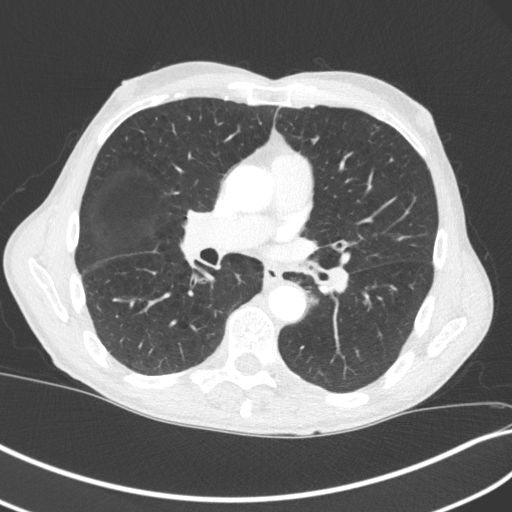
[im 117/186  lung]
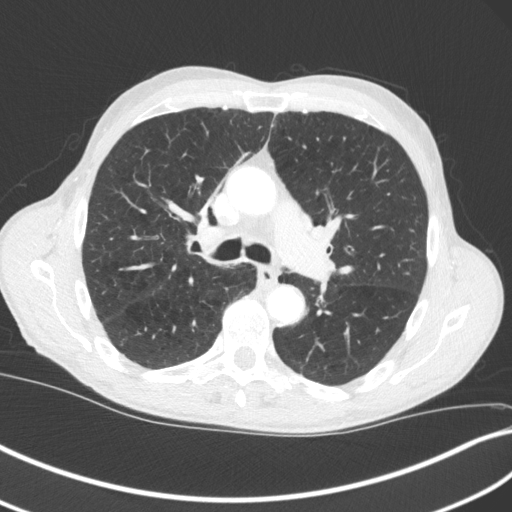
[im 131/186  mediastinal]
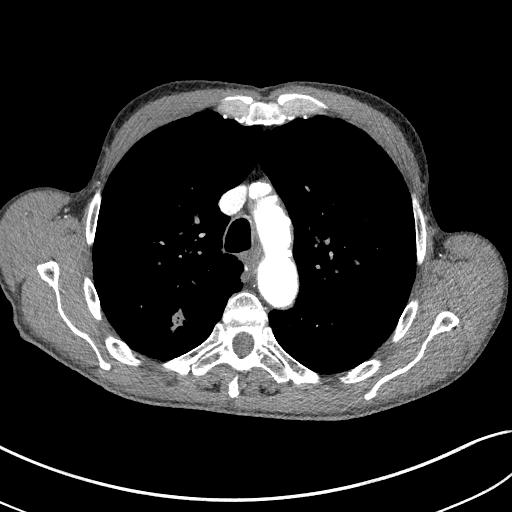
[im 131/186  lung]
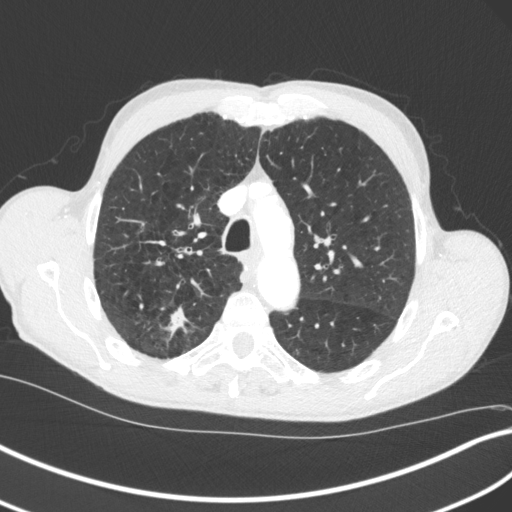
[im 144/186  lung]
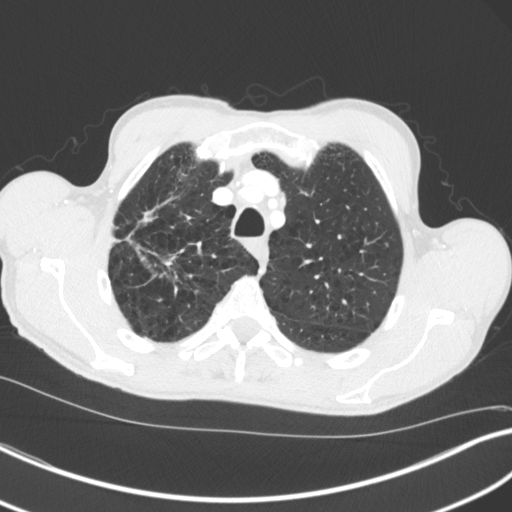
[im 158/186  lung]
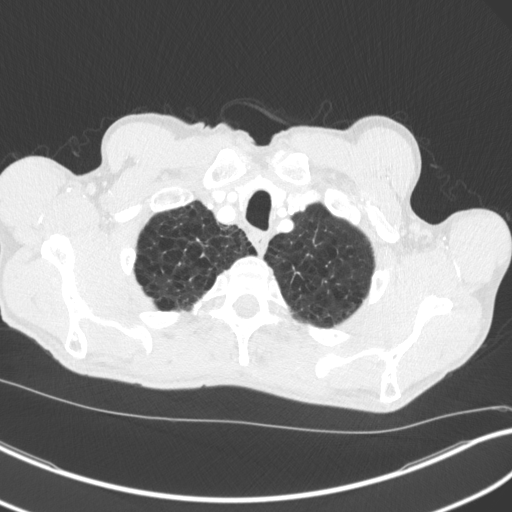
[im 172/186  lung]
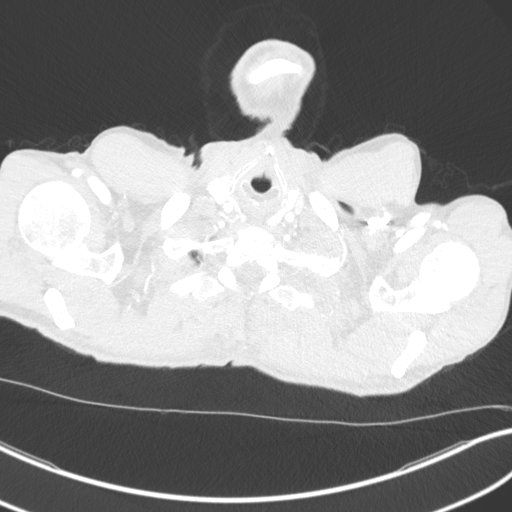

[Series 5: coronal · coronal · 0.74mm/px · 3 of 138 slices shown]
[im 28/138  lung]
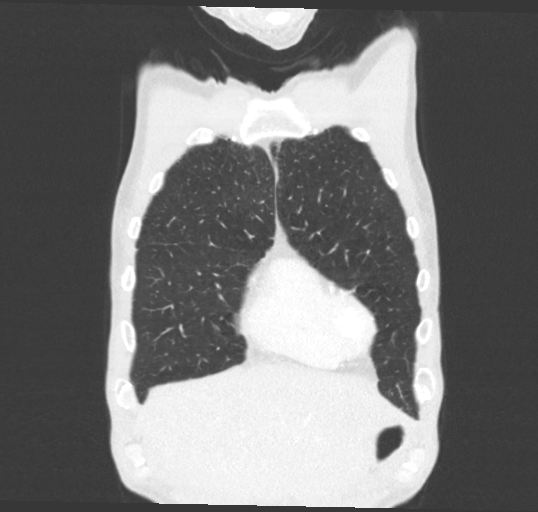
[im 55/138  lung]
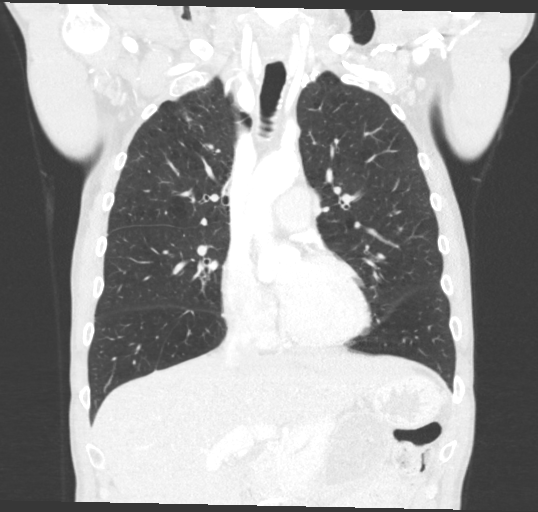
[im 83/138  lung]
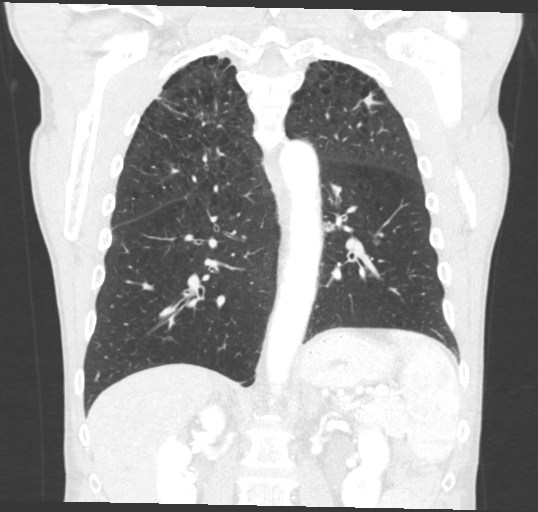

[15 of 36 positions shown; findings below may reference images not displayed]

FINDINGS: Cardiovascular: Aortic atherosclerosis. Normal heart size.
Three-vessel coronary artery calcifications. No pericardial
effusion.

Mediastinum/Nodes: No enlarged mediastinal, hilar, or axillary lymph
nodes. Thyroid gland, trachea, and esophagus demonstrate no
significant findings.

Lungs/Pleura: Moderate centrilobular emphysema. Diffuse bilateral
bronchial wall thickening. Unchanged post treatment appearance of a
mass of the central right upper lobe measuring 2.9 x 2.3 cm (series
4, image 47). There is a new, spiculated nodule of the posterior
right upper lobe, measuring 2.0 x 1.1 cm (series 4, image 58). New,
spiculated nodule of the posterior left pulmonary apex measuring
x 0.9 cm (series 4, image 35). New small rounded nodule of the
superior segment left lower lobe measuring 0.5 cm (series 4, image
66). Unchanged subpleural nodule of the dependent superior segment
left lower lobe measuring 0.9 cm (series 4, image 71). Unchanged
subpleural nodule of the medial segment right middle lobe measuring
0.4 cm (series 4, image 114). No pleural effusion or pneumothorax.

Upper Abdomen: No acute abnormality. Unchanged thickening of the
left adrenal gland without discrete nodule (series 2, image 147).

Musculoskeletal: No chest wall mass or suspicious bone lesions
identified.
IMPRESSION: 1. Unchanged post treatment appearance of a mass of the central
right upper lobe.
2. Multiple new spiculated and rounded nodules bilaterally, highly
concerning for metastatic disease. Infection or inflammation are not
however strictly excluded.
3. Additional small pulmonary nodules are stable and nonspecific,
likely benign sequelae of prior infection or inflammation. Attention
on follow-up.
4. Unchanged thickening of the left adrenal gland, likely benign and
incidental adenomatous hypertrophy. Attention on follow-up.
5. Emphysema and diffuse bilateral bronchial wall thickening.
6. Coronary artery disease.

Aortic Atherosclerosis ([7O]-[7O]) and Emphysema ([7O]-[7O]).

## 2021-06-10 MED ORDER — IOHEXOL 350 MG/ML SOLN
60.0000 mL | Freq: Once | INTRAVENOUS | Status: AC | PRN
  Administered 2021-06-10: 60 mL via INTRAVENOUS

## 2021-06-14 NOTE — Progress Notes (Signed)
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  PRIOR THERAPY: none  NGS Results: PD-L1 TPS >95%; EGFR, ALK, ROS1, RET, BRAF V600E negative  CURRENT THERAPY: Keytruda every 6 weeks  BRIEF ONCOLOGIC HISTORY:  Oncology History  Adenocarcinoma of lung, stage 4, right ([place])  12/08/2020 Initial Diagnosis   Adenocarcinoma of lung, stage 4, right ([place])   12/08/2020 Cancer Staging   Staging form: Lung, AJCC 8th Edition - Clinical stage from 12/08/2020: Stage IVB (cT2b, cN3, pM1c) - Signed by [name], MD on 12/08/2020 Histopathologic type: Adenocarcinoma, NOS   12/29/2020 -  Chemotherapy    Patient is on Treatment Plan: LUNG PEMBROLIZUMAB Q42D         CANCER STAGING: Cancer Staging Adenocarcinoma of lung, stage 4, right [facility]) Staging form: Lung, AJCC 8th Edition - Clinical stage from 12/08/2020: Stage IVB (cT2b, cN3, pM1c) - Signed by [name], MD on 12/08/2020   INTERVAL HISTORY:  Antonio Payne, a 65 y.o. male, returns for routine follow-up and consideration for next cycle of chemotherapy. Antonio Payne was last seen on 05/04/2021.  Due for cycle #5 of Keytruda today.   Overall, he tells me he has been feeling pretty well. He denies any recent infections, diarrhea, cough, SOB, loss of appetite, skin rash.  Overall, he feels ready for next cycle of chemo today.   REVIEW OF SYSTEMS:  Review of Systems  Constitutional:  Negative for appetite change (70%) and fatigue (60%).  Respiratory:  Negative for cough and shortness of breath.   Gastrointestinal:  Negative for diarrhea.  Skin:  Negative for rash.  All other systems reviewed and are negative.  PAST MEDICAL/SURGICAL HISTORY:  Past Medical History:  Diagnosis Date   Cancer ([place])    lung cancer    Past Surgical History:  Procedure  Laterality Date   BACK SURGERY      SOCIAL HISTORY:  Social History   Socioeconomic History   Marital status: Single    Spouse name: Not on file   Number of children: Not on file   Years of education: Not on file   Highest education level: Not on file  Occupational History   Not on file  Tobacco Use   Smoking status: Former    Types: Cigarettes    Quit date: 12/01/2019    Years since quitting: 1.5   Smokeless tobacco: Never  Substance and Sexual Activity   Alcohol use: Never   Drug use: Never   Sexual activity: Not Currently  Other Topics Concern   Not on file  Social History Narrative   Not on file   Social Determinants of Health   Financial Resource Strain: Low Risk    Difficulty of Paying Living Expenses: Not hard at all  Food Insecurity: No Food Insecurity   Worried About Charity fundraiser in the Last Year: Never true   [place] in the Last Year: Never true  Transportation Needs: No Transportation Needs   Lack of Transportation (Medical): No   Lack of Transportation (Non-Medical): No  Physical Activity: Sufficiently Active   Days of Exercise per Week: 7 days   Minutes of Exercise per Session: 30 min  Stress: No Stress Concern Present   Feeling of Stress : Only a little  Social Connections: Moderately Integrated   Frequency of Communication  with Friends and Family: More than three times a week   Frequency of Social Gatherings with Friends and Family: More than three times a week   Attends Religious Services: 1 to 4 times per year   Active Member of [employer] or Organizations: No   Attends Music therapist: 1 to 4 times per year   Marital Status: Divorced  Human resources officer Violence: Not At Risk   Fear of Current or Ex-Partner: No   Emotionally Abused: No   Physically Abused: No   Sexually Abused: No    FAMILY HISTORY:  No family history on file.  CURRENT MEDICATIONS:  Current Outpatient Medications  Medication Sig Dispense Refill    albuterol (VENTOLIN HFA) 108 (90 Base) MCG/ACT inhaler Inhale 2 puffs into the lungs every 4 (four) hours as needed for wheezing or shortness of breath. 8 g 6   fluticasone furoate-vilanterol (BREO ELLIPTA) 200-25 MCG/INH AEPB Inhale 1 puff into the lungs every morning. 1 each 6   No current facility-administered medications for this visit.    ALLERGIES:  No Known Allergies  PHYSICAL EXAM:  Performance status (ECOG): 1 - Symptomatic but completely ambulatory  There were no vitals filed for this visit. Wt Readings from Last 3 Encounters:  05/04/21 149 lb (67.6 kg)  02/09/21 153 lb 1.6 oz (69.4 kg)  12/29/20 152 lb (68.9 kg)   Physical Exam Vitals reviewed.  Constitutional:      Appearance: Normal appearance.  Cardiovascular:     Rate and Rhythm: Normal rate and regular rhythm.     Pulses: Normal pulses.     Heart sounds: Normal heart sounds.  Pulmonary:     Effort: Pulmonary effort is normal.     Breath sounds: Normal breath sounds.  Neurological:     General: No focal deficit present.     Mental Status: He is alert and oriented to person, place, and time.  Psychiatric:        Mood and Affect: Mood normal.        Behavior: Behavior normal.    LABORATORY DATA:  I have reviewed the labs as listed.  CBC Latest Ref Rng & Units 05/04/2021 03/24/2021 02/09/2021  WBC 4.0 - 10.5 K/uL 7.3 7.2 7.4  Hemoglobin 13.0 - 17.0 g/dL 13.5 13.4 13.3  Hematocrit 39.0 - 52.0 % 41.1 40.4 40.3  Platelets 150 - 400 K/uL 242 263 251   CMP Latest Ref Rng & Units 06/10/2021 05/04/2021 03/24/2021  Glucose 70 - 99 mg/dL - 93 102(H)  BUN 8 - 23 mg/dL - 15 15  Creatinine 0.61 - 1.24 mg/dL 0.90 0.78 0.86  Sodium 135 - 145 mmol/L - 130(L) 129(L)  Potassium 3.5 - 5.1 mmol/L - 4.8 4.9  Chloride 98 - 111 mmol/L - 97(L) 95(L)  CO2 22 - 32 mmol/L - 26 27  Calcium 8.9 - 10.3 mg/dL - 8.9 9.0  Total Protein 6.5 - 8.1 g/dL - 6.9 6.7  Total Bilirubin 0.3 - 1.2 mg/dL - 0.5 0.8  Alkaline Phos 38 - 126 U/L - 78 78   AST 15 - 41 U/L - 20 20  ALT 0 - 44 U/L - 18 19    DIAGNOSTIC IMAGING:  I have independently reviewed the scans and discussed with the patient. CT Chest W Contrast  Result Date: 06/12/2021 CLINICAL DATA:  Metastatic lung cancer restaging, ongoing chemotherapy EXAM: CT CHEST WITH CONTRAST TECHNIQUE: Multidetector CT imaging of the chest was performed during intravenous contrast administration. CONTRAST:  61m OMNIPAQUE IOHEXOL 350  MG/ML SOLN COMPARISON:  12/24/2020 FINDINGS: Cardiovascular: Aortic atherosclerosis. Normal heart size. Three-vessel coronary artery calcifications. No pericardial effusion. Mediastinum/Nodes: No enlarged mediastinal, hilar, or axillary lymph nodes. Thyroid gland, trachea, and esophagus demonstrate no significant findings. Lungs/Pleura: Moderate centrilobular emphysema. Diffuse bilateral bronchial wall thickening. Unchanged post treatment appearance of a mass of the central right upper lobe measuring 2.9 x 2.3 cm (series 4, image 47). There is a new, spiculated nodule of the posterior right upper lobe, measuring 2.0 x 1.1 cm (series 4, image 58). New, spiculated nodule of the posterior left pulmonary apex measuring 1.5 x 0.9 cm (series 4, image 35). New small rounded nodule of the superior segment left lower lobe measuring 0.5 cm (series 4, image 66). Unchanged subpleural nodule of the dependent superior segment left lower lobe measuring 0.9 cm (series 4, image 71). Unchanged subpleural nodule of the medial segment right middle lobe measuring 0.4 cm (series 4, image 114). No pleural effusion or pneumothorax. Upper Abdomen: No acute abnormality. Unchanged thickening of the left adrenal gland without discrete nodule (series 2, image 147). Musculoskeletal: No chest wall mass or suspicious bone lesions identified. IMPRESSION: 1. Unchanged post treatment appearance of a mass of the central right upper lobe. 2. Multiple new spiculated and rounded nodules bilaterally, highly concerning  for metastatic disease. Infection or inflammation are not however strictly excluded. 3. Additional small pulmonary nodules are stable and nonspecific, likely benign sequelae of prior infection or inflammation. Attention on follow-up. 4. Unchanged thickening of the left adrenal gland, likely benign and incidental adenomatous hypertrophy. Attention on follow-up. 5. Emphysema and diffuse bilateral bronchial wall thickening. 6. Coronary artery disease. Aortic Atherosclerosis (ICD10-I70.0) and Emphysema (ICD10-J43.9). Electronically Signed   By: Eddie Candle M.D.   On: 06/12/2021 14:13     ASSESSMENT:  1.  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 -Mutations were negative for EGFR, ALK, ROS1, RET, BRAF V600 -PD-L1 22 C3 greater than 95%. -He was started on single agent pembrolizumab every 3 weeks under the direction of Dr. Dola Factor in [place] in August 2020, later switched to every 6 weeks. -We will consider testing for K-ras G 12 C, NTR K and met exon 14 mutations upon progression.   2.  Social/family history: -He is a retired Administrator.  Quit smoking in August 2020. -Father had prostate cancer.   PLAN:  1.  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  Chest x-ray did not show any major pneumonitis. - He does not report any immunotherapy related side effects. - Reviewed CT chest with contrast from 06/10/2021.  Unchanged central right upper lobe mass measuring 2.9 x 2.3 cm.  New spiculated nodule of the posterior right upper lobe measuring 2 x 1.1 cm.  New spiculated nodule of the posterior left upper lobe measuring 1.5 x 0.9 cm.  New small round nodule in the left lower lobe measuring 0.5 cm.  Other small nodules are unchanged.  No acute abnormality in the abdomen.  Unchanged  thickening of the left adrenal gland without discrete nodules. - We discussed differential diagnosis of the new nodules in the right and left lung.  It is not clear that they are malignant.  He does not report any new cough or worsening shortness of breath. - Recommended trial of antibiotics with  Levaquin for 2 weeks. - Reviewed his labs which showed normal CBC and LFTs.  TSH was 2.253.  We will proceed with Keytruda today. - RTC 6 weeks for follow-up.  Plan to repeat CT scan of the chest with contrast at that time.   2.  Brain metastasis: - No prior radiation therapy. - MRI of the brain on 12/24/2020 showed no evidence of metastatic disease.  Chronically seen sclerotic focus within the C3 vertebral body, grossly normal from 2018. - No signs of clinical recurrence at this time.  Will consider repeating MRI of the brain in March 2023.   3.  COPD: - Continue Breo Ellipta once daily and Ventolin as needed.   Orders placed this encounter:  No orders of the defined types were placed in this encounter.    [name], MD [place] [phone]   I, Thana Ates, am acting as a scribe for Dr. [name].  I, [name] MD, have reviewed the above documentation for accuracy and completeness, and I agree with the above.

## 2021-06-15 ENCOUNTER — Other Ambulatory Visit: Payer: Self-pay

## 2021-06-15 ENCOUNTER — Inpatient Hospital Stay (HOSPITAL_BASED_OUTPATIENT_CLINIC_OR_DEPARTMENT_OTHER): Payer: Medicare Other | Admitting: Hematology

## 2021-06-15 ENCOUNTER — Inpatient Hospital Stay (HOSPITAL_COMMUNITY): Payer: Medicare Other

## 2021-06-15 ENCOUNTER — Inpatient Hospital Stay (HOSPITAL_COMMUNITY): Payer: Medicare Other | Attending: Hematology

## 2021-06-15 VITALS — BP 122/71 | HR 83 | Temp 98.4°F | Resp 18

## 2021-06-15 VITALS — BP 119/69 | HR 81 | Temp 98.4°F | Resp 16 | Wt 153.1 lb

## 2021-06-15 DIAGNOSIS — C3411 Malignant neoplasm of upper lobe, right bronchus or lung: Secondary | ICD-10-CM | POA: Diagnosis present

## 2021-06-15 DIAGNOSIS — C7931 Secondary malignant neoplasm of brain: Secondary | ICD-10-CM | POA: Diagnosis not present

## 2021-06-15 DIAGNOSIS — Z79899 Other long term (current) drug therapy: Secondary | ICD-10-CM | POA: Insufficient documentation

## 2021-06-15 DIAGNOSIS — J432 Centrilobular emphysema: Secondary | ICD-10-CM | POA: Diagnosis not present

## 2021-06-15 DIAGNOSIS — M049 Autoinflammatory syndrome, unspecified: Secondary | ICD-10-CM | POA: Diagnosis not present

## 2021-06-15 DIAGNOSIS — C3491 Malignant neoplasm of unspecified part of right bronchus or lung: Secondary | ICD-10-CM

## 2021-06-15 DIAGNOSIS — Z5112 Encounter for antineoplastic immunotherapy: Secondary | ICD-10-CM | POA: Diagnosis present

## 2021-06-15 LAB — COMPREHENSIVE METABOLIC PANEL
ALT: 18 U/L (ref 0–44)
AST: 21 U/L (ref 15–41)
Albumin: 4.1 g/dL (ref 3.5–5.0)
Alkaline Phosphatase: 84 U/L (ref 38–126)
Anion gap: 9 (ref 5–15)
BUN: 13 mg/dL (ref 8–23)
CO2: 24 mmol/L (ref 22–32)
Calcium: 8.8 mg/dL — ABNORMAL LOW (ref 8.9–10.3)
Chloride: 98 mmol/L (ref 98–111)
Creatinine, Ser: 0.84 mg/dL (ref 0.61–1.24)
GFR, Estimated: >60 mL/min (ref >60)
Glucose, Bld: 95 mg/dL (ref 70–99)
Potassium: 4.5 mmol/L (ref 3.5–5.1)
Sodium: 131 mmol/L — ABNORMAL LOW (ref 135–145)
Total Bilirubin: 0.3 mg/dL (ref 0.3–1.2)
Total Protein: 7.1 g/dL (ref 6.5–8.1)

## 2021-06-15 LAB — CBC WITH DIFFERENTIAL/PLATELET
Abs Immature Granulocytes: 0.03 10*3/uL (ref 0.00–0.07)
Basophils Absolute: 0.1 10*3/uL (ref 0.0–0.1)
Basophils Relative: 1 %
Eosinophils Absolute: 0.5 10*3/uL (ref 0.0–0.5)
Eosinophils Relative: 6 %
HCT: 41.8 % (ref 39.0–52.0)
Hemoglobin: 14 g/dL (ref 13.0–17.0)
Immature Granulocytes: 0 %
Lymphocytes Relative: 18 %
Lymphs Abs: 1.5 10*3/uL (ref 0.7–4.0)
MCH: 31.9 pg (ref 26.0–34.0)
MCHC: 33.5 g/dL (ref 30.0–36.0)
MCV: 95.2 fL (ref 80.0–100.0)
Monocytes Absolute: 0.8 10*3/uL (ref 0.1–1.0)
Monocytes Relative: 9 %
Neutro Abs: 5.4 10*3/uL (ref 1.7–7.7)
Neutrophils Relative %: 66 %
Platelets: 286 10*3/uL (ref 150–400)
RBC: 4.39 MIL/uL (ref 4.22–5.81)
RDW: 12.2 % (ref 11.5–15.5)
WBC: 8.3 10*3/uL (ref 4.0–10.5)
nRBC: 0 % (ref 0.0–0.2)

## 2021-06-15 LAB — TSH: TSH: 2.253 u[IU]/mL (ref 0.350–4.500)

## 2021-06-15 LAB — MAGNESIUM: Magnesium: 2.2 mg/dL (ref 1.7–2.4)

## 2021-06-15 MED ORDER — LEVOFLOXACIN 500 MG PO TABS: 500.0000 mg | ORAL_TABLET | Freq: Every day | ORAL | 0 refills | Status: DC

## 2021-06-15 MED ORDER — SODIUM CHLORIDE 0.9 % IV SOLN: Freq: Once | INTRAVENOUS | Status: AC

## 2021-06-15 MED ORDER — SODIUM CHLORIDE 0.9 % IV SOLN
400.0000 mg | Freq: Once | INTRAVENOUS | Status: AC
  Administered 2021-06-15: 400 mg via INTRAVENOUS
  Filled 2021-06-15: qty 16

## 2021-06-15 NOTE — Patient Instructions (Addendum)
[place] at [facility] Discharge Instructions  You were seen today by Dr. Delton Coombes. He went over your recent results and scans. You will be scheduled for a CT scan of your chest prior to your next visit. Dr. Delton Coombes will see you back in 6 weeks for labs and follow up.   Thank you for choosing [place] at [facility] to provide your oncology and hematology care.  To afford each patient quality time with our provider, please arrive at least 15 minutes before your scheduled appointment time.   If you have a lab appointment with the [facility] please come in thru the Main Entrance and check in at the main information desk  You need to re-schedule your appointment should you arrive 10 or more minutes late.  We strive to give you quality time with our providers, and arriving late affects you and other patients whose appointments are after yours.  Also, if you no show three or more times for appointments you may be dismissed from the clinic at the providers discretion.     Again, thank you for choosing [facility].  Our hope is that these requests will decrease the amount of time that you wait before being seen by our physicians.       _____________________________________________________________  Should you have questions after your visit to [facility], please contact our office at [phone] between the hours of 8:00 a.m. and 4:30 p.m.  Voicemails left after 4:00 p.m. will not be returned until the following business day.  For prescription refill requests, have your pharmacy contact our office and allow 72 hours.    Cancer Center Support Programs:   > Cancer Support Group  2nd Tuesday of the month 1pm-2pm, Journey Room

## 2021-06-15 NOTE — Progress Notes (Signed)
Patient presents today for treatment and follow up visit with Dr. Delton Coombes. Vital signs stable and within parameters for treatment. Labs within parameters for treatment.   Treatment given today per MD orders. Tolerated infusion without adverse affects. Vital signs stable. No complaints at this time. Discharged from clinic ambulatory in stable condition. Alert and oriented x 3. F/U with [facility] as scheduled.

## 2021-06-15 NOTE — Progress Notes (Signed)
Patient has been assessed, vital signs and labs have been reviewed by Dr. Katragadda. ANC, Creatinine, LFTs, and Platelets are within treatment parameters per Dr. Katragadda. The patient is good to proceed with treatment at this time. Primary RN and pharmacy aware.  

## 2021-06-15 NOTE — Patient Instructions (Signed)
[facility]  Discharge Instructions: Thank you for choosing [facility] to provide your oncology and hematology care.  If you have a lab appointment with the [facility], please come in thru the Main Entrance and check in at the main information desk.  Wear comfortable clothing and clothing appropriate for easy access to any Portacath or PICC line.   We strive to give you quality time with your provider. You may need to reschedule your appointment if you arrive late (15 or more minutes).  Arriving late affects you and other patients whose appointments are after yours.  Also, if you miss three or more appointments without notifying the office, you may be dismissed from the clinic at the provider's discretion.      For prescription refill requests, have your pharmacy contact our office and allow 72 hours for refills to be completed.    Today you received the following chemotherapy and/or immunotherapy agents Keytruda.       To help prevent nausea and vomiting after your treatment, we encourage you to take your nausea medication as directed.  BELOW ARE SYMPTOMS THAT SHOULD BE REPORTED IMMEDIATELY: *FEVER GREATER THAN 100.4 F (38 C) OR HIGHER *CHILLS OR SWEATING *NAUSEA AND VOMITING THAT IS NOT CONTROLLED WITH YOUR NAUSEA MEDICATION *UNUSUAL SHORTNESS OF BREATH *UNUSUAL BRUISING OR BLEEDING *URINARY PROBLEMS (pain or burning when urinating, or frequent urination) *BOWEL PROBLEMS (unusual diarrhea, constipation, pain near the anus) TENDERNESS IN MOUTH AND THROAT WITH OR WITHOUT PRESENCE OF ULCERS (sore throat, sores in mouth, or a toothache) UNUSUAL RASH, SWELLING OR PAIN  UNUSUAL VAGINAL DISCHARGE OR ITCHING   Items with * indicate a potential emergency and should be followed up as soon as possible or go to the Emergency Department if any problems should occur.  Please show the CHEMOTHERAPY ALERT CARD or IMMUNOTHERAPY ALERT CARD at check-in to the Emergency  Department and triage nurse.  Should you have questions after your visit or need to cancel or reschedule your appointment, please contact [facility] [phone]  and follow the prompts.  Office hours are 8:00 a.m. to 4:30 p.m. Monday - Friday. Please note that voicemails left after 4:00 p.m. may not be returned until the following business day.  We are closed weekends and major holidays. You have access to a nurse at all times for urgent questions. Please call the main number to the clinic [phone] and follow the prompts.  For any non-urgent questions, you may also contact your provider using MyChart. We now offer e-Visits for anyone 65 and older to request care online for non-urgent symptoms. For details visit [email or web address].   Also download the MyChart app! Go to the app store, search "MyChart", open the app, select [facility], and log in with your MyChart username and password.  Due to Covid, a mask is required upon entering the hospital/clinic. If you do not have a mask, one will be given to you upon arrival. For doctor visits, patients may have 1 support person aged 65 or older with them. For treatment visits, patients cannot have anyone with them due to current Covid guidelines and our immunocompromised population.

## 2021-07-18 ENCOUNTER — Other Ambulatory Visit: Payer: Self-pay

## 2021-07-18 ENCOUNTER — Ambulatory Visit (HOSPITAL_COMMUNITY)
Admission: RE | Admit: 2021-07-18 | Discharge: 2021-07-18 | Disposition: A | Discharge status: Home / Self Care | Payer: Medicare Other | Source: Ambulatory Visit | Attending: Hematology | Admitting: Hematology

## 2021-07-18 DIAGNOSIS — C3491 Malignant neoplasm of unspecified part of right bronchus or lung: Secondary | ICD-10-CM | POA: Diagnosis present

## 2021-07-18 LAB — POCT I-STAT CREATININE: Creatinine, Ser: 0.9 mg/dL (ref 0.61–1.24)

## 2021-07-18 IMAGING — CT CT CHEST W/ CM
2 of 3 series · 15 of 36 positions shown, 18 images · IV contrast (Omnipaque or Isovue)
Comparison: [DATE]

CLINICAL DATA: Right upper lobe adenocarcinoma with brain
metastasis. Currently on immunotherapy.

EXAM:
CT CHEST WITH CONTRAST
TECHNIQUE: Multidetector CT imaging of the chest was performed during
intravenous contrast administration.
CONTRAST:  75mL OMNIPAQUE IOHEXOL 300 MG/ML  SOLN

[Series 2: routine chest with · axial · 0.66mm/px · z∈[+1156,+1464]mm · 12 of 182 slices shown, 15 images]
[im 14/182  mediastinal]
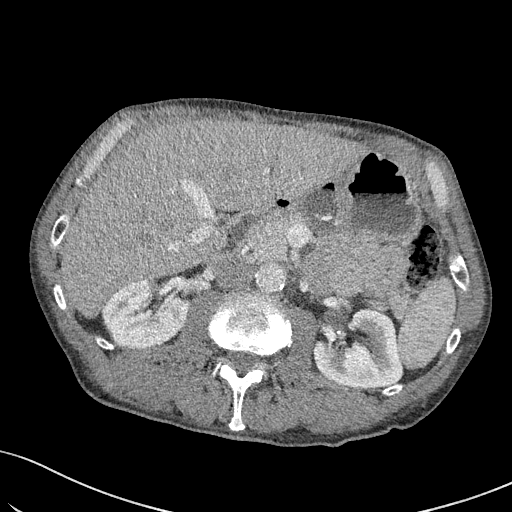
[im 14/182  lung]
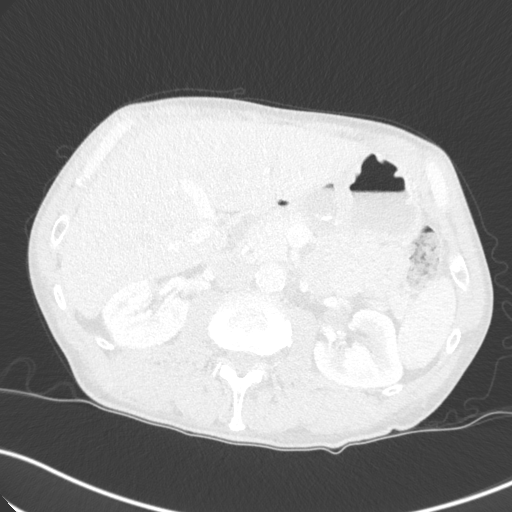
[im 27/182  lung]
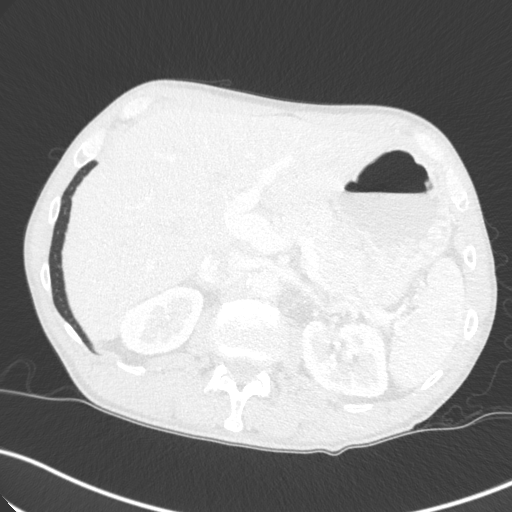
[im 41/182  lung]
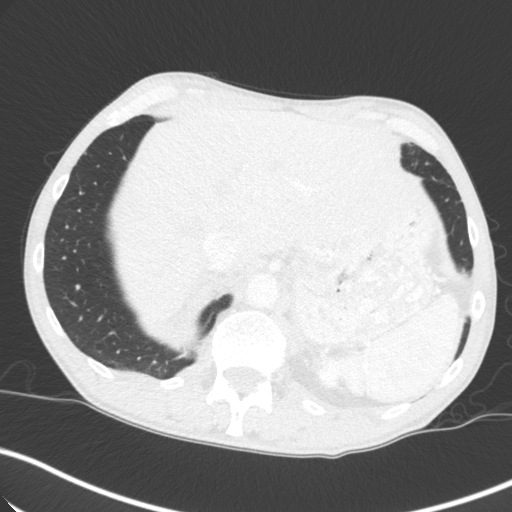
[im 54/182  lung]
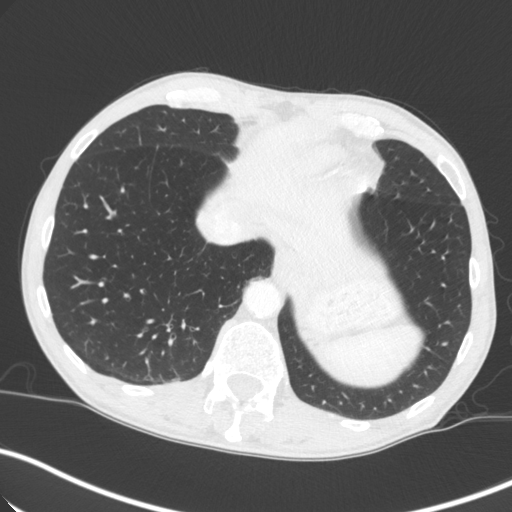
[im 68/182  mediastinal]
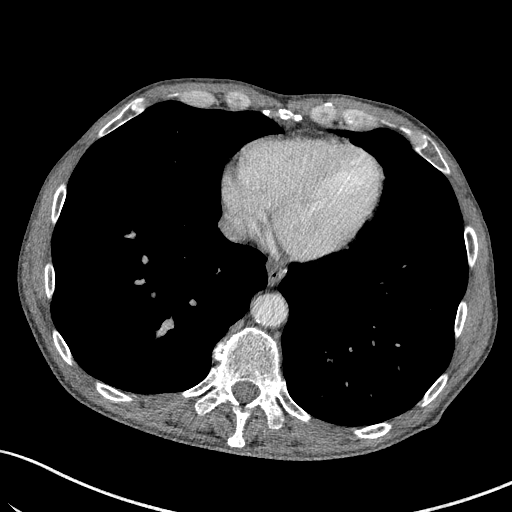
[im 68/182  lung]
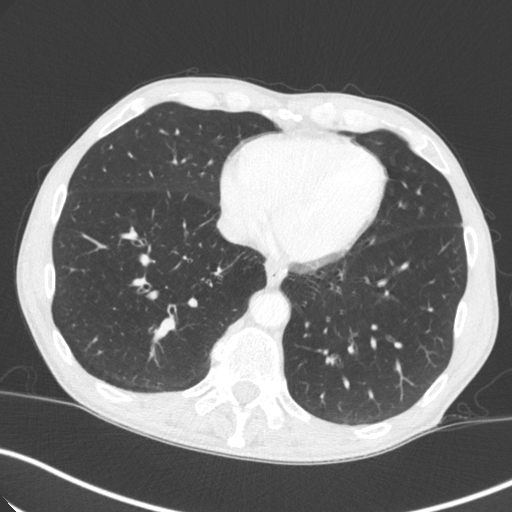
[im 81/182  lung]
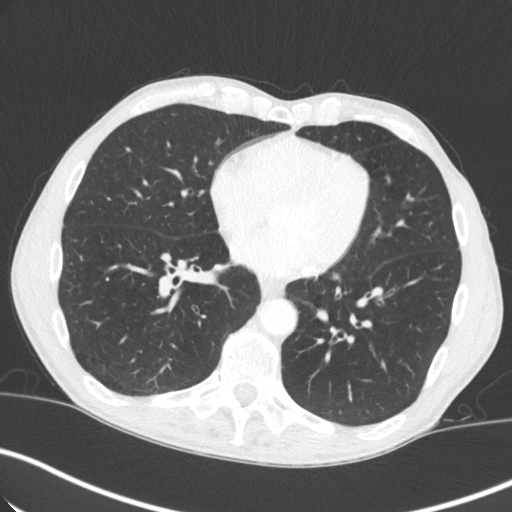
[im 101/182  lung]
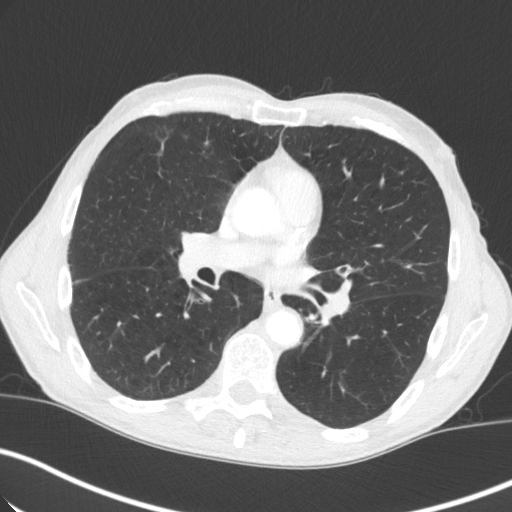
[im 114/182  lung]
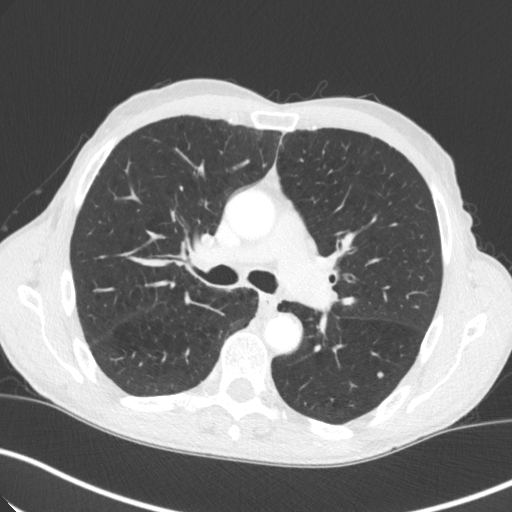
[im 128/182  mediastinal]
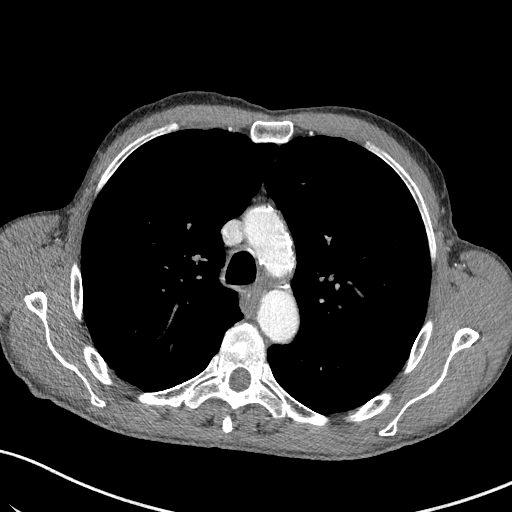
[im 128/182  lung]
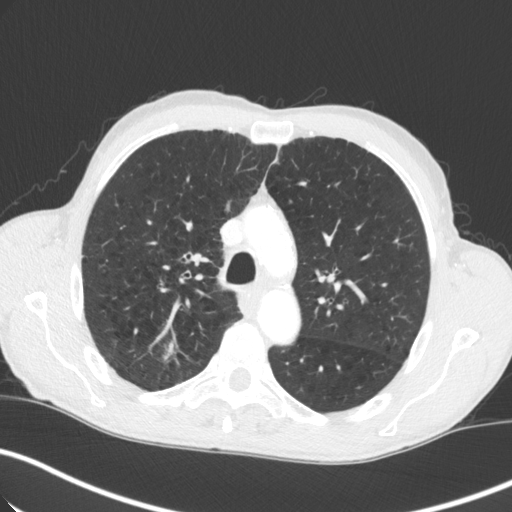
[im 141/182  lung]
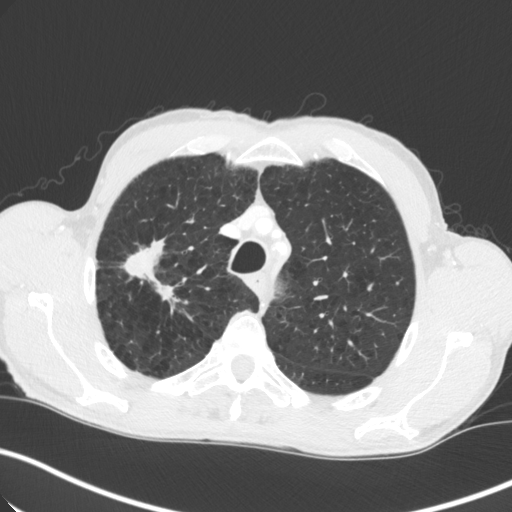
[im 155/182  lung]
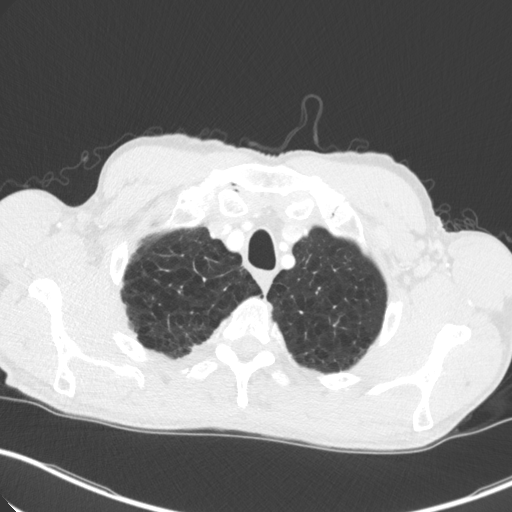
[im 168/182  lung]
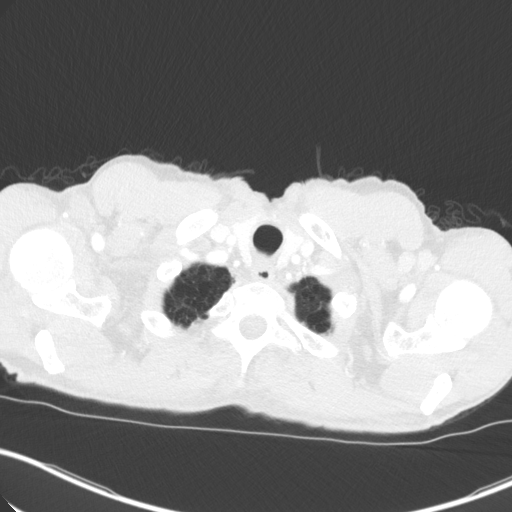

[Series 5: coronal · coronal · 0.72mm/px · 3 of 134 slices shown]
[im 27/134  lung]
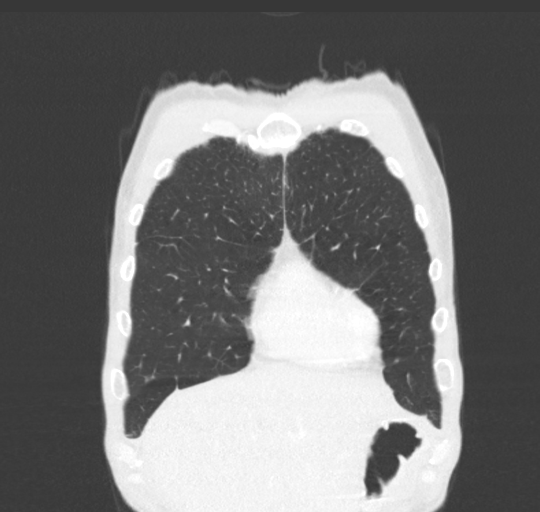
[im 54/134  lung]
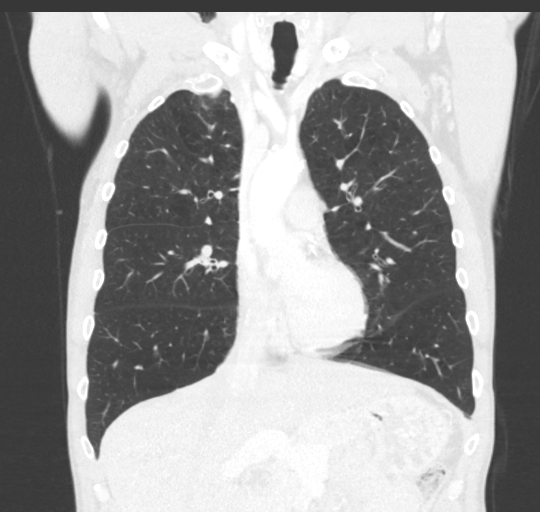
[im 80/134  lung]
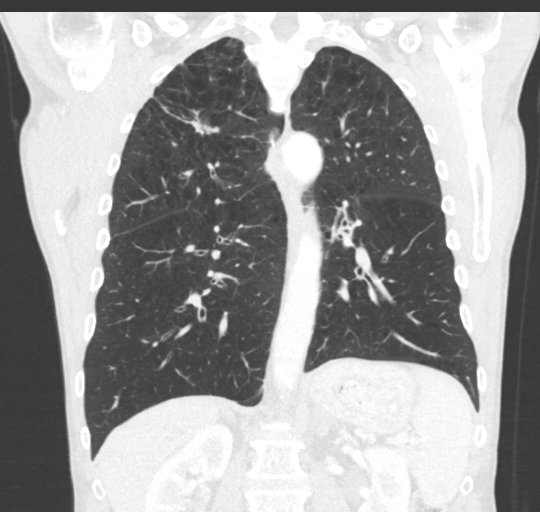

[15 of 36 positions shown; findings below may reference images not displayed]

FINDINGS: Cardiovascular: Advanced aortic and branch vessel atherosclerosis.
Normal heart size, without pericardial effusion. Three vessel
coronary artery calcification.

Mediastinum/Nodes: No supraclavicular adenopathy. No axillary
adenopathy. 9 mm left subpectoral node on [DATE] is similar to on the
prior exam, but may have enlarged minimally from 8 mm on [DATE].
When comparing back to a outside CT of the chest of [DATE], this
node is new. No mediastinal or hilar adenopathy.

Lungs/Pleura: No pleural fluid.  Moderate centrilobular emphysema.

Lower lobe predominant bronchial wall thickening.

Residual right upper lobe lung mass which measures 3.0 x 3.6 cm on
42/4, similar to 2.9 x 3.9 cm on the prior exam (when remeasured).

The posterior right upper lobe 2.0 cm nodule on the prior exam is
nearly completely resolved, with only mild interstitial thickening
on 55/4 remaining.

Posterior left upper lobe pulmonary nodule of 1.0 x 0.7 cm on 34/4
measures similar to 1.0 x 0.7 cm on the prior exam (when
remeasured). There is resolution of surrounding mild ground-glass,
suggests this may be infectious or inflammatory.

1.0 cm superior segment left lower lobe subpleural nodule on 75/4 is
similar to on the prior exam.

More lateral superior segment left lower lobe pulmonary nodule
measures 4 mm on 69/4 versus 5 mm on the prior.

Subpleural right middle lobe 4 mm nodule on 107/4 is unchanged.

Upper Abdomen: Normal imaged portions of the liver, spleen, stomach,
pancreas, right adrenal gland, kidneys. Left adrenal thickening and
nodularity are present back to [DATE], favoring hyperplasia an
underlying small adenomas.

Musculoskeletal: Osteopenia.
IMPRESSION: 1. Since the [DATE] CT, 1 of the 3 new nodules on that exam
(posterior right upper lobe) has nearly completely resolved. A left
upper lobe nodule measures similarly but has less surrounding
ground-glass. A superior segment left lower lobe nodule may have
minimally decreased in size. Constellation of findings suggests
these nodules may be infectious/inflammatory. Consider 3 to
six-month follow-up chest CT for further evaluation.
2. Other pulmonary nodules are unchanged.
3. Otherwise, similar appearance of right upper lobe lung mass,
likely treated primary.
4. An isolated enlarged left subpectoral node is similar in size to
on the prior exam, but newly enlarged when compared back to remote
outside CT of [DATE]. Cannot exclude an atypical distribution of
metastatic disease. Recommend attention on follow-up.
5. Aortic atherosclerosis ([RO]-[RO]), coronary artery
atherosclerosis and emphysema ([RO]-[RO]).
6. Similar appearance of the left adrenal gland, favoring
hyperplasia.

## 2021-07-18 MED ORDER — IOHEXOL 300 MG/ML  SOLN
75.0000 mL | Freq: Once | INTRAMUSCULAR | Status: AC | PRN
  Administered 2021-07-18: 75 mL via INTRAVENOUS

## 2021-07-26 NOTE — Progress Notes (Signed)
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  PRIOR THERAPY: none  NGS Results: PD-L1 TPS >95%; EGFR, ALK, ROS1, RET, BRAF V600E negative  CURRENT THERAPY: Keytruda every 6 weeks  BRIEF ONCOLOGIC HISTORY:  Oncology History  Adenocarcinoma of lung, stage 4, right ([place])  12/08/2020 Initial Diagnosis   Adenocarcinoma of lung, stage 4, right ([place])   12/08/2020 Cancer Staging   Staging form: Lung, AJCC 8th Edition - Clinical stage from 12/08/2020: Stage IVB (cT2b, cN3, pM1c) - Signed by [name], MD on 12/08/2020 Histopathologic type: Adenocarcinoma, NOS    12/29/2020 -  Chemotherapy    Patient is on Treatment Plan: LUNG PEMBROLIZUMAB Q42D         CANCER STAGING: Cancer Staging Adenocarcinoma of lung, stage 4, right [facility]) Staging form: Lung, AJCC 8th Edition - Clinical stage from 12/08/2020: Stage IVB (cT2b, cN3, pM1c) - Signed by [name], MD on 12/08/2020   INTERVAL HISTORY:  [name], a 65 y.o. male, returns for routine follow-up and consideration for next cycle of chemotherapy. Antonio Payne was last seen on 06/15/2021.  Due for cycle #6 of Keytruda today.   Overall, Antonio Payne tells me Antonio Payne has been feeling pretty well. His appetite is good, and Antonio Payne denies SOB, nausea, and vomiting. Antonio Payne reports occasional cough productive of white and yellow sputum.   Overall, Antonio Payne feels ready for next cycle of chemo today.   REVIEW OF SYSTEMS:  Review of Systems  Constitutional:  Negative for appetite change and fatigue (80%).  Respiratory:  Positive for cough. Negative for shortness of breath.   Gastrointestinal:  Negative for nausea and vomiting.  All other systems reviewed and are negative.  PAST MEDICAL/SURGICAL HISTORY:  Past Medical History:  Diagnosis Date   Cancer ([place])    lung cancer     Past Surgical History:  Procedure Laterality Date   BACK SURGERY      SOCIAL HISTORY:  Social History   Socioeconomic History   Marital status: Single    Spouse name: Not on file   Number of children: Not on file   Years of education: Not on file   Highest education level: Not on file  Occupational History   Not on file  Tobacco Use   Smoking status: Former    Types: Cigarettes    Quit date: 12/01/2019    Years since quitting: 1.6   Smokeless tobacco: Never  Substance and Sexual Activity   Alcohol use: Never   Drug use: Never   Sexual activity: Not Currently  Other Topics Concern   Not on file  Social History Narrative   Not on file   Social Determinants of Health   Financial Resource Strain: Low Risk    Difficulty of Paying Living Expenses: Not hard at all  Food Insecurity: No Food Insecurity   Worried About Charity fundraiser in the Last Year: Never true   [place] in the Last Year: Never true  Transportation Needs: No Transportation Needs   Lack of Transportation (Medical): No   Lack of Transportation (Non-Medical): No  Physical Activity: Sufficiently Active   Days of Exercise per Week: 7 days   Minutes of Exercise per Session: 30 min  Stress: No Stress Concern Present   Feeling of Stress : Only a little  Social Connections: Moderately  Integrated   Frequency of Communication with Friends and Family: More than three times a week   Frequency of Social Gatherings with Friends and Family: More than three times a week   Attends Religious Services: 1 to 4 times per year   Active Member of [employer] or Organizations: No   Attends Music therapist: 1 to 4 times per year   Marital Status: Divorced  Human resources officer Violence: Not At Risk   Fear of Current or Ex-Partner: No   Emotionally Abused: No   Physically Abused: No   Sexually Abused: No    FAMILY HISTORY:  No family history on file.  CURRENT MEDICATIONS:  Current Outpatient Medications   Medication Sig Dispense Refill   albuterol (VENTOLIN HFA) 108 (90 Base) MCG/ACT inhaler Inhale 2 puffs into the lungs every 4 (four) hours as needed for wheezing or shortness of breath. (Patient not taking: Reported on 06/15/2021) 8 g 6   fluticasone furoate-vilanterol (BREO ELLIPTA) 200-25 MCG/INH AEPB Inhale 1 puff into the lungs every morning. 1 each 6   levofloxacin (LEVAQUIN) 500 MG tablet Take 1 tablet (500 mg total) by mouth daily. 14 tablet 0   No current facility-administered medications for this visit.    ALLERGIES:  No Known Allergies  PHYSICAL EXAM:  Performance status (ECOG): 1 - Symptomatic but completely ambulatory  There were no vitals filed for this visit. Wt Readings from Last 3 Encounters:  06/15/21 153 lb 1.6 oz (69.4 kg)  05/04/21 149 lb (67.6 kg)  02/09/21 153 lb 1.6 oz (69.4 kg)   Physical Exam Vitals reviewed.  Constitutional:      Appearance: Normal appearance.  Cardiovascular:     Rate and Rhythm: Normal rate and regular rhythm.     Pulses: Normal pulses.     Heart sounds: Normal heart sounds.  Pulmonary:     Effort: Pulmonary effort is normal.     Breath sounds: Normal breath sounds.  Neurological:     General: No focal deficit present.     Mental Status: Antonio Payne is alert and oriented to person, place, and time.  Psychiatric:        Mood and Affect: Mood normal.        Behavior: Behavior normal.    LABORATORY DATA:  I have reviewed the labs as listed.  CBC Latest Ref Rng & Units 06/15/2021 05/04/2021 03/24/2021  WBC 4.0 - 10.5 K/uL 8.3 7.3 7.2  Hemoglobin 13.0 - 17.0 g/dL 14.0 13.5 13.4  Hematocrit 39.0 - 52.0 % 41.8 41.1 40.4  Platelets 150 - 400 K/uL 286 242 263   CMP Latest Ref Rng & Units 07/18/2021 06/15/2021 06/10/2021  Glucose 70 - 99 mg/dL - 95 -  BUN 8 - 23 mg/dL - 13 -  Creatinine 0.61 - 1.24 mg/dL 0.90 0.84 0.90  Sodium 135 - 145 mmol/L - 131(L) -  Potassium 3.5 - 5.1 mmol/L - 4.5 -  Chloride 98 - 111 mmol/L - 98 -  CO2 22 - 32 mmol/L -  24 -  Calcium 8.9 - 10.3 mg/dL - 8.8(L) -  Total Protein 6.5 - 8.1 g/dL - 7.1 -  Total Bilirubin 0.3 - 1.2 mg/dL - 0.3 -  Alkaline Phos 38 - 126 U/L - 84 -  AST 15 - 41 U/L - 21 -  ALT 0 - 44 U/L - 18 -    DIAGNOSTIC IMAGING:  I have independently reviewed the scans and discussed with the patient. CT Chest W Contrast  Result Date: 07/19/2021  CLINICAL DATA:  Right upper lobe adenocarcinoma with brain metastasis. Currently on immunotherapy. EXAM: CT CHEST WITH CONTRAST TECHNIQUE: Multidetector CT imaging of the chest was performed during intravenous contrast administration. CONTRAST:  75m OMNIPAQUE IOHEXOL 300 MG/ML  SOLN COMPARISON:  06/10/2021 FINDINGS: Cardiovascular: Advanced aortic and branch vessel atherosclerosis. Normal heart size, without pericardial effusion. Three vessel coronary artery calcification. Mediastinum/Nodes: No supraclavicular adenopathy. No axillary adenopathy. 9 mm left subpectoral node on 27/2 is similar to on the prior exam, but may have enlarged minimally from 8 mm on 12/24/2020. When comparing back to a outside CT of the chest of 05/07/2019, this node is new. No mediastinal or hilar adenopathy. Lungs/Pleura: No pleural fluid.  Moderate centrilobular emphysema. Lower lobe predominant bronchial wall thickening. Residual right upper lobe lung mass which measures 3.0 x 3.6 cm on 42/4, similar to 2.9 x 3.9 cm on the prior exam (when remeasured). The posterior right upper lobe 2.0 cm nodule on the prior exam is nearly completely resolved, with only mild interstitial thickening on 55/4 remaining. Posterior left upper lobe pulmonary nodule of 1.0 x 0.7 cm on 34/4 measures similar to 1.0 x 0.7 cm on the prior exam (when remeasured). There is resolution of surrounding mild ground-glass, suggests this may be infectious or inflammatory. 1.0 cm superior segment left lower lobe subpleural nodule on 75/4 is similar to on the prior exam. More lateral superior segment left lower lobe  pulmonary nodule measures 4 mm on 69/4 versus 5 mm on the prior. Subpleural right middle lobe 4 mm nodule on 107/4 is unchanged. Upper Abdomen: Normal imaged portions of the liver, spleen, stomach, pancreas, right adrenal gland, kidneys. Left adrenal thickening and nodularity are present back to 05/07/2019, favoring hyperplasia an underlying small adenomas. Musculoskeletal: Osteopenia. IMPRESSION: 1. Since the 06/12/2021 CT, 1 of the 3 new nodules on that exam (posterior right upper lobe) has nearly completely resolved. A left upper lobe nodule measures similarly but has less surrounding ground-glass. A superior segment left lower lobe nodule may have minimally decreased in size. Constellation of findings suggests these nodules may be infectious/inflammatory. Consider 3 to six-month follow-up chest CT for further evaluation. 2. Other pulmonary nodules are unchanged. 3. Otherwise, similar appearance of right upper lobe lung mass, likely treated primary. 4. An isolated enlarged left subpectoral node is similar in size to on the prior exam, but newly enlarged when compared back to remote outside CT of 05/07/2019. Cannot exclude an atypical distribution of metastatic disease. Recommend attention on follow-up. 5. Aortic atherosclerosis (ICD10-I70.0), coronary artery atherosclerosis and emphysema (ICD10-J43.9). 6. Similar appearance of the left adrenal gland, favoring hyperplasia. Electronically Signed   By: KAbigail MiyamotoM.D.   On: 07/19/2021 15:10     ASSESSMENT:  1.  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 -Mutations were negative for EGFR, ALK, ROS1, RET, BRAF V600 -PD-L1 22 C3 greater than 95%. -Antonio Payne was started on single agent pembrolizumab every 3 weeks under the direction of Dr. RDola Factorin MFordin August 2020, later switched to every 6 weeks. -We will consider  testing for K-ras G 12 C, NTR K and met exon 14 mutations upon progression.   2.  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 -Father had prostate cancer.   PLAN:  1.  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 - We have given him 2 weeks of Levaquin. - We reviewed CT chest with contrast from 07/18/2021.  One of the new nodules in the right upper lobe has completely resolved.  Another nodule in the left upper lobe has slightly decreased in size.  Left lower lobe nodule is stable.  These are consistent with infectious/inflammation.  Otherwise no new changes seen. - Antonio Payne does not have any immunotherapy related side effects.  Reviewed labs from today which shows normal LFTs and CBC.  TSH was 1.67. - Proceed with Keytruda today and in 6 weeks.  RTC 12 weeks for follow-up. - We will repeat CT of the chest with contrast prior to next visit.   2.  Brain metastasis: - No prior brain radiation therapy. - MRI of the brain on 12/23/2020 showed no evidence of metastatic disease. - No clinical signs of recurrence.  Will consider repeating MRI in March 2023.   3.  COPD: - Continue Breo Ellipta once daily and Ventolin as needed.   Orders placed this encounter:  No orders of the defined types were placed in this encounter.    [name], MD [facility] [phone]   I, Thana Ates, am acting as a scribe for Dr. [name].  I, [name] MD, have reviewed the above documentation for accuracy and completeness, and I agree with the above.

## 2021-07-27 ENCOUNTER — Other Ambulatory Visit: Payer: Self-pay

## 2021-07-27 ENCOUNTER — Inpatient Hospital Stay (HOSPITAL_COMMUNITY): Payer: Medicare Other

## 2021-07-27 ENCOUNTER — Inpatient Hospital Stay (HOSPITAL_BASED_OUTPATIENT_CLINIC_OR_DEPARTMENT_OTHER): Payer: Medicare Other | Admitting: Hematology

## 2021-07-27 ENCOUNTER — Inpatient Hospital Stay (HOSPITAL_COMMUNITY): Payer: Medicare Other | Attending: Hematology

## 2021-07-27 VITALS — BP 126/64 | HR 75 | Temp 98.0°F | Resp 18

## 2021-07-27 VITALS — BP 147/85 | HR 73 | Temp 98.3°F | Resp 18 | Wt 153.1 lb

## 2021-07-27 DIAGNOSIS — C7931 Secondary malignant neoplasm of brain: Secondary | ICD-10-CM | POA: Insufficient documentation

## 2021-07-27 DIAGNOSIS — M049 Autoinflammatory syndrome, unspecified: Secondary | ICD-10-CM

## 2021-07-27 DIAGNOSIS — C3491 Malignant neoplasm of unspecified part of right bronchus or lung: Secondary | ICD-10-CM | POA: Diagnosis not present

## 2021-07-27 DIAGNOSIS — C3411 Malignant neoplasm of upper lobe, right bronchus or lung: Secondary | ICD-10-CM | POA: Insufficient documentation

## 2021-07-27 DIAGNOSIS — Z79899 Other long term (current) drug therapy: Secondary | ICD-10-CM | POA: Diagnosis not present

## 2021-07-27 DIAGNOSIS — Z5112 Encounter for antineoplastic immunotherapy: Secondary | ICD-10-CM | POA: Diagnosis not present

## 2021-07-27 LAB — COMPREHENSIVE METABOLIC PANEL
ALT: 17 U/L (ref 0–44)
AST: 23 U/L (ref 15–41)
Albumin: 4.2 g/dL (ref 3.5–5.0)
Alkaline Phosphatase: 73 U/L (ref 38–126)
Anion gap: 5 (ref 5–15)
BUN: 13 mg/dL (ref 8–23)
CO2: 27 mmol/L (ref 22–32)
Calcium: 9.1 mg/dL (ref 8.9–10.3)
Chloride: 96 mmol/L — ABNORMAL LOW (ref 98–111)
Creatinine, Ser: 0.83 mg/dL (ref 0.61–1.24)
GFR, Estimated: >60 mL/min (ref >60)
Glucose, Bld: 86 mg/dL (ref 70–99)
Potassium: 4.6 mmol/L (ref 3.5–5.1)
Sodium: 128 mmol/L — ABNORMAL LOW (ref 135–145)
Total Bilirubin: 0.7 mg/dL (ref 0.3–1.2)
Total Protein: 7.1 g/dL (ref 6.5–8.1)

## 2021-07-27 LAB — TSH: TSH: 1.67 u[IU]/mL (ref 0.350–4.500)

## 2021-07-27 LAB — CBC WITH DIFFERENTIAL/PLATELET
Abs Immature Granulocytes: 0.02 10*3/uL (ref 0.00–0.07)
Basophils Absolute: 0.1 10*3/uL (ref 0.0–0.1)
Basophils Relative: 1 %
Eosinophils Absolute: 0.5 10*3/uL (ref 0.0–0.5)
Eosinophils Relative: 8 %
HCT: 41.2 % (ref 39.0–52.0)
Hemoglobin: 13.8 g/dL (ref 13.0–17.0)
Immature Granulocytes: 0 %
Lymphocytes Relative: 22 %
Lymphs Abs: 1.3 10*3/uL (ref 0.7–4.0)
MCH: 32.1 pg (ref 26.0–34.0)
MCHC: 33.5 g/dL (ref 30.0–36.0)
MCV: 95.8 fL (ref 80.0–100.0)
Monocytes Absolute: 0.8 10*3/uL (ref 0.1–1.0)
Monocytes Relative: 13 %
Neutro Abs: 3.3 10*3/uL (ref 1.7–7.7)
Neutrophils Relative %: 56 %
Platelets: 249 10*3/uL (ref 150–400)
RBC: 4.3 MIL/uL (ref 4.22–5.81)
RDW: 12.6 % (ref 11.5–15.5)
WBC: 5.9 10*3/uL (ref 4.0–10.5)
nRBC: 0 % (ref 0.0–0.2)

## 2021-07-27 LAB — MAGNESIUM: Magnesium: 2.3 mg/dL (ref 1.7–2.4)

## 2021-07-27 MED ORDER — SODIUM CHLORIDE 0.9 % IV SOLN: Freq: Once | INTRAVENOUS | Status: AC

## 2021-07-27 MED ORDER — SODIUM CHLORIDE 0.9% FLUSH: 10.0000 mL | INTRAVENOUS | Status: DC | PRN

## 2021-07-27 MED ORDER — SODIUM CHLORIDE 0.9 % IV SOLN
400.0000 mg | Freq: Once | INTRAVENOUS | Status: AC
  Administered 2021-07-27: 400 mg via INTRAVENOUS
  Filled 2021-07-27: qty 16

## 2021-07-27 MED ORDER — HEPARIN SOD (PORK) LOCK FLUSH 100 UNIT/ML IV SOLN: 500.0000 [IU] | Freq: Once | INTRAVENOUS | Status: DC | PRN

## 2021-07-27 NOTE — Progress Notes (Signed)
Patient has been examined, vital signs and labs have been reviewed by Dr. Delton Coombes. ANC, Creatinine, LFTs, hemoglobin, and platelets are within treatment parameters per Dr. Delton Coombes. Patient may proceed with treatment per M.D.

## 2021-07-27 NOTE — Patient Instructions (Signed)
[facility]  Discharge Instructions: Thank you for choosing [facility] to provide your oncology and hematology care.  If you have a lab appointment with the [facility], please come in thru the Main Entrance and check in at the main information desk.  Wear comfortable clothing and clothing appropriate for easy access to any Portacath or PICC line.   We strive to give you quality time with your provider. You may need to reschedule your appointment if you arrive late (15 or more minutes).  Arriving late affects you and other patients whose appointments are after yours.  Also, if you miss three or more appointments without notifying the office, you may be dismissed from the clinic at the provider's discretion.      For prescription refill requests, have your pharmacy contact our office and allow 72 hours for refills to be completed.    Today you received the following chemotherapy and/or immunotherapy agents Keytruda.   To help prevent nausea and vomiting after your treatment, we encourage you to take your nausea medication as directed.  BELOW ARE SYMPTOMS THAT SHOULD BE REPORTED IMMEDIATELY: *FEVER GREATER THAN 100.4 F (38 C) OR HIGHER *CHILLS OR SWEATING *NAUSEA AND VOMITING THAT IS NOT CONTROLLED WITH YOUR NAUSEA MEDICATION *UNUSUAL SHORTNESS OF BREATH *UNUSUAL BRUISING OR BLEEDING *URINARY PROBLEMS (pain or burning when urinating, or frequent urination) *BOWEL PROBLEMS (unusual diarrhea, constipation, pain near the anus) TENDERNESS IN MOUTH AND THROAT WITH OR WITHOUT PRESENCE OF ULCERS (sore throat, sores in mouth, or a toothache) UNUSUAL RASH, SWELLING OR PAIN  UNUSUAL VAGINAL DISCHARGE OR ITCHING   Items with * indicate a potential emergency and should be followed up as soon as possible or go to the Emergency Department if any problems should occur.  Please show the CHEMOTHERAPY ALERT CARD or IMMUNOTHERAPY ALERT CARD at check-in to the Emergency  Department and triage nurse.  Should you have questions after your visit or need to cancel or reschedule your appointment, please contact [facility] [phone]  and follow the prompts.  Office hours are 8:00 a.m. to 4:30 p.m. Monday - Friday. Please note that voicemails left after 4:00 p.m. may not be returned until the following business day.  We are closed weekends and major holidays. You have access to a nurse at all times for urgent questions. Please call the main number to the clinic [phone] and follow the prompts.  For any non-urgent questions, you may also contact your provider using MyChart. We now offer e-Visits for anyone 29 and older to request care online for non-urgent symptoms. For details visit [email or web address].   Also download the MyChart app! Go to the app store, search "MyChart", open the app, select [facility], and log in with your MyChart username and password.  Due to Covid, a mask is required upon entering the hospital/clinic. If you do not have a mask, one will be given to you upon arrival. For doctor visits, patients may have 1 support person aged 78 or older with them. For treatment visits, patients cannot have anyone with them due to current Covid guidelines and our immunocompromised population.

## 2021-07-27 NOTE — Patient Instructions (Addendum)
[place] at [facility] Discharge Instructions   You were seen and examined today by Dr. Delton Coombes. We will proceed with Keytruda infusion today. Return as scheduled for lab work, office visits, and treatments.   Thank you for choosing [place] at [facility] to provide your oncology and hematology care.  To afford each patient quality time with our provider, please arrive at least 15 minutes before your scheduled appointment time.   If you have a lab appointment with the [facility] please come in thru the Main Entrance and check in at the main information desk.  You need to re-schedule your appointment should you arrive 10 or more minutes late.  We strive to give you quality time with our providers, and arriving late affects you and other patients whose appointments are after yours.  Also, if you no show three or more times for appointments you may be dismissed from the clinic at the providers discretion.     Again, thank you for choosing [facility].  Our hope is that these requests will decrease the amount of time that you wait before being seen by our physicians.       _____________________________________________________________  Should you have questions after your visit to [facility], please contact our office at [phone] and follow the prompts.  Our office hours are 8:00 a.m. and 4:30 p.m. Monday - Friday.  Please note that voicemails left after 4:00 p.m. may not be returned until the following business day.  We are closed weekends and major holidays.  You do have access to a nurse 24-7, just call the main number to the clinic [phone] and do not press any options, hold on the line and a nurse will answer the phone.    For prescription refill requests, have your pharmacy contact our office and allow 72 hours.    Due to Covid, you will need to wear a mask upon entering the hospital. If you do not  have a mask, a mask will be given to you at the Main Entrance upon arrival. For doctor visits, patients may have 1 support person age 28 or older with them. For treatment visits, patients can not have anyone with them due to social distancing guidelines and our immunocompromised population.

## 2021-07-27 NOTE — Progress Notes (Signed)
Pt presents today for Keytruda per provider's order. Vital signs and labs WNL for treatment. Okay to proceed with treatment today per Dr.K  Beryle Flock given today per MD orders. Tolerated infusion without adverse affects. Vital signs stable. No complaints at this time. Discharged from clinic ambulatory in stable condition. Alert and oriented x 3. F/U with [facility] as scheduled.

## 2021-09-01 ENCOUNTER — Other Ambulatory Visit (HOSPITAL_COMMUNITY): Payer: Self-pay | Admitting: Hematology

## 2021-09-06 ENCOUNTER — Encounter (HOSPITAL_COMMUNITY): Payer: Self-pay | Admitting: *Deleted

## 2021-09-07 ENCOUNTER — Other Ambulatory Visit (HOSPITAL_COMMUNITY): Payer: Self-pay | Admitting: Hematology

## 2021-09-07 ENCOUNTER — Other Ambulatory Visit: Payer: Self-pay

## 2021-09-07 ENCOUNTER — Inpatient Hospital Stay (HOSPITAL_COMMUNITY): Payer: Medicare Other | Attending: Hematology

## 2021-09-07 ENCOUNTER — Inpatient Hospital Stay (HOSPITAL_COMMUNITY): Payer: Medicare Other

## 2021-09-07 VITALS — BP 141/80 | HR 73 | Temp 96.4°F | Resp 18 | Ht 69.0 in | Wt 153.0 lb

## 2021-09-07 DIAGNOSIS — C3491 Malignant neoplasm of unspecified part of right bronchus or lung: Secondary | ICD-10-CM

## 2021-09-07 DIAGNOSIS — C7931 Secondary malignant neoplasm of brain: Secondary | ICD-10-CM | POA: Diagnosis not present

## 2021-09-07 DIAGNOSIS — C3411 Malignant neoplasm of upper lobe, right bronchus or lung: Secondary | ICD-10-CM

## 2021-09-07 DIAGNOSIS — Z79899 Other long term (current) drug therapy: Secondary | ICD-10-CM | POA: Diagnosis not present

## 2021-09-07 DIAGNOSIS — Z5112 Encounter for antineoplastic immunotherapy: Secondary | ICD-10-CM | POA: Diagnosis present

## 2021-09-07 LAB — COMPREHENSIVE METABOLIC PANEL
ALT: 19 U/L (ref 0–44)
AST: 22 U/L (ref 15–41)
Albumin: 4.2 g/dL (ref 3.5–5.0)
Alkaline Phosphatase: 71 U/L (ref 38–126)
Anion gap: 8 (ref 5–15)
BUN: 12 mg/dL (ref 8–23)
CO2: 26 mmol/L (ref 22–32)
Calcium: 8.8 mg/dL — ABNORMAL LOW (ref 8.9–10.3)
Chloride: 97 mmol/L — ABNORMAL LOW (ref 98–111)
Creatinine, Ser: 0.8 mg/dL (ref 0.61–1.24)
GFR, Estimated: >60 mL/min (ref >60)
Glucose, Bld: 105 mg/dL — ABNORMAL HIGH (ref 70–99)
Potassium: 4.3 mmol/L (ref 3.5–5.1)
Sodium: 131 mmol/L — ABNORMAL LOW (ref 135–145)
Total Bilirubin: 0.5 mg/dL (ref 0.3–1.2)
Total Protein: 6.9 g/dL (ref 6.5–8.1)

## 2021-09-07 LAB — CBC WITH DIFFERENTIAL/PLATELET
Abs Immature Granulocytes: 0.02 10*3/uL (ref 0.00–0.07)
Basophils Absolute: 0.1 10*3/uL (ref 0.0–0.1)
Basophils Relative: 1 %
Eosinophils Absolute: 0.6 10*3/uL — ABNORMAL HIGH (ref 0.0–0.5)
Eosinophils Relative: 10 %
HCT: 38.7 % — ABNORMAL LOW (ref 39.0–52.0)
Hemoglobin: 13.3 g/dL (ref 13.0–17.0)
Immature Granulocytes: 0 %
Lymphocytes Relative: 24 %
Lymphs Abs: 1.5 10*3/uL (ref 0.7–4.0)
MCH: 32.7 pg (ref 26.0–34.0)
MCHC: 34.4 g/dL (ref 30.0–36.0)
MCV: 95.1 fL (ref 80.0–100.0)
Monocytes Absolute: 0.7 10*3/uL (ref 0.1–1.0)
Monocytes Relative: 11 %
Neutro Abs: 3.4 10*3/uL (ref 1.7–7.7)
Neutrophils Relative %: 54 %
Platelets: 255 10*3/uL (ref 150–400)
RBC: 4.07 MIL/uL — ABNORMAL LOW (ref 4.22–5.81)
RDW: 12.8 % (ref 11.5–15.5)
WBC: 6.3 10*3/uL (ref 4.0–10.5)
nRBC: 0 % (ref 0.0–0.2)

## 2021-09-07 LAB — MAGNESIUM: Magnesium: 2.1 mg/dL (ref 1.7–2.4)

## 2021-09-07 LAB — TSH: TSH: 1.43 u[IU]/mL (ref 0.350–4.500)

## 2021-09-07 MED ORDER — SODIUM CHLORIDE 0.9 % IV SOLN: Freq: Once | INTRAVENOUS | Status: AC

## 2021-09-07 MED ORDER — SODIUM CHLORIDE 0.9 % IV SOLN
400.0000 mg | Freq: Once | INTRAVENOUS | Status: AC
  Administered 2021-09-07: 400 mg via INTRAVENOUS
  Filled 2021-09-07: qty 16

## 2021-09-07 MED ORDER — HEPARIN SOD (PORK) LOCK FLUSH 100 UNIT/ML IV SOLN: 500.0000 [IU] | Freq: Once | INTRAVENOUS | Status: DC | PRN

## 2021-09-07 MED ORDER — SODIUM CHLORIDE 0.9% FLUSH: 10.0000 mL | INTRAVENOUS | Status: DC | PRN

## 2021-09-07 NOTE — Progress Notes (Signed)
Patient presents today for Keytruda infusion per providers order.  Vital signs and labs within parameters for treatment.  Peripheral IV started and blood return noted pre and post infusion.  Keytruda given today per MD orders.  Stable during infusion without adverse affects.  Vital signs stable.  No complaints at this time.  Discharge from clinic ambulatory in stable condition.  Alert and oriented X 3.  Patient refused AVS.  Follow up with [facility] as scheduled.

## 2021-09-07 NOTE — Patient Instructions (Signed)
[facility]  Discharge Instructions: Thank you for choosing [facility] to provide your oncology and hematology care.  If you have a lab appointment with the [facility], please come in thru the Main Entrance and check in at the main information desk.  Wear comfortable clothing and clothing appropriate for easy access to any Portacath or PICC line.   We strive to give you quality time with your provider. You may need to reschedule your appointment if you arrive late (15 or more minutes).  Arriving late affects you and other patients whose appointments are after yours.  Also, if you miss three or more appointments without notifying the office, you may be dismissed from the clinic at the provider's discretion.      For prescription refill requests, have your pharmacy contact our office and allow 72 hours for refills to be completed.    Today you received the following chemotherapy and/or immunotherapy agents Keytruda      To help prevent nausea and vomiting after your treatment, we encourage you to take your nausea medication as directed.  BELOW ARE SYMPTOMS THAT SHOULD BE REPORTED IMMEDIATELY: *FEVER GREATER THAN 100.4 F (38 C) OR HIGHER *CHILLS OR SWEATING *NAUSEA AND VOMITING THAT IS NOT CONTROLLED WITH YOUR NAUSEA MEDICATION *UNUSUAL SHORTNESS OF BREATH *UNUSUAL BRUISING OR BLEEDING *URINARY PROBLEMS (pain or burning when urinating, or frequent urination) *BOWEL PROBLEMS (unusual diarrhea, constipation, pain near the anus) TENDERNESS IN MOUTH AND THROAT WITH OR WITHOUT PRESENCE OF ULCERS (sore throat, sores in mouth, or a toothache) UNUSUAL RASH, SWELLING OR PAIN  UNUSUAL VAGINAL DISCHARGE OR ITCHING   Items with * indicate a potential emergency and should be followed up as soon as possible or go to the Emergency Department if any problems should occur.  Please show the CHEMOTHERAPY ALERT CARD or IMMUNOTHERAPY ALERT CARD at check-in to the Emergency  Department and triage nurse.  Should you have questions after your visit or need to cancel or reschedule your appointment, please contact [facility] [phone]  and follow the prompts.  Office hours are 8:00 a.m. to 4:30 p.m. Monday - Friday. Please note that voicemails left after 4:00 p.m. may not be returned until the following business day.  We are closed weekends and major holidays. You have access to a nurse at all times for urgent questions. Please call the main number to the clinic [phone] and follow the prompts.  For any non-urgent questions, you may also contact your provider using MyChart. We now offer e-Visits for anyone 52 and older to request care online for non-urgent symptoms. For details visit [email or web address].   Also download the MyChart app! Go to the app store, search "MyChart", open the app, select [place], and log in with your MyChart username and password.  Due to Covid, a mask is required upon entering the hospital/clinic. If you do not have a mask, one will be given to you upon arrival. For doctor visits, patients may have 1 support person aged 27 or older with them. For treatment visits, patients cannot have anyone with them due to current Covid guidelines and our immunocompromised population.

## 2021-09-08 ENCOUNTER — Encounter (HOSPITAL_COMMUNITY): Payer: Self-pay | Admitting: Hematology

## 2021-09-18 ENCOUNTER — Other Ambulatory Visit: Payer: Self-pay

## 2021-10-14 ENCOUNTER — Ambulatory Visit (HOSPITAL_COMMUNITY)
Admission: RE | Admit: 2021-10-14 | Discharge: 2021-10-14 | Disposition: A | Discharge status: Home / Self Care | Payer: Medicare Other | Source: Ambulatory Visit | Attending: Hematology | Admitting: Hematology

## 2021-10-14 ENCOUNTER — Other Ambulatory Visit: Payer: Self-pay

## 2021-10-14 DIAGNOSIS — C3491 Malignant neoplasm of unspecified part of right bronchus or lung: Secondary | ICD-10-CM | POA: Diagnosis present

## 2021-10-14 LAB — POCT I-STAT CREATININE: Creatinine, Ser: 0.8 mg/dL (ref 0.61–1.24)

## 2021-10-14 MED ORDER — IOHEXOL 300 MG/ML  SOLN
75.0000 mL | Freq: Once | INTRAMUSCULAR | Status: AC | PRN
  Administered 2021-10-14: 75 mL via INTRAVENOUS

## 2021-10-19 ENCOUNTER — Other Ambulatory Visit: Payer: Self-pay

## 2021-10-19 ENCOUNTER — Inpatient Hospital Stay (HOSPITAL_BASED_OUTPATIENT_CLINIC_OR_DEPARTMENT_OTHER): Payer: Medicare Other | Admitting: Hematology

## 2021-10-19 ENCOUNTER — Inpatient Hospital Stay (HOSPITAL_COMMUNITY): Payer: Medicare Other

## 2021-10-19 ENCOUNTER — Inpatient Hospital Stay (HOSPITAL_COMMUNITY): Payer: Medicare Other | Attending: Hematology

## 2021-10-19 VITALS — BP 127/78 | HR 81 | Temp 97.5°F | Resp 18 | Ht 69.0 in | Wt 149.4 lb

## 2021-10-19 VITALS — BP 123/71 | HR 79 | Temp 97.1°F | Resp 18

## 2021-10-19 DIAGNOSIS — Z5112 Encounter for antineoplastic immunotherapy: Secondary | ICD-10-CM | POA: Diagnosis present

## 2021-10-19 DIAGNOSIS — C3491 Malignant neoplasm of unspecified part of right bronchus or lung: Secondary | ICD-10-CM | POA: Insufficient documentation

## 2021-10-19 DIAGNOSIS — Z79899 Other long term (current) drug therapy: Secondary | ICD-10-CM | POA: Diagnosis not present

## 2021-10-19 DIAGNOSIS — M049 Autoinflammatory syndrome, unspecified: Secondary | ICD-10-CM | POA: Diagnosis not present

## 2021-10-19 DIAGNOSIS — C7931 Secondary malignant neoplasm of brain: Secondary | ICD-10-CM | POA: Diagnosis not present

## 2021-10-19 DIAGNOSIS — C3411 Malignant neoplasm of upper lobe, right bronchus or lung: Secondary | ICD-10-CM

## 2021-10-19 LAB — CBC WITH DIFFERENTIAL/PLATELET
Abs Immature Granulocytes: 0.01 10*3/uL (ref 0.00–0.07)
Basophils Absolute: 0.1 10*3/uL (ref 0.0–0.1)
Basophils Relative: 1 %
Eosinophils Absolute: 0.6 10*3/uL — ABNORMAL HIGH (ref 0.0–0.5)
Eosinophils Relative: 10 %
HCT: 41.3 % (ref 39.0–52.0)
Hemoglobin: 13.7 g/dL (ref 13.0–17.0)
Immature Granulocytes: 0 %
Lymphocytes Relative: 23 %
Lymphs Abs: 1.4 10*3/uL (ref 0.7–4.0)
MCH: 32.2 pg (ref 26.0–34.0)
MCHC: 33.2 g/dL (ref 30.0–36.0)
MCV: 96.9 fL (ref 80.0–100.0)
Monocytes Absolute: 0.5 10*3/uL (ref 0.1–1.0)
Monocytes Relative: 9 %
Neutro Abs: 3.4 10*3/uL (ref 1.7–7.7)
Neutrophils Relative %: 57 %
Platelets: 270 10*3/uL (ref 150–400)
RBC: 4.26 MIL/uL (ref 4.22–5.81)
RDW: 12.9 % (ref 11.5–15.5)
WBC: 5.9 10*3/uL (ref 4.0–10.5)
nRBC: 0 % (ref 0.0–0.2)

## 2021-10-19 LAB — COMPREHENSIVE METABOLIC PANEL
ALT: 19 U/L (ref 0–44)
AST: 21 U/L (ref 15–41)
Albumin: 4.4 g/dL (ref 3.5–5.0)
Alkaline Phosphatase: 72 U/L (ref 38–126)
Anion gap: 8 (ref 5–15)
BUN: 16 mg/dL (ref 8–23)
CO2: 26 mmol/L (ref 22–32)
Calcium: 8.9 mg/dL (ref 8.9–10.3)
Chloride: 98 mmol/L (ref 98–111)
Creatinine, Ser: 0.89 mg/dL (ref 0.61–1.24)
GFR, Estimated: >60 mL/min (ref >60)
Glucose, Bld: 137 mg/dL — ABNORMAL HIGH (ref 70–99)
Potassium: 4.6 mmol/L (ref 3.5–5.1)
Sodium: 132 mmol/L — ABNORMAL LOW (ref 135–145)
Total Bilirubin: 0.6 mg/dL (ref 0.3–1.2)
Total Protein: 7 g/dL (ref 6.5–8.1)

## 2021-10-19 LAB — TSH: TSH: 2.087 u[IU]/mL (ref 0.350–4.500)

## 2021-10-19 LAB — MAGNESIUM: Magnesium: 2.2 mg/dL (ref 1.7–2.4)

## 2021-10-19 MED ORDER — SODIUM CHLORIDE 0.9 % IV SOLN
400.0000 mg | Freq: Once | INTRAVENOUS | Status: AC
  Administered 2021-10-19: 400 mg via INTRAVENOUS
  Filled 2021-10-19: qty 16

## 2021-10-19 MED ORDER — SODIUM CHLORIDE 0.9 % IV SOLN: Freq: Once | INTRAVENOUS | Status: AC

## 2021-10-19 NOTE — Patient Instructions (Addendum)
[facility] at [facility] Discharge Instructions   You were seen and examined today by Dr. Delton Coombes.  He reviewed the results of your CT scan which shows the cancer is stable and hasn't grown or spread.  We will proceed with your treatment today.  Return as scheduled in 6 weeks - we will repeat an MRI of your brain prior to your next visit.    Thank you for choosing [facility] at [facility] to provide your oncology and hematology care.  To afford each patient quality time with our provider, please arrive at least 15 minutes before your scheduled appointment time.   If you have a lab appointment with the [facility] please come in thru the Main Entrance and check in at the main information desk.  You need to re-schedule your appointment should you arrive 10 or more minutes late.  We strive to give you quality time with our providers, and arriving late affects you and other patients whose appointments are after yours.  Also, if you no show three or more times for appointments you may be dismissed from the clinic at the providers discretion.     Again, thank you for choosing [facility].  Our hope is that these requests will decrease the amount of time that you wait before being seen by our physicians.       _____________________________________________________________  Should you have questions after your visit to [facility], please contact our office at [phone] and follow the prompts.  Our office hours are 8:00 a.m. and 4:30 p.m. Monday - Friday.  Please note that voicemails left after 4:00 p.m. may not be returned until the following business day.  We are closed weekends and major holidays.  You do have access to a nurse 24-7, just call the main number to the clinic [phone] and do not press any options, hold on the line and a nurse will answer the phone.    For prescription refill requests, have your  pharmacy contact our office and allow 72 hours.    Due to Covid, you will need to wear a mask upon entering the hospital. If you do not have a mask, a mask will be given to you at the Main Entrance upon arrival. For doctor visits, patients may have 1 support person age 41 or older with them. For treatment visits, patients can not have anyone with them due to social distancing guidelines and our immunocompromised population.

## 2021-10-19 NOTE — Progress Notes (Signed)
Pt presents today for Keytruda per provider's order. Vital signs and labs WNL for treatment today.Okay to proceed with treatment per Dr.K. Pt voiced no new complaints at this time.  Peripheral IV started with good blood return pre and post infusion.  Keytruda given today per MD orders. Tolerated infusion without adverse affects. Vital signs stable. No complaints at this time. Discharged from clinic ambulatory in stable condition. Alert and oriented x 3. F/U with [facility] as scheduled.

## 2021-10-19 NOTE — Progress Notes (Signed)
Patient has been examined by Dr. Katragadda, and vital signs and labs have been reviewed. ANC, Creatinine, LFTs, hemoglobin, and platelets are within treatment parameters per M.D. - pt may proceed with treatment.    °

## 2021-10-19 NOTE — Patient Instructions (Signed)
[facility]  Discharge Instructions: Thank you for choosing [facility] to provide your oncology and hematology care.  If you have a lab appointment with the [facility], please come in thru the Main Entrance and check in at the main information desk.  Wear comfortable clothing and clothing appropriate for easy access to any Portacath or PICC line.   We strive to give you quality time with your provider. You may need to reschedule your appointment if you arrive late (15 or more minutes).  Arriving late affects you and other patients whose appointments are after yours.  Also, if you miss three or more appointments without notifying the office, you may be dismissed from the clinic at the providers discretion.      For prescription refill requests, have your pharmacy contact our office and allow 72 hours for refills to be completed.    Today you received the following chemotherapy and/or immunotherapy agents Keytruda   To help prevent nausea and vomiting after your treatment, we encourage you to take your nausea medication as directed.  BELOW ARE SYMPTOMS THAT SHOULD BE REPORTED IMMEDIATELY: *FEVER GREATER THAN 100.4 F (38 C) OR HIGHER *CHILLS OR SWEATING *NAUSEA AND VOMITING THAT IS NOT CONTROLLED WITH YOUR NAUSEA MEDICATION *UNUSUAL SHORTNESS OF BREATH *UNUSUAL BRUISING OR BLEEDING *URINARY PROBLEMS (pain or burning when urinating, or frequent urination) *BOWEL PROBLEMS (unusual diarrhea, constipation, pain near the anus) TENDERNESS IN MOUTH AND THROAT WITH OR WITHOUT PRESENCE OF ULCERS (sore throat, sores in mouth, or a toothache) UNUSUAL RASH, SWELLING OR PAIN  UNUSUAL VAGINAL DISCHARGE OR ITCHING   Items with * indicate a potential emergency and should be followed up as soon as possible or go to the Emergency Department if any problems should occur.  Please show the CHEMOTHERAPY ALERT CARD or IMMUNOTHERAPY ALERT CARD at check-in to the Emergency Department  and triage nurse.  Should you have questions after your visit or need to cancel or reschedule your appointment, please contact [facility] [phone]  and follow the prompts.  Office hours are 8:00 a.m. to 4:30 p.m. Monday - Friday. Please note that voicemails left after 4:00 p.m. may not be returned until the following business day.  We are closed weekends and major holidays. You have access to a nurse at all times for urgent questions. Please call the main number to the clinic [phone] and follow the prompts.  For any non-urgent questions, you may also contact your provider using MyChart. We now offer e-Visits for anyone 38 and older to request care online for non-urgent symptoms. For details visit [email or web address].   Also download the MyChart app! Go to the app store, search "MyChart", open the app, select [facility], and log in with your MyChart username and password.  Due to Covid, a mask is required upon entering the hospital/clinic. If you do not have a mask, one will be given to you upon arrival. For doctor visits, patients may have 1 support person aged 101 or older with them. For treatment visits, patients cannot have anyone with them due to current Covid guidelines and our immunocompromised population.

## 2021-10-19 NOTE — Progress Notes (Signed)
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  PRIOR THERAPY: none  NGS Results: PD-L1 TPS >95%; EGFR, ALK, ROS1, RET, BRAF V600E negative  CURRENT THERAPY: Keytruda every 6 weeks  BRIEF ONCOLOGIC HISTORY:  Oncology History  Adenocarcinoma of lung, stage 4, right ([place])  12/08/2020 Initial Diagnosis   Adenocarcinoma of lung, stage 4, right ([place])   12/08/2020 Cancer Staging   Staging form: Lung, AJCC 8th Edition - Clinical stage from 12/08/2020: Stage IVB (cT2b, cN3, pM1c) - Signed by [name], MD on 12/08/2020 Histopathologic type: Adenocarcinoma, NOS    12/29/2020 -  Chemotherapy   Patient is on Treatment Plan : LUNG Pembrolizumab q42d       CANCER STAGING:  Cancer Staging  Adenocarcinoma of lung, stage 4, right [facility]) Staging form: Lung, AJCC 8th Edition - Clinical stage from 12/08/2020: Stage IVB (cT2b, cN3, pM1c) - Signed by [name], MD on 12/08/2020   INTERVAL HISTORY:  [name], a 66 y.o. male, returns for routine follow-up and consideration for next cycle of chemotherapy. Rashaud was last seen on 07/27/2021.  Due for cycle #8 of Keytruda today.   Overall, he tells me he has been feeling pretty well. He has lost 4 lbs since 07/19/21. He denies recent vaccination or injury to his left arm. He denies recent infections. He denies dry skin and itching.   Overall, he feels ready for next cycle of chemo today.   REVIEW OF SYSTEMS:  Review of Systems  Constitutional:  Negative for appetite change and fatigue.  Skin:  Negative for itching.  All other systems reviewed and are negative.  PAST MEDICAL/SURGICAL HISTORY:  Past Medical History:  Diagnosis Date   Cancer ([place])    lung cancer    Past Surgical History:  Procedure Laterality Date   BACK SURGERY      SOCIAL  HISTORY:  Social History   Socioeconomic History   Marital status: Single    Spouse name: Not on file   Number of children: Not on file   Years of education: Not on file   Highest education level: Not on file  Occupational History   Not on file  Tobacco Use   Smoking status: Former    Types: Cigarettes    Quit date: 12/01/2019    Years since quitting: 1.8   Smokeless tobacco: Never  Substance and Sexual Activity   Alcohol use: Never   Drug use: Never   Sexual activity: Not Currently  Other Topics Concern   Not on file  Social History Narrative   Not on file   Social Determinants of Health   Financial Resource Strain: Low Risk    Difficulty of Paying Living Expenses: Not hard at all  Food Insecurity: No Food Insecurity   Worried About Charity fundraiser in the Last Year: Never true   [place] in the Last Year: Never true  Transportation Needs: No Transportation Needs   Lack of Transportation (Medical): No   Lack of Transportation (Non-Medical): No  Physical Activity: Sufficiently Active   Days of Exercise per Week: 7 days   Minutes of Exercise per Session: 30 min  Stress: No Stress Concern Present   Feeling of Stress : Only a little  Social Connections: Moderately Integrated   Frequency of Communication with Friends and  Family: More than three times a week   Frequency of Social Gatherings with Friends and Family: More than three times a week   Attends Religious Services: 1 to 4 times per year   Active Member of [employer] or Organizations: No   Attends Music therapist: 1 to 4 times per year   Marital Status: Divorced  Human resources officer Violence: Not At Risk   Fear of Current or Ex-Partner: No   Emotionally Abused: No   Physically Abused: No   Sexually Abused: No    FAMILY HISTORY:  No family history on file.  CURRENT MEDICATIONS:  Current Outpatient Medications  Medication Sig Dispense Refill   BREO ELLIPTA 200-25 MCG/ACT AEPB INHALE 1 PUFF  INTO THE LUNGS EVERY MORNING 60 each 6   albuterol (VENTOLIN HFA) 108 (90 Base) MCG/ACT inhaler INHALE 2 PUFFS INTO THE LUNGS EVERY 4 HOURS AS NEEDED FOR WHEEZING OR SHORTNESS OF BREATH (Patient not taking: Reported on 10/19/2021) 6.7 g 6   No current facility-administered medications for this visit.    ALLERGIES:  No Known Allergies  PHYSICAL EXAM:  Performance status (ECOG): 1 - Symptomatic but completely ambulatory  Vitals:   10/19/21 1243  BP: 127/78  Pulse: 81  Resp: 18  Temp: (!) 97.5 F (36.4 C)  SpO2: 100%   Wt Readings from Last 3 Encounters:  10/19/21 149 lb 6.4 oz (67.8 kg)  09/07/21 153 lb (69.4 kg)  07/27/21 153 lb 1.6 oz (69.4 kg)   Physical Exam Vitals reviewed.  Constitutional:      Appearance: Normal appearance.  Cardiovascular:     Rate and Rhythm: Normal rate and regular rhythm.     Pulses: Normal pulses.     Heart sounds: Normal heart sounds.  Pulmonary:     Effort: Pulmonary effort is normal.     Breath sounds: Normal breath sounds.  Lymphadenopathy:     Upper Body:     Right upper body: No supraclavicular, axillary or pectoral adenopathy.     Left upper body: No supraclavicular, axillary or pectoral adenopathy.  Neurological:     General: No focal deficit present.     Mental Status: He is alert and oriented to person, place, and time.  Psychiatric:        Mood and Affect: Mood normal.        Behavior: Behavior normal.    LABORATORY DATA:  I have reviewed the labs as listed.  CBC Latest Ref Rng & Units 10/19/2021 09/07/2021 07/27/2021  WBC 4.0 - 10.5 K/uL 5.9 6.3 5.9  Hemoglobin 13.0 - 17.0 g/dL 13.7 13.3 13.8  Hematocrit 39.0 - 52.0 % 41.3 38.7(L) 41.2  Platelets 150 - 400 K/uL 270 255 249   CMP Latest Ref Rng & Units 10/19/2021 10/14/2021 09/07/2021  Glucose 70 - 99 mg/dL 137(H) - 105(H)  BUN 8 - 23 mg/dL 16 - 12  Creatinine 0.61 - 1.24 mg/dL 0.89 0.80 0.80  Sodium 135 - 145 mmol/L 132(L) - 131(L)  Potassium 3.5 - 5.1 mmol/L 4.6 - 4.3   Chloride 98 - 111 mmol/L 98 - 97(L)  CO2 22 - 32 mmol/L 26 - 26  Calcium 8.9 - 10.3 mg/dL 8.9 - 8.8(L)  Total Protein 6.5 - 8.1 g/dL 7.0 - 6.9  Total Bilirubin 0.3 - 1.2 mg/dL 0.6 - 0.5  Alkaline Phos 38 - 126 U/L 72 - 71  AST 15 - 41 U/L 21 - 22  ALT 0 - 44 U/L 19 - 19    DIAGNOSTIC  IMAGING:  I have independently reviewed the scans and discussed with the patient. CT Chest W Contrast  Result Date: 10/15/2021 CLINICAL DATA:  66 year old male with history of non-small cell lung cancer. Follow-up study. EXAM: CT CHEST WITH CONTRAST TECHNIQUE: Multidetector CT imaging of the chest was performed during intravenous contrast administration. RADIATION DOSE REDUCTION: This exam was performed according to the departmental dose-optimization program which includes automated exposure control, adjustment of the mA and/or kV according to patient size and/or use of iterative reconstruction technique. CONTRAST:  102m OMNIPAQUE IOHEXOL 300 MG/ML  SOLN COMPARISON:  Chest CT 07/18/2021. FINDINGS: Cardiovascular: Heart size is normal. There is no significant pericardial fluid, thickening or pericardial calcification. There is aortic atherosclerosis, as well as atherosclerosis of the great vessels of the mediastinum and the coronary arteries, including calcified atherosclerotic plaque in the left main, left anterior descending, left circumflex and right coronary arteries. Mediastinum/Nodes: No pathologically enlarged mediastinal or hilar lymph nodes. Esophagus is unremarkable in appearance. In the left axillary and subpectoral region there are multiple prominent borderline enlarged and mildly enlarged lymph nodes measuring up to 1.4 cm in short axis in the high left axilla (axial image 13 of series 2) and 1.3 cm in short axis in the subpectoral nodal station (axial image 25 of series 2), all of which appears slightly more prominent than the prior examination. No right axillary lymphadenopathy. Lungs/Pleura: Previously  noted right upper lobe pulmonary mass is grossly similar to the prior examination, currently measuring 3.3 x 3.6 cm (axial image 44 of series 4), previously 3.0 x 3.6 cm). Previously noted left upper lobe pulmonary nodule currently measures 9 x 6 mm (axial image 27 of series 4), previously 1.0 x 0.7 cm. Previously noted nodule in the superior segment of the left lower lobe currently measures 1.1 x 0.6 cm (axial image 66 of series 4), previously 1 cm). A few scattered smaller pulmonary nodules are noted and appear similar to prior study. No other larger more suspicious appearing pulmonary nodules or masses are identified. No acute consolidative airspace disease. No pleural effusions. Diffuse bronchial wall thickening with mild centrilobular and paraseptal emphysema. Upper Abdomen: Aortic atherosclerosis. Musculoskeletal: There are no aggressive appearing lytic or blastic lesions noted in the visualized portions of the skeleton. IMPRESSION: 1. Overall, today's examination demonstrates stable findings with dominant right upper lobe mass and multiple small pulmonary nodules, as detailed above. 2. Increasingly conspicuous left axillary and subpectoral lymphadenopathy. This would be an unusual distribution for metastatic disease from a lung primary. Further clinical evaluation to exclude a second primary malignancy is recommended. Tissue sampling should be considered if clinically appropriate. 3. Aortic atherosclerosis, in addition to left main and three-vessel coronary artery disease. Please note that although the presence of coronary artery calcium documents the presence of coronary artery disease, the severity of this disease and any potential stenosis cannot be assessed on this non-gated CT examination. Assessment for potential risk factor modification, dietary therapy or pharmacologic therapy may be warranted, if clinically indicated. Aortic Atherosclerosis (ICD10-I70.0). Electronically Signed   By: DVinnie Langton M.D.   On: 10/15/2021 11:56     ASSESSMENT:  1.  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 -Mutations were negative for EGFR, ALK, ROS1, RET, BRAF V600 -PD-L1 22 C3 greater than 95%. -He was started on single agent pembrolizumab every 3 weeks under the direction of Dr. RDola Factorin [place]card  [place] in August 2020, later switched to every 6 weeks. -We will consider testing for K-ras G 12 C, NTR K and met exon 14 mutations upon progression.   2.  Social/family history: -He is a retired Administrator.  Quit smoking in August 2020. -Father had prostate cancer.   PLAN:  1.  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 - We reviewed CT scan from 10/15/2021 which showed stable right upper lobe mass and multiple small lung nodules.  Left axillary and subpectoral lymph nodes are more conspicuous. - Clinically the lymph nodes are not palpable.  He denies any recent vaccination. - We will plan to closely follow them. - Reviewed labs today which showed normal LFTs and CBC.  TSH was 2.0. - Proceed with Keytruda today.  RTC 6 weeks for follow-up.   2.  Brain metastasis: - No prior brain radiation therapy. - MRI of the brain on 12/23/2020 showed no evidence of metastatic disease. - We will schedule him for brain scan again in 6 weeks.   3.  COPD: - Continue Breo Ellipta once daily and Ventolin as needed.   Orders placed this encounter:  No orders of the defined types were placed in this encounter.    [name], MD [place] [phone]   I, Thana Ates, am acting as a scribe for Dr. [name].  I, [name] MD, have reviewed the above documentation for accuracy and completeness, and I agree with the above.

## 2021-11-09 ENCOUNTER — Ambulatory Visit (HOSPITAL_COMMUNITY): Payer: Medicare Other

## 2021-11-09 ENCOUNTER — Inpatient Hospital Stay (HOSPITAL_COMMUNITY): Payer: Medicare Other

## 2021-11-09 ENCOUNTER — Encounter (HOSPITAL_COMMUNITY): Payer: Self-pay

## 2021-11-21 ENCOUNTER — Other Ambulatory Visit: Payer: Self-pay

## 2021-11-21 ENCOUNTER — Ambulatory Visit (HOSPITAL_COMMUNITY)
Admission: RE | Admit: 2021-11-21 | Discharge: 2021-11-21 | Disposition: A | Discharge status: Home / Self Care | Payer: Medicare Other | Source: Ambulatory Visit | Attending: Hematology | Admitting: Hematology

## 2021-11-21 DIAGNOSIS — C3491 Malignant neoplasm of unspecified part of right bronchus or lung: Secondary | ICD-10-CM | POA: Insufficient documentation

## 2021-11-21 IMAGING — MR MR HEAD WO/W CM
15 of 17 series · 35 of 48 positions shown · IV contrast (gadavist)
Comparison: [DATE]

CLINICAL DATA: Metastatic disease evaluation. Non-small cell lung
cancer

EXAM:
MRI HEAD WITHOUT AND WITH CONTRAST
TECHNIQUE: Multiplanar, multiecho pulse sequences of the brain and surrounding
structures were obtained without and with intravenous contrast.
CONTRAST:  7mL GADAVIST GADOBUTROL 1 MMOL/ML IV SOLN

[Series 5: DWI · axial · 3.0mm · 0.77mm/px · z∈[-60,+80]mm · 2 of 48 slices shown (1 of 6)]
[im 1/48]
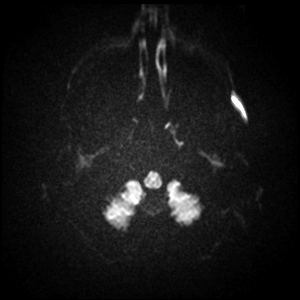
[im 48/48]
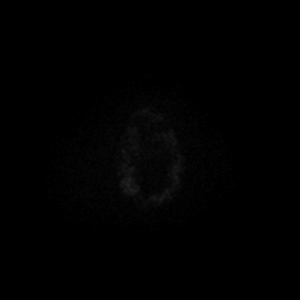

[Series 5: DWI · axial · 3.0mm · 0.77mm/px · z∈[-60,+80]mm · 3 of 48 slices shown (2 of 6)]
[im 1/48]
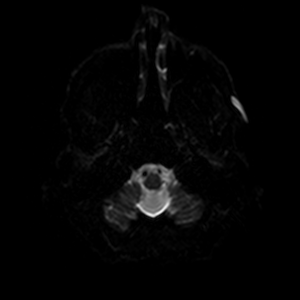
[im 24/48]
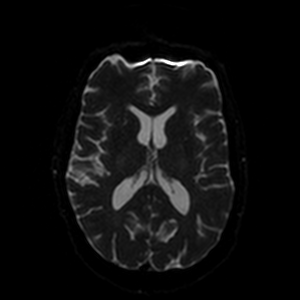
[im 48/48]
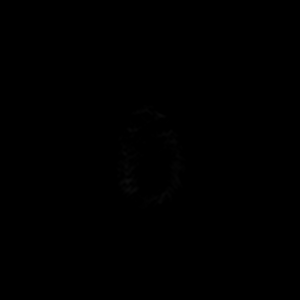

[Series 6: DWI · axial · 3.0mm · 0.77mm/px · z∈[-60,+80]mm · 3 of 47 slices shown (3 of 6)]
[im 1/47]
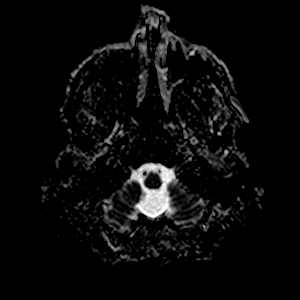
[im 24/47]
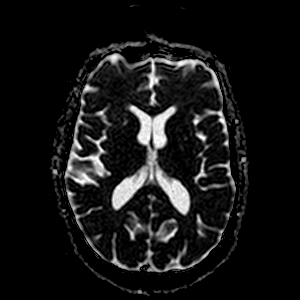
[im 47/47]
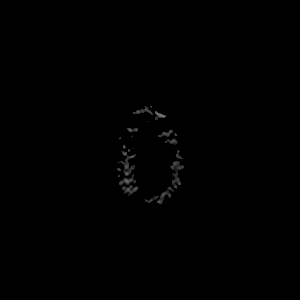

[Series 7: DWI · coronal · 5.0mm · 0.88mm/px · 2 of 30 slices shown (4 of 6)]
[im 1/30]
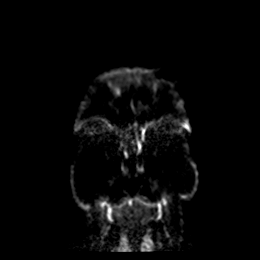
[im 30/30]
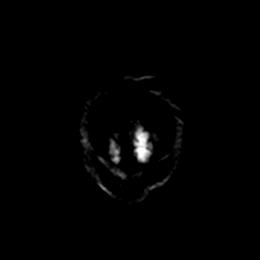

[Series 7: DWI · coronal · 5.0mm · 0.88mm/px · 2 of 30 slices shown (5 of 6)]
[im 1/30]
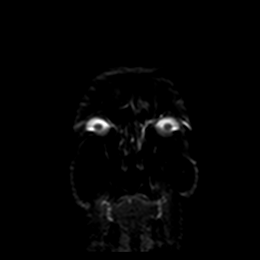
[im 30/30]
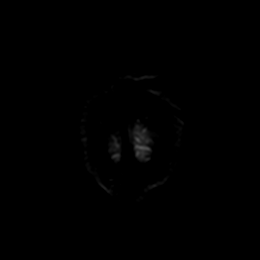

[Series 8: DWI · coronal · 5.0mm · 0.88mm/px · 2 of 30 slices shown (6 of 6)]
[im 1/30]
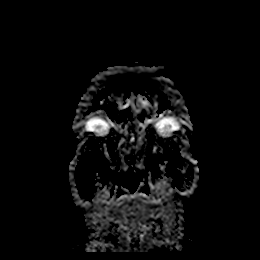
[im 30/30]
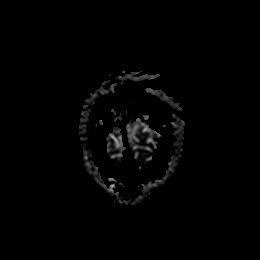

[Series 9: T1 · sagittal · 5.0mm · 0.75mm/px · 1 of 19 slices shown (1 of 2)]
[im 1/19]
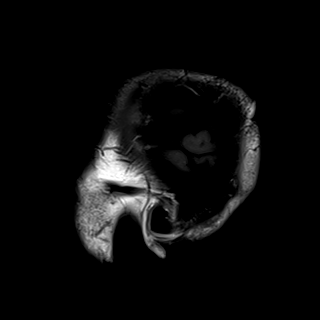

[Series 10: T2 · axial · 5.0mm · 0.72mm/px · 1 of 20 slices shown]
[im 1/20]
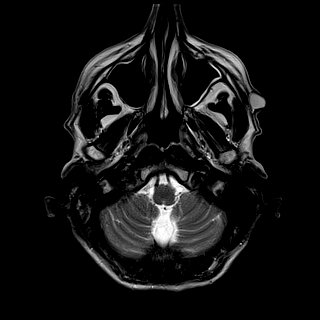

[Series 11: mag_images · axial · 3.0mm · 0.90mm/px · z∈[-76,+100]mm · 4 of 60 slices shown]
[im 1/60]
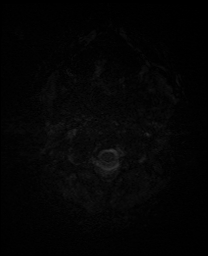
[im 20/60]
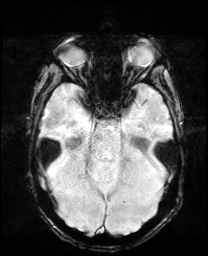
[im 40/60]
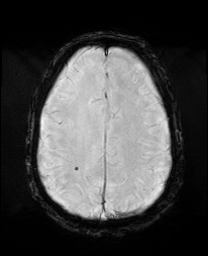
[im 60/60]
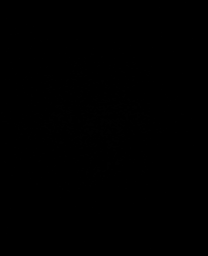

[Series 12: pha_images · axial · 3.0mm · 0.90mm/px · z∈[-76,+88]mm · 3 of 55 slices shown]
[im 1/55]
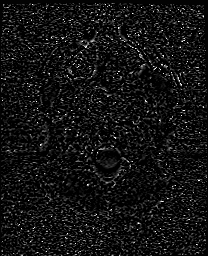
[im 28/55]
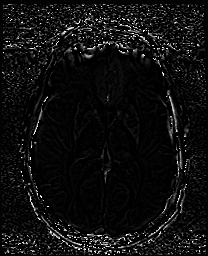
[im 55/55]
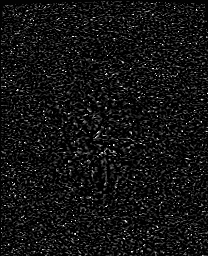

[Series 13: swi_images · axial · 3.0mm · 0.90mm/px · z∈[-76,+100]mm · 4 of 60 slices shown]
[im 1/60]
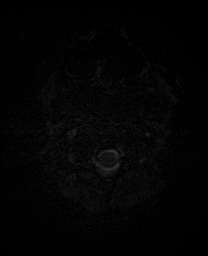
[im 20/60]
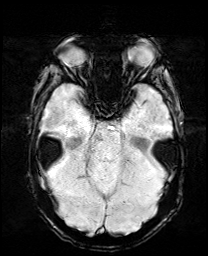
[im 40/60]
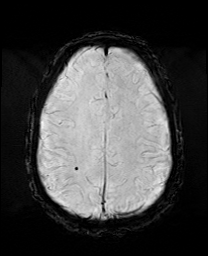
[im 60/60]
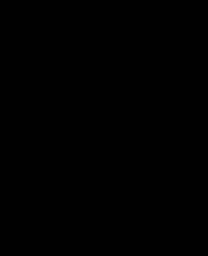

[Series 15: FLAIR · axial · 3.0mm · 0.45mm/px · z∈[-58,+82]mm · 3 of 48 slices shown]
[im 1/48]
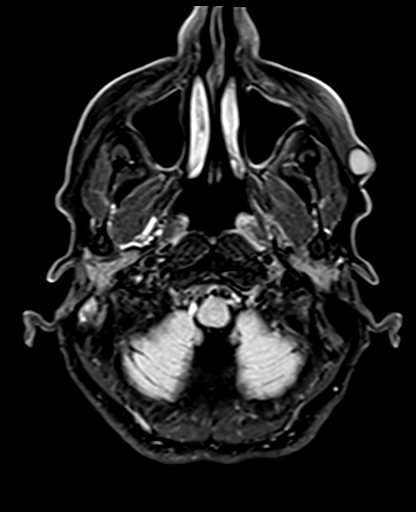
[im 24/48]
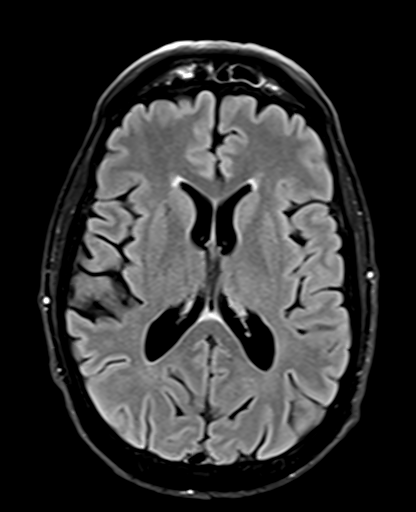
[im 48/48]
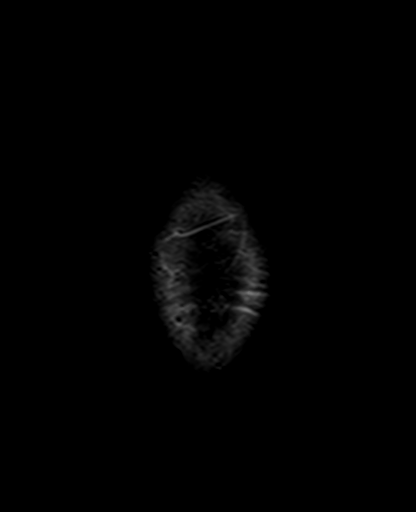

[Series 17: T2 post-contrast · coronal · 5.0mm · 0.72mm/px · 2 of 30 slices shown]
[im 1/30]
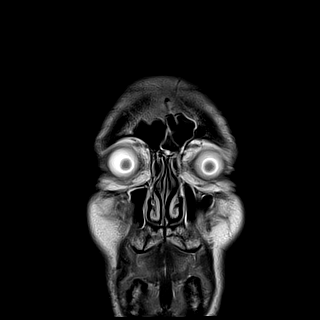
[im 30/30]
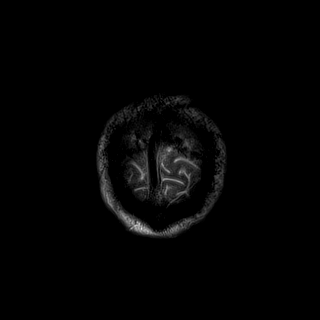

[Series 19: T1 post-contrast · coronal · 5.0mm · 0.34mm/px · 2 of 30 slices shown]
[im 1/30]
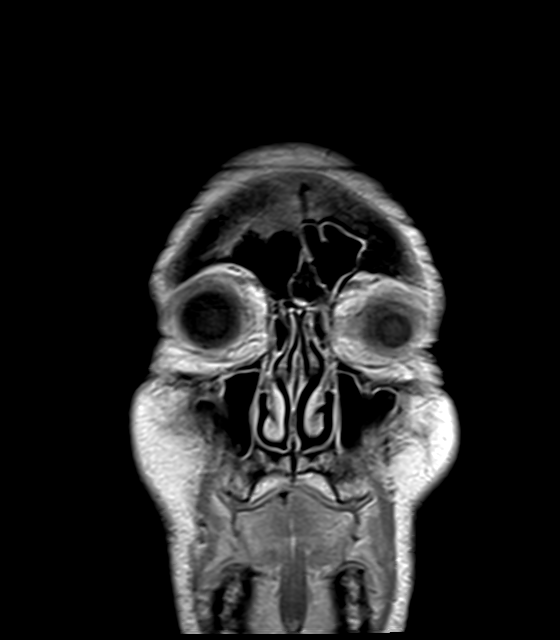
[im 30/30]
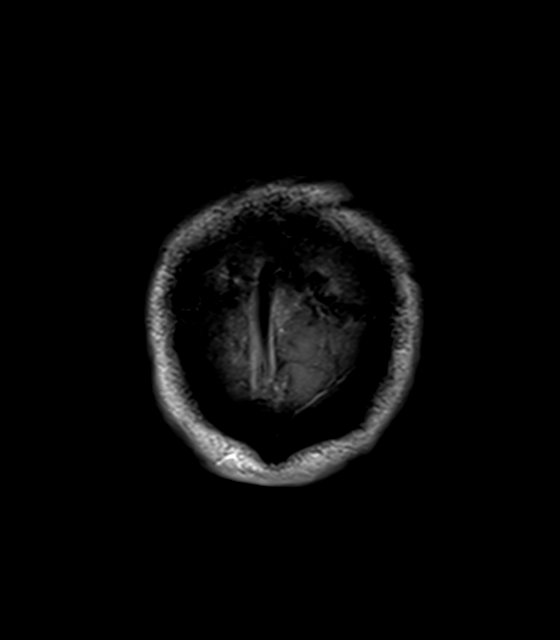

[Series 20: T1 · sagittal · 5.0mm · 0.94mm/px · 1 of 21 slices shown (2 of 2)]
[im 1/21]
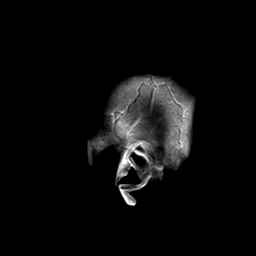

[35 of 48 positions shown; findings below may reference images not displayed]

FINDINGS: Brain: No restricted diffusion to suggest acute or subacute infarct.
No acute hemorrhage, mass, mass effect, or midline shift. No
hydrocephalus or extra-axial collection. No abnormal parenchymal or
meningeal enhancement.

Vascular: Normal flow voids.

Skull and upper cervical spine: Normal marrow signal.

Sinuses/Orbits: Negative.

Other: Cystic lesions in the left frontal and left pre malar soft
tissues most likely sebaceous cysts.
IMPRESSION: 1. No evidence of metastatic disease in the brain.
2. Redemonstrated sclerotic focus in the C3 vertebral body, which
appears unchanged and likely does not represent active metastatic
disease.

## 2021-11-21 MED ORDER — GADOBUTROL 1 MMOL/ML IV SOLN
7.0000 mL | Freq: Once | INTRAVENOUS | Status: AC | PRN
  Administered 2021-11-21: 7 mL via INTRAVENOUS

## 2021-11-30 ENCOUNTER — Inpatient Hospital Stay (HOSPITAL_BASED_OUTPATIENT_CLINIC_OR_DEPARTMENT_OTHER): Payer: Medicare Other | Admitting: Hematology

## 2021-11-30 ENCOUNTER — Inpatient Hospital Stay (HOSPITAL_COMMUNITY): Payer: Medicare Other | Attending: Hematology

## 2021-11-30 ENCOUNTER — Other Ambulatory Visit: Payer: Self-pay

## 2021-11-30 ENCOUNTER — Inpatient Hospital Stay (HOSPITAL_COMMUNITY): Payer: Medicare Other

## 2021-11-30 VITALS — BP 115/67 | HR 86 | Temp 97.6°F | Resp 18

## 2021-11-30 VITALS — BP 129/75 | HR 73 | Temp 96.6°F | Resp 18 | Ht 68.7 in | Wt 148.5 lb

## 2021-11-30 DIAGNOSIS — J449 Chronic obstructive pulmonary disease, unspecified: Secondary | ICD-10-CM | POA: Insufficient documentation

## 2021-11-30 DIAGNOSIS — C3411 Malignant neoplasm of upper lobe, right bronchus or lung: Secondary | ICD-10-CM

## 2021-11-30 DIAGNOSIS — Z79899 Other long term (current) drug therapy: Secondary | ICD-10-CM | POA: Insufficient documentation

## 2021-11-30 DIAGNOSIS — Z5112 Encounter for antineoplastic immunotherapy: Secondary | ICD-10-CM | POA: Diagnosis present

## 2021-11-30 DIAGNOSIS — C7931 Secondary malignant neoplasm of brain: Secondary | ICD-10-CM | POA: Insufficient documentation

## 2021-11-30 DIAGNOSIS — C3491 Malignant neoplasm of unspecified part of right bronchus or lung: Secondary | ICD-10-CM

## 2021-11-30 LAB — CBC WITH DIFFERENTIAL/PLATELET
Abs Immature Granulocytes: 0.02 10*3/uL (ref 0.00–0.07)
Basophils Absolute: 0.1 10*3/uL (ref 0.0–0.1)
Basophils Relative: 1 %
Eosinophils Absolute: 0.6 10*3/uL — ABNORMAL HIGH (ref 0.0–0.5)
Eosinophils Relative: 9 %
HCT: 39.8 % (ref 39.0–52.0)
Hemoglobin: 13.2 g/dL (ref 13.0–17.0)
Immature Granulocytes: 0 %
Lymphocytes Relative: 22 %
Lymphs Abs: 1.4 10*3/uL (ref 0.7–4.0)
MCH: 32.1 pg (ref 26.0–34.0)
MCHC: 33.2 g/dL (ref 30.0–36.0)
MCV: 96.8 fL (ref 80.0–100.0)
Monocytes Absolute: 0.7 10*3/uL (ref 0.1–1.0)
Monocytes Relative: 10 %
Neutro Abs: 3.7 10*3/uL (ref 1.7–7.7)
Neutrophils Relative %: 58 %
Platelets: 249 10*3/uL (ref 150–400)
RBC: 4.11 MIL/uL — ABNORMAL LOW (ref 4.22–5.81)
RDW: 12.4 % (ref 11.5–15.5)
WBC: 6.5 10*3/uL (ref 4.0–10.5)
nRBC: 0 % (ref 0.0–0.2)

## 2021-11-30 LAB — COMPREHENSIVE METABOLIC PANEL
ALT: 16 U/L (ref 0–44)
AST: 19 U/L (ref 15–41)
Albumin: 4.3 g/dL (ref 3.5–5.0)
Alkaline Phosphatase: 70 U/L (ref 38–126)
Anion gap: 10 (ref 5–15)
BUN: 21 mg/dL (ref 8–23)
CO2: 27 mmol/L (ref 22–32)
Calcium: 9.2 mg/dL (ref 8.9–10.3)
Chloride: 99 mmol/L (ref 98–111)
Creatinine, Ser: 1.02 mg/dL (ref 0.61–1.24)
GFR, Estimated: >60 mL/min (ref >60)
Glucose, Bld: 71 mg/dL (ref 70–99)
Potassium: 4.1 mmol/L (ref 3.5–5.1)
Sodium: 136 mmol/L (ref 135–145)
Total Bilirubin: 0.4 mg/dL (ref 0.3–1.2)
Total Protein: 7.2 g/dL (ref 6.5–8.1)

## 2021-11-30 LAB — MAGNESIUM: Magnesium: 2.1 mg/dL (ref 1.7–2.4)

## 2021-11-30 LAB — TSH: TSH: 1.365 u[IU]/mL (ref 0.350–4.500)

## 2021-11-30 MED ORDER — SODIUM CHLORIDE 0.9 % IV SOLN: Freq: Once | INTRAVENOUS | Status: AC

## 2021-11-30 MED ORDER — SODIUM CHLORIDE 0.9 % IV SOLN
400.0000 mg | Freq: Once | INTRAVENOUS | Status: AC
  Administered 2021-11-30: 400 mg via INTRAVENOUS
  Filled 2021-11-30: qty 16

## 2021-11-30 MED ORDER — FLUTICASONE FUROATE-VILANTEROL 200-25 MCG/ACT IN AEPB: INHALATION_SPRAY | RESPIRATORY_TRACT | 11 refills | Status: DC

## 2021-11-30 NOTE — Progress Notes (Signed)
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  PRIOR THERAPY: none  NGS Results: PD-L1 TPS >95%; EGFR, ALK, ROS1, RET, BRAF V600E negative  CURRENT THERAPY: Keytruda every 6 weeks  BRIEF ONCOLOGIC HISTORY:  Oncology History  Adenocarcinoma of lung, stage 4, right ([place])  12/08/2020 Initial Diagnosis   Adenocarcinoma of lung, stage 4, right ([place])   12/08/2020 Cancer Staging   Staging form: Lung, AJCC 8th Edition - Clinical stage from 12/08/2020: Stage IVB (cT2b, cN3, pM1c) - Signed by [name], MD on 12/08/2020 Histopathologic type: Adenocarcinoma, NOS    12/29/2020 -  Chemotherapy   Patient is on Treatment Plan : LUNG Pembrolizumab q42d       CANCER STAGING:  Cancer Staging  Adenocarcinoma of lung, stage 4, right [facility]) Staging form: Lung, AJCC 8th Edition - Clinical stage from 12/08/2020: Stage IVB (cT2b, cN3, pM1c) - Signed by [name], MD on 12/08/2020   INTERVAL HISTORY:  [name], a 66 y.o. male, returns for routine follow-up and consideration for next cycle of chemotherapy. Hughes was last seen on 10/19/2021.  Due for cycle #9 of Keytruda  today.   Overall, he tells me he has been feeling pretty well. He denies diarrhea, skin rash, cough, and infections.   Overall, he feels ready for next cycle of chemo today.    REVIEW OF SYSTEMS:  Review of Systems  Constitutional:  Negative for appetite change and fatigue.  Respiratory:  Negative for cough.   Gastrointestinal:  Negative for diarrhea.  Skin:  Negative for rash.  All other systems reviewed and are negative.  PAST MEDICAL/SURGICAL HISTORY:  Past Medical History:  Diagnosis Date   Cancer ([place])    lung cancer    Past Surgical History:  Procedure Laterality Date   BACK SURGERY      SOCIAL HISTORY:  Social  History   Socioeconomic History   Marital status: Single    Spouse name: Not on file   Number of children: Not on file   Years of education: Not on file   Highest education level: Not on file  Occupational History   Not on file  Tobacco Use   Smoking status: Former    Types: Cigarettes    Quit date: 12/01/2019    Years since quitting: 2.0   Smokeless tobacco: Never  Substance and Sexual Activity   Alcohol use: Never   Drug use: Never   Sexual activity: Not Currently  Other Topics Concern   Not on file  Social History Narrative   Not on file   Social Determinants of Health   Financial Resource Strain: Low Risk    Difficulty of Paying Living Expenses: Not hard at all  Food Insecurity: No Food Insecurity   Worried About Charity fundraiser in the Last Year: Never true   [place] in the Last Year: Never true  Transportation Needs: No Transportation Needs   Lack of Transportation (Medical): No   Lack of Transportation (Non-Medical): No  Physical Activity: Sufficiently Active   Days of Exercise per Week: 7 days   Minutes of Exercise per Session: 30 min  Stress: No Stress Concern Present   Feeling of Stress : Only a little  Social Connections: Moderately Integrated   Frequency of Communication with Friends and Family: More than three  times a week   Frequency of Social Gatherings with Friends and Family: More than three times a week   Attends Religious Services: 1 to 4 times per year   Active Member of [employer] or Organizations: No   Attends Music therapist: 1 to 4 times per year   Marital Status: Divorced  Human resources officer Violence: Not At Risk   Fear of Current or Ex-Partner: No   Emotionally Abused: No   Physically Abused: No   Sexually Abused: No    FAMILY HISTORY:  No family history on file.  CURRENT MEDICATIONS:  Current Outpatient Medications  Medication Sig Dispense Refill   albuterol (VENTOLIN HFA) 108 (90 Base) MCG/ACT inhaler INHALE 2  PUFFS INTO THE LUNGS EVERY 4 HOURS AS NEEDED FOR WHEEZING OR SHORTNESS OF BREATH (Patient not taking: Reported on 10/19/2021) 6.7 g 6   BREO ELLIPTA 200-25 MCG/ACT AEPB INHALE 1 PUFF INTO THE LUNGS EVERY MORNING 60 each 6   No current facility-administered medications for this visit.    ALLERGIES:  No Known Allergies  PHYSICAL EXAM:  Performance status (ECOG): 1 - Symptomatic but completely ambulatory  Vitals:   11/30/21 1300  BP: 129/75  Pulse: 73  Resp: 18  Temp: (!) 96.6 F (35.9 C)  SpO2: 100%   Wt Readings from Last 3 Encounters:  11/30/21 148 lb 8 oz (67.4 kg)  10/19/21 149 lb 6.4 oz (67.8 kg)  09/07/21 153 lb (69.4 kg)   Physical Exam Vitals reviewed.  Constitutional:      Appearance: Normal appearance.  Cardiovascular:     Rate and Rhythm: Normal rate and regular rhythm.     Pulses: Normal pulses.     Heart sounds: Normal heart sounds.  Pulmonary:     Effort: Pulmonary effort is normal.     Breath sounds: Normal breath sounds.  Neurological:     General: No focal deficit present.     Mental Status: He is alert and oriented to person, place, and time.  Psychiatric:        Mood and Affect: Mood normal.        Behavior: Behavior normal.    LABORATORY DATA:  I have reviewed the labs as listed.  CBC Latest Ref Rng & Units 11/30/2021 10/19/2021 09/07/2021  WBC 4.0 - 10.5 K/uL 6.5 5.9 6.3  Hemoglobin 13.0 - 17.0 g/dL 13.2 13.7 13.3  Hematocrit 39.0 - 52.0 % 39.8 41.3 38.7(L)  Platelets 150 - 400 K/uL 249 270 255   CMP Latest Ref Rng & Units 10/19/2021 10/14/2021 09/07/2021  Glucose 70 - 99 mg/dL 137(H) - 105(H)  BUN 8 - 23 mg/dL 16 - 12  Creatinine 0.61 - 1.24 mg/dL 0.89 0.80 0.80  Sodium 135 - 145 mmol/L 132(L) - 131(L)  Potassium 3.5 - 5.1 mmol/L 4.6 - 4.3  Chloride 98 - 111 mmol/L 98 - 97(L)  CO2 22 - 32 mmol/L 26 - 26  Calcium 8.9 - 10.3 mg/dL 8.9 - 8.8(L)  Total Protein 6.5 - 8.1 g/dL 7.0 - 6.9  Total Bilirubin 0.3 - 1.2 mg/dL 0.6 - 0.5  Alkaline Phos 38  - 126 U/L 72 - 71  AST 15 - 41 U/L 21 - 22  ALT 0 - 44 U/L 19 - 19    DIAGNOSTIC IMAGING:  I have independently reviewed the scans and discussed with the patient. MR Brain W Wo Contrast  Result Date: 11/22/2021 CLINICAL DATA:  Metastatic disease evaluation. Non-small cell lung cancer EXAM: MRI HEAD WITHOUT AND WITH  CONTRAST TECHNIQUE: Multiplanar, multiecho pulse sequences of the brain and surrounding structures were obtained without and with intravenous contrast. CONTRAST:  31m GADAVIST GADOBUTROL 1 MMOL/ML IV SOLN COMPARISON:  12/24/2020 FINDINGS: Brain: No restricted diffusion to suggest acute or subacute infarct. No acute hemorrhage, mass, mass effect, or midline shift. No hydrocephalus or extra-axial collection. No abnormal parenchymal or meningeal enhancement. Vascular: Normal flow voids. Skull and upper cervical spine: Normal marrow signal. Sinuses/Orbits: Negative. Other: Cystic lesions in the left frontal and left pre malar soft tissues most likely sebaceous cysts. IMPRESSION: 1. No evidence of metastatic disease in the brain. 2. Redemonstrated sclerotic focus in the C3 vertebral body, which appears unchanged and likely does not represent active metastatic disease. Electronically Signed   By: AMerilyn BabaM.D.   On: 11/22/2021 03:09     ASSESSMENT:  1.  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 -Mutations were negative for EGFR, ALK, ROS1, RET, BRAF V600 -PD-L1 22 C3 greater than 95%. -He was started on single agent pembrolizumab every 3 weeks under the direction of Dr. RDola Factorin [place] August 2020, later switched to every 6 weeks. -We will consider testing for K-ras G 12 C, NTR K and met exon 14 mutations upon progression.   2.  Social/family history: -He is a retired tAdministrator  Quit smoking in August 2020. -Father had prostate  cancer.   PLAN:  1.  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  Left axillary and subpectoral lymph nodes are more conspicuous. - Clinically lymph nodes are not palpable.  No history of recent vaccination. - Reviewed labs today which showed normal LFTs and CBC.  TSH was 1.3. - He will proceed with treatment today and in 6 weeks.  RTC 12 weeks for follow-up.  Plan to repeat CT of the chest with contrast prior to next visit.   2.  Brain metastasis: - No prior brain radiation therapy. - Reviewed MRI of the brain from 11/22/2021 which did not show any evidence of metastatic disease in the brain.  Redemonstrated sclerotic focus in the C3 vertebral body unchanged.   3.  COPD: - Continue Breo Ellipta once daily and Ventolin as needed.   Orders placed this encounter:  No orders of the defined types were placed in this encounter.    SDerek Jack MD [phone]  I, KThana Ates am acting as a scribe for Dr. SDerek Jack  I, SDerek JackMD, have reviewed the above documentation for accuracy and completeness, and I agree with the above.

## 2021-11-30 NOTE — Patient Instructions (Signed)
[facility]  Discharge Instructions: Thank you for choosing [facility] to provide your oncology and hematology care.  If you have a lab appointment with the Cancer Center, please come in thru the Main Entrance and check in at the main information desk.  Wear comfortable clothing and clothing appropriate for easy access to any Portacath or PICC line.   We strive to give you quality time with your provider. You may need to reschedule your appointment if you arrive late (15 or more minutes).  Arriving late affects you and other patients whose appointments are after yours.  Also, if you miss three or more appointments without notifying the office, you may be dismissed from the clinic at the provider's discretion.      For prescription refill requests, have your pharmacy contact our office and allow 72 hours for refills to be completed.        To help prevent nausea and vomiting after your treatment, we encourage you to take your nausea medication as directed.  BELOW ARE SYMPTOMS THAT SHOULD BE REPORTED IMMEDIATELY: *FEVER GREATER THAN 100.4 F (38 C) OR HIGHER *CHILLS OR SWEATING *NAUSEA AND VOMITING THAT IS NOT CONTROLLED WITH YOUR NAUSEA MEDICATION *UNUSUAL SHORTNESS OF BREATH *UNUSUAL BRUISING OR BLEEDING *URINARY PROBLEMS (pain or burning when urinating, or frequent urination) *BOWEL PROBLEMS (unusual diarrhea, constipation, pain near the anus) TENDERNESS IN MOUTH AND THROAT WITH OR WITHOUT PRESENCE OF ULCERS (sore throat, sores in mouth, or a toothache) UNUSUAL RASH, SWELLING OR PAIN  UNUSUAL VAGINAL DISCHARGE OR ITCHING   Items with * indicate a potential emergency and should be followed up as soon as possible or go to the Emergency Department if any problems should occur.  Please show the CHEMOTHERAPY ALERT CARD or IMMUNOTHERAPY ALERT CARD at check-in to the Emergency Department and triage nurse.  Should you have questions after your visit or need to cancel  or reschedule your appointment, please contact [facility] [phone]  and follow the prompts.  Office hours are 8:00 a.m. to 4:30 p.m. Monday - Friday. Please note that voicemails left after 4:00 p.m. may not be returned until the following business day.  We are closed weekends and major holidays. You have access to a nurse at all times for urgent questions. Please call the main number to the clinic [phone] and follow the prompts.  For any non-urgent questions, you may also contact your provider using MyChart. We now offer e-Visits for anyone 18 and older to request care online for non-urgent symptoms. For details visit [email or web address].   Also download the MyChart app! Go to the app store, search "MyChart", open the app, select [facility], and log in with your MyChart username and password.  Due to Covid, a mask is required upon entering the hospital/clinic. If you do not have a mask, one will be given to you upon arrival. For doctor visits, patients may have 1 support person aged 18 or older with them. For treatment visits, patients cannot have anyone with them due to current Covid guidelines and our immunocompromised population.  

## 2021-11-30 NOTE — Progress Notes (Signed)
Patient has been examined by Dr. Katragadda, and vital signs and labs have been reviewed. ANC, Creatinine, LFTs, hemoglobin, and platelets are within treatment parameters per M.D. - pt may proceed with treatment.    °

## 2021-11-30 NOTE — Patient Instructions (Signed)
[place] at [facility] ?Discharge Instructions ? ? ?You were seen and examined today by Dr. Delton Coombes. ? ?He reviewed the results of your lab work which is normal/stable.  ? ?We will proceed with your treatment today. ? ? ? ? ?Thank you for choosing [place] at [facility] to provide your oncology and hematology care.  To afford each patient quality time with our provider, please arrive at least 15 minutes before your scheduled appointment time.  ? ?If you have a lab appointment with the [facility] please come in thru the Main Entrance and check in at the main information desk. ? ?You need to re-schedule your appointment should you arrive 10 or more minutes late.  We strive to give you quality time with our providers, and arriving late affects you and other patients whose appointments are after yours.  Also, if you no show three or more times for appointments you may be dismissed from the clinic at the providers discretion.     ?Again, thank you for choosing [facility].  Our hope is that these requests will decrease the amount of time that you wait before being seen by our physicians.       ?_____________________________________________________________ ? ?Should you have questions after your visit to [facility], please contact our office at [phone] and follow the prompts.  Our office hours are 8:00 a.m. and 4:30 p.m. Monday - Friday.  Please note that voicemails left after 4:00 p.m. may not be returned until the following business day.  We are closed weekends and major holidays.  You do have access to a nurse 24-7, just call the main number to the clinic [phone] and do not press any options, hold on the line and a nurse will answer the phone.   ? ?For prescription refill requests, have your pharmacy contact our office and allow 72 hours.   ? ?Due to Covid, you will need to wear a mask upon entering the hospital. If you do  not have a mask, a mask will be given to you at the Main Entrance upon arrival. For doctor visits, patients may have 1 support person age 34 or older with them. For treatment visits, patients can not have anyone with them due to social distancing guidelines and our immunocompromised population.  ? ?   ?

## 2021-11-30 NOTE — Progress Notes (Signed)
Patient presents today for Keytruda infusion.   Patient is in satisfactory condition with no new complaints voiced.  Vital signs are stable.  Labs reviewed by Dr. Delton Coombes during his office visit. All labs are within treatment parameters.  We will proceed with treatment per MD orders.   ? ?Patient tolerated treatment well with no complaints voiced.  Patient left ambulatory in stable condition.  Vital signs stable at discharge.  Follow up as scheduled.    ?

## 2021-12-09 ENCOUNTER — Other Ambulatory Visit: Payer: Self-pay | Admitting: *Deleted

## 2021-12-09 DIAGNOSIS — C3491 Malignant neoplasm of unspecified part of right bronchus or lung: Secondary | ICD-10-CM

## 2021-12-09 DIAGNOSIS — Z79899 Other long term (current) drug therapy: Secondary | ICD-10-CM

## 2021-12-14 ENCOUNTER — Other Ambulatory Visit (HOSPITAL_COMMUNITY): Payer: Self-pay | Admitting: *Deleted

## 2021-12-14 DIAGNOSIS — Z79899 Other long term (current) drug therapy: Secondary | ICD-10-CM

## 2021-12-14 DIAGNOSIS — C3491 Malignant neoplasm of unspecified part of right bronchus or lung: Secondary | ICD-10-CM

## 2021-12-29 ENCOUNTER — Other Ambulatory Visit (HOSPITAL_COMMUNITY): Payer: Self-pay | Admitting: Hematology

## 2022-01-11 ENCOUNTER — Inpatient Hospital Stay (HOSPITAL_COMMUNITY): Payer: Medicare Other | Attending: Hematology

## 2022-01-11 ENCOUNTER — Ambulatory Visit (HOSPITAL_COMMUNITY): Payer: Medicare Other | Admitting: Hematology

## 2022-01-11 ENCOUNTER — Inpatient Hospital Stay (HOSPITAL_COMMUNITY): Payer: Medicare Other

## 2022-01-11 VITALS — BP 130/81 | HR 80 | Temp 97.6°F | Resp 18 | Ht 68.5 in | Wt 151.0 lb

## 2022-01-11 DIAGNOSIS — C3491 Malignant neoplasm of unspecified part of right bronchus or lung: Secondary | ICD-10-CM | POA: Insufficient documentation

## 2022-01-11 DIAGNOSIS — C7931 Secondary malignant neoplasm of brain: Secondary | ICD-10-CM | POA: Diagnosis not present

## 2022-01-11 DIAGNOSIS — Z79899 Other long term (current) drug therapy: Secondary | ICD-10-CM | POA: Insufficient documentation

## 2022-01-11 DIAGNOSIS — Z5112 Encounter for antineoplastic immunotherapy: Secondary | ICD-10-CM | POA: Insufficient documentation

## 2022-01-11 LAB — MAGNESIUM: Magnesium: 2.1 mg/dL (ref 1.7–2.4)

## 2022-01-11 LAB — TSH: TSH: 1.489 u[IU]/mL (ref 0.350–4.500)

## 2022-01-11 LAB — CBC WITH DIFFERENTIAL/PLATELET
Abs Immature Granulocytes: 0.02 10*3/uL (ref 0.00–0.07)
Basophils Absolute: 0.1 10*3/uL (ref 0.0–0.1)
Basophils Relative: 1 %
Eosinophils Absolute: 0.4 10*3/uL (ref 0.0–0.5)
Eosinophils Relative: 6 %
HCT: 39.7 % (ref 39.0–52.0)
Hemoglobin: 13.3 g/dL (ref 13.0–17.0)
Immature Granulocytes: 0 %
Lymphocytes Relative: 23 %
Lymphs Abs: 1.7 10*3/uL (ref 0.7–4.0)
MCH: 31.6 pg (ref 26.0–34.0)
MCHC: 33.5 g/dL (ref 30.0–36.0)
MCV: 94.3 fL (ref 80.0–100.0)
Monocytes Absolute: 0.7 10*3/uL (ref 0.1–1.0)
Monocytes Relative: 10 %
Neutro Abs: 4.6 10*3/uL (ref 1.7–7.7)
Neutrophils Relative %: 60 %
Platelets: 258 10*3/uL (ref 150–400)
RBC: 4.21 MIL/uL — ABNORMAL LOW (ref 4.22–5.81)
RDW: 12.3 % (ref 11.5–15.5)
WBC: 7.6 10*3/uL (ref 4.0–10.5)
nRBC: 0 % (ref 0.0–0.2)

## 2022-01-11 LAB — COMPREHENSIVE METABOLIC PANEL
ALT: 17 U/L (ref 0–44)
AST: 21 U/L (ref 15–41)
Albumin: 4.4 g/dL (ref 3.5–5.0)
Alkaline Phosphatase: 74 U/L (ref 38–126)
Anion gap: 7 (ref 5–15)
BUN: 17 mg/dL (ref 8–23)
CO2: 25 mmol/L (ref 22–32)
Calcium: 9.1 mg/dL (ref 8.9–10.3)
Chloride: 96 mmol/L — ABNORMAL LOW (ref 98–111)
Creatinine, Ser: 0.82 mg/dL (ref 0.61–1.24)
GFR, Estimated: >60 mL/min (ref >60)
Glucose, Bld: 104 mg/dL — ABNORMAL HIGH (ref 70–99)
Potassium: 4.5 mmol/L (ref 3.5–5.1)
Sodium: 128 mmol/L — ABNORMAL LOW (ref 135–145)
Total Bilirubin: 0.6 mg/dL (ref 0.3–1.2)
Total Protein: 7.3 g/dL (ref 6.5–8.1)

## 2022-01-11 MED ORDER — SODIUM CHLORIDE 0.9 % IV SOLN
400.0000 mg | Freq: Once | INTRAVENOUS | Status: AC
  Administered 2022-01-11: 400 mg via INTRAVENOUS
  Filled 2022-01-11: qty 16

## 2022-01-11 MED ORDER — SODIUM CHLORIDE 0.9 % IV SOLN: Freq: Once | INTRAVENOUS | Status: AC

## 2022-01-11 NOTE — Progress Notes (Signed)
Patient presents today for Keytruda infusion.  Patient is in satisfactory condition with no complaints voiced.  Vital signs are stable.  Labs reviewed and all labs are within treatment parameters.  IV placed in left AC with good blood return noted.  We will proceed with treatment per MD orders.  ? ?Patient tolerated treatment well with no complaints voiced.  Patient left ambulatory in stable condition.  Vital signs stable at discharge.  Follow up as scheduled.    ?

## 2022-01-11 NOTE — Patient Instructions (Signed)
[facility]  Discharge Instructions: Thank you for choosing [facility] to provide your oncology and hematology care.  If you have a lab appointment with the Cancer Center, please come in thru the Main Entrance and check in at the main information desk.  Wear comfortable clothing and clothing appropriate for easy access to any Portacath or PICC line.   We strive to give you quality time with your provider. You may need to reschedule your appointment if you arrive late (15 or more minutes).  Arriving late affects you and other patients whose appointments are after yours.  Also, if you miss three or more appointments without notifying the office, you may be dismissed from the clinic at the provider's discretion.      For prescription refill requests, have your pharmacy contact our office and allow 72 hours for refills to be completed.        To help prevent nausea and vomiting after your treatment, we encourage you to take your nausea medication as directed.  BELOW ARE SYMPTOMS THAT SHOULD BE REPORTED IMMEDIATELY: *FEVER GREATER THAN 100.4 F (38 C) OR HIGHER *CHILLS OR SWEATING *NAUSEA AND VOMITING THAT IS NOT CONTROLLED WITH YOUR NAUSEA MEDICATION *UNUSUAL SHORTNESS OF BREATH *UNUSUAL BRUISING OR BLEEDING *URINARY PROBLEMS (pain or burning when urinating, or frequent urination) *BOWEL PROBLEMS (unusual diarrhea, constipation, pain near the anus) TENDERNESS IN MOUTH AND THROAT WITH OR WITHOUT PRESENCE OF ULCERS (sore throat, sores in mouth, or a toothache) UNUSUAL RASH, SWELLING OR PAIN  UNUSUAL VAGINAL DISCHARGE OR ITCHING   Items with * indicate a potential emergency and should be followed up as soon as possible or go to the Emergency Department if any problems should occur.  Please show the CHEMOTHERAPY ALERT CARD or IMMUNOTHERAPY ALERT CARD at check-in to the Emergency Department and triage nurse.  Should you have questions after your visit or need to cancel  or reschedule your appointment, please contact [facility] [phone]  and follow the prompts.  Office hours are 8:00 a.m. to 4:30 p.m. Monday - Friday. Please note that voicemails left after 4:00 p.m. may not be returned until the following business day.  We are closed weekends and major holidays. You have access to a nurse at all times for urgent questions. Please call the main number to the clinic [phone] and follow the prompts.  For any non-urgent questions, you may also contact your provider using MyChart. We now offer e-Visits for anyone 18 and older to request care online for non-urgent symptoms. For details visit [email or web address].   Also download the MyChart app! Go to the app store, search "MyChart", open the app, select [facility], and log in with your MyChart username and password.  Due to Covid, a mask is required upon entering the hospital/clinic. If you do not have a mask, one will be given to you upon arrival. For doctor visits, patients may have 1 support person aged 18 or older with them. For treatment visits, patients cannot have anyone with them due to current Covid guidelines and our immunocompromised population.  

## 2022-01-29 ENCOUNTER — Other Ambulatory Visit (HOSPITAL_COMMUNITY): Payer: Self-pay | Admitting: Hematology

## 2022-01-30 ENCOUNTER — Encounter (HOSPITAL_COMMUNITY): Payer: Self-pay | Admitting: Hematology

## 2022-02-15 ENCOUNTER — Ambulatory Visit (HOSPITAL_COMMUNITY)
Admission: RE | Admit: 2022-02-15 | Discharge: 2022-02-15 | Disposition: A | Discharge status: Home / Self Care | Payer: Medicare Other | Source: Ambulatory Visit | Attending: Hematology | Admitting: Hematology

## 2022-02-15 DIAGNOSIS — C3491 Malignant neoplasm of unspecified part of right bronchus or lung: Secondary | ICD-10-CM | POA: Insufficient documentation

## 2022-02-15 IMAGING — CT CT CHEST W/ CM
2 of 5 series · 15 of 36 positions shown, 18 images · IV contrast (Omnipaque or Isovue)
Comparison: Multiple priors including most recent CT [DATE]

CLINICAL DATA: Follow-up for metastatic right lung adenocarcinoma.
Currently receiving immunotherapy.

* Tracking Code: BO *
EXAM:
CT CHEST WITH CONTRAST
TECHNIQUE: Multidetector CT imaging of the chest was performed during
intravenous contrast administration.

[Series 3: thins · axial · 0.62mm/px · z∈[+1200,+1489]mm · 12 of 510 slices shown, 15 images]
[im 32/510  mediastinal]
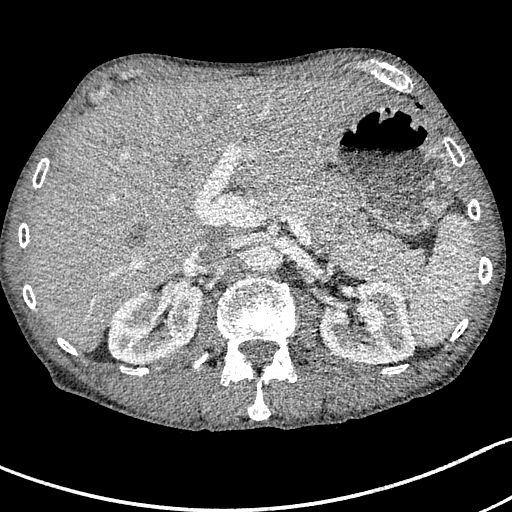
[im 32/510  lung]
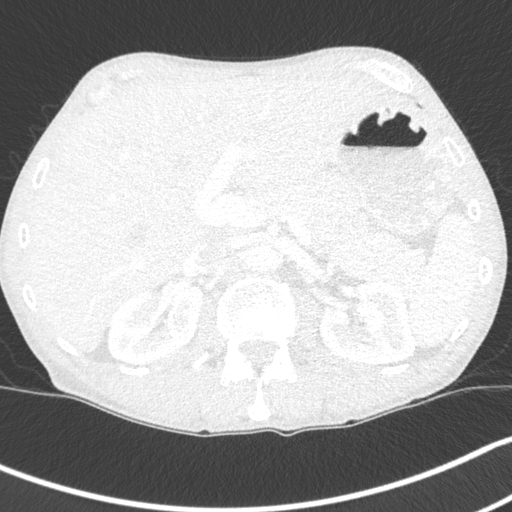
[im 64/510  lung]
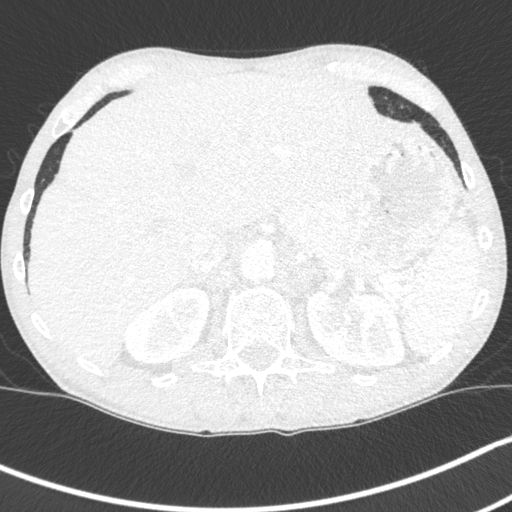
[im 128/510  lung]
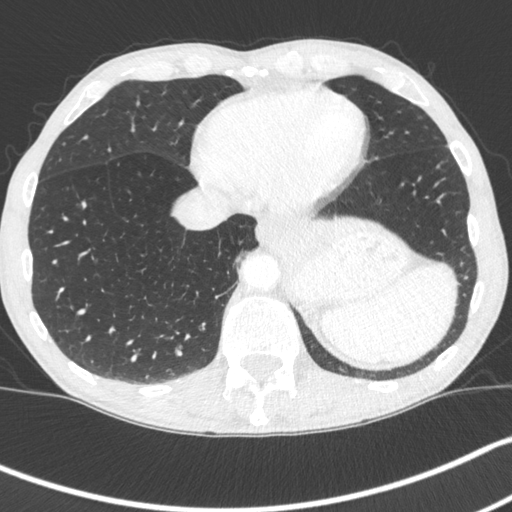
[im 160/510  lung]
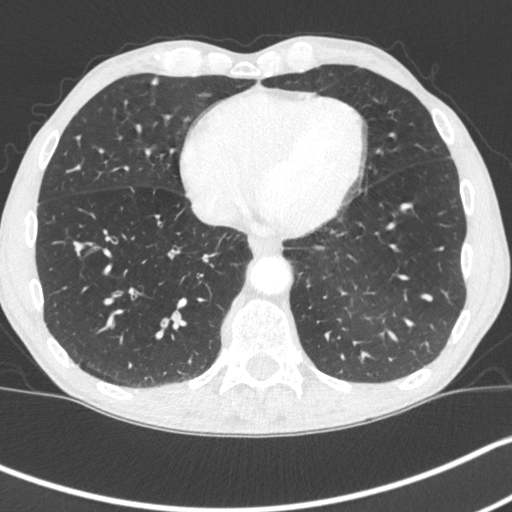
[im 191/510  mediastinal]
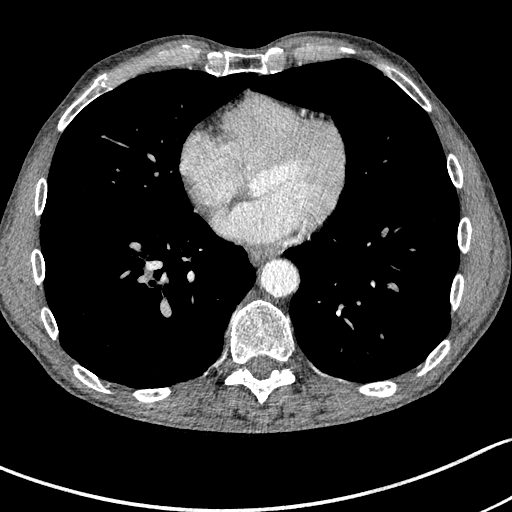
[im 191/510  lung]
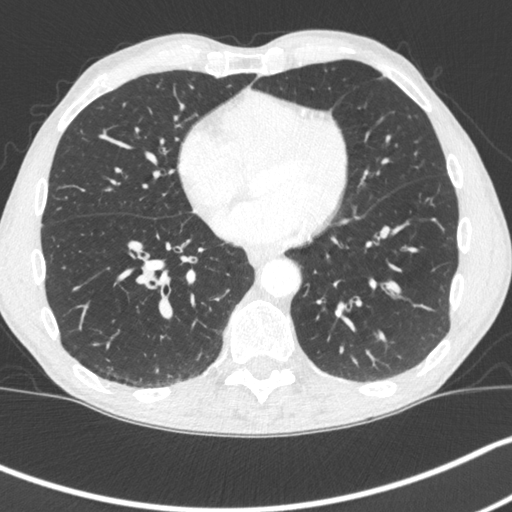
[im 223/510  lung]
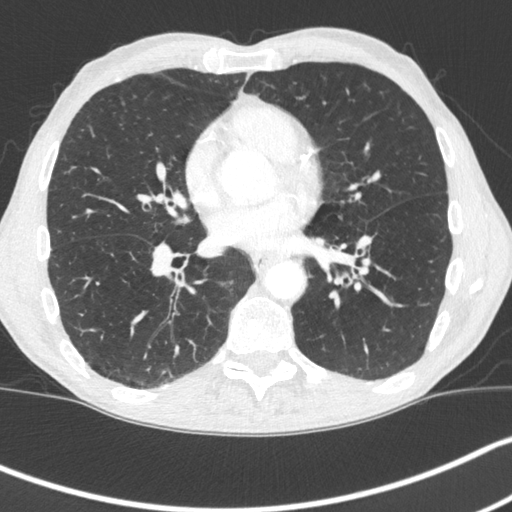
[im 287/510  lung]
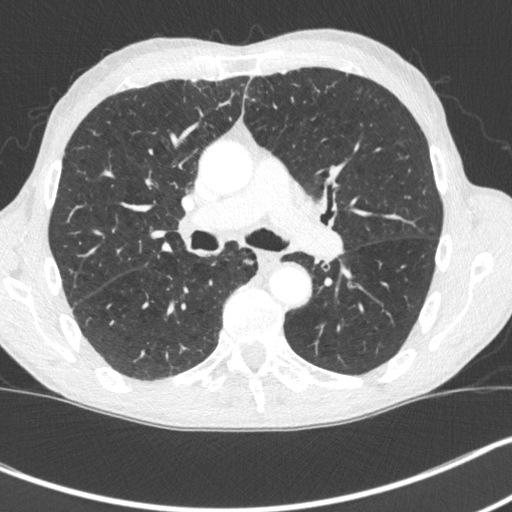
[im 319/510  lung]
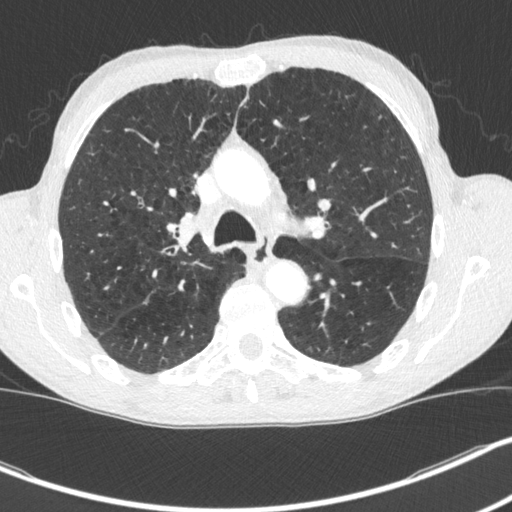
[im 350/510  mediastinal]
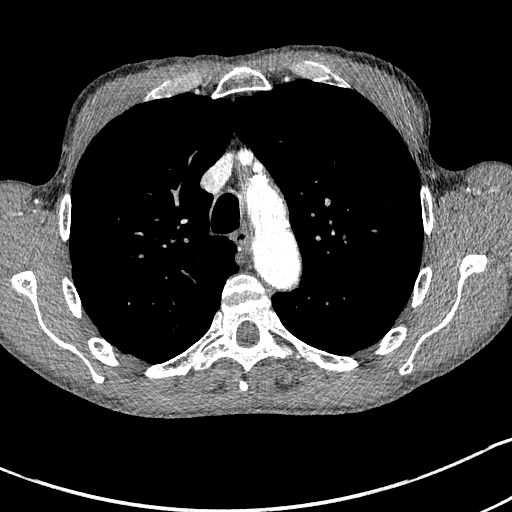
[im 350/510  lung]
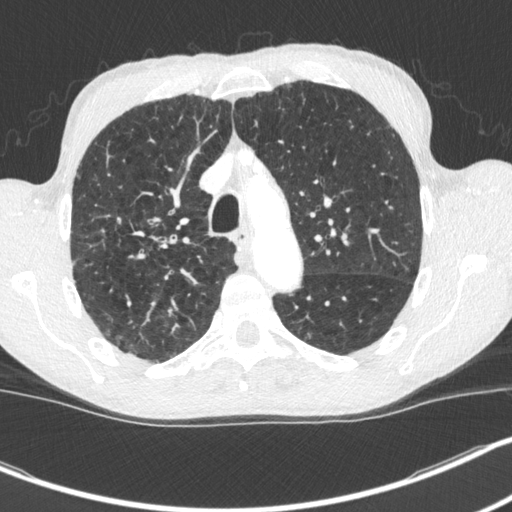
[im 382/510  lung]
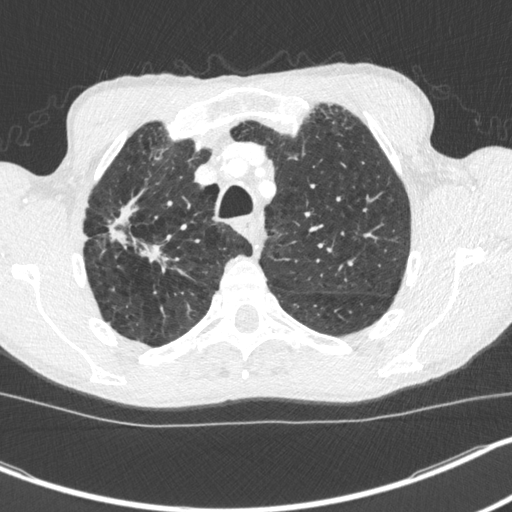
[im 446/510  lung]
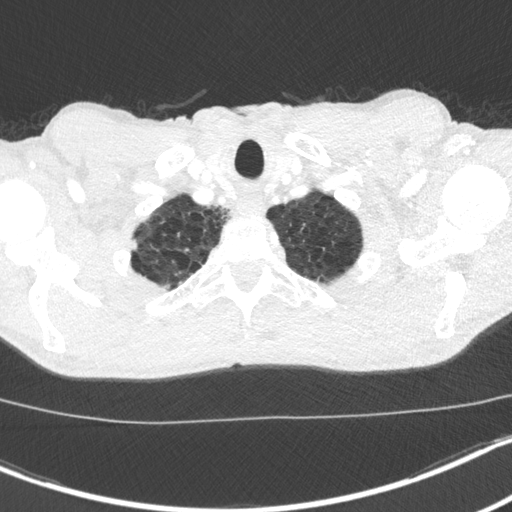
[im 478/510  lung]
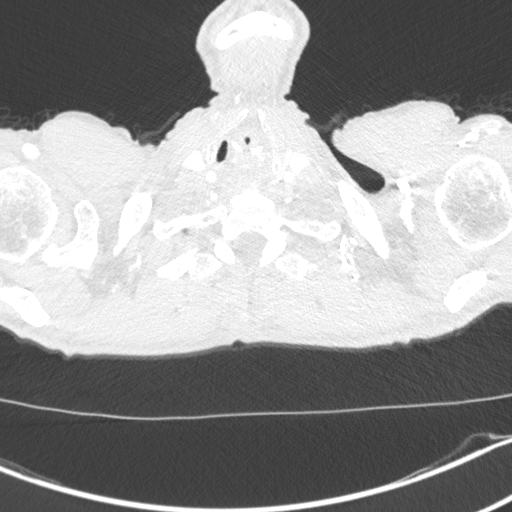

[Series 5: coronal · coronal · 0.70mm/px · 3 of 134 slices shown]
[im 27/134  lung]
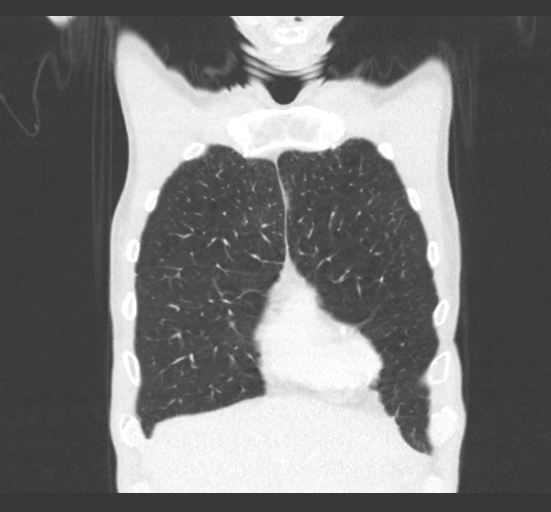
[im 54/134  lung]
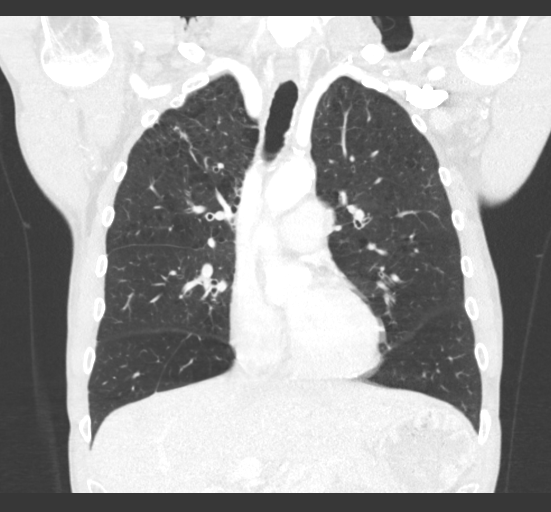
[im 80/134  lung]
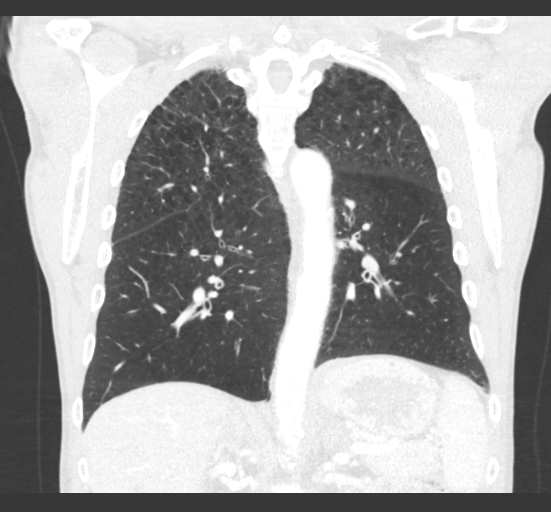

[15 of 36 positions shown; findings below may reference images not displayed]

RADIATION DOSE REDUCTION: This exam was performed according to the
departmental dose-optimization program which includes automated
exposure control, adjustment of the mA and/or kV according to
patient size and/or use of iterative reconstruction technique.

CONTRAST:  80mL OMNIPAQUE IOHEXOL 300 MG/ML  SOLN
FINDINGS: Cardiovascular: Aortic and branch vessel atherosclerosis without
thoracic aortic aneurysm. No central pulmonary embolus on this
nondedicated study. Three-vessel coronary artery calcifications.
Normal size heart. No significant pericardial effusion/thickening.

Mediastinum/Nodes: No suspicious thyroid nodule. No pathologically
enlarged mediastinal or hilar lymph nodes. No right axillary or
right subpectoral adenopathy.

Enlarged left axillary and subpectoral lymph nodes are to similar
prior. Index lymph nodes are as follows. High left axillary lymph

unchanged.

Lungs/Pleura: Spiculated right upper lobe pulmonary mass measures
3.2 x 2.0 cm on image 46/2 previously 3.1 x 2.2 on [DATE]
and 3.1 x 2.2 cm on [DATE] when remeasured for
consistency.

Left upper lobe pulmonary nodule measures 6 mm x 6 mm on image [DATE]
previously 9 mm x 6 mm.

Nodule in the superior segment of the left lower lobe measures 1.2 x
1.0 cm on image 70/4 previously 1.1 x 0.6 cm.

A few additional scattered smaller pulmonary nodules are again seen
and appears similar prior study. No new suspicious pulmonary nodules
or masses identified.

No pleural effusion.  No pneumothorax.

Diffuse bronchial wall thickening with mild centrilobular and
paraseptal emphysema.

Upper Abdomen: No acute abnormality.

Musculoskeletal: No aggressive lytic or blastic lesion of bone.
Multilevel degenerative changes spine.
IMPRESSION: 1. Slight interval increase in size of a solid superior segment of
the left lower lobe pulmonary nodule, nonspecific, suggest attention
on short interval follow-up chest CT.
2. No significant interval change in size of the spiculated right
upper lobe pulmonary mass or other scattered pulmonary nodules.
3. Similar left axillary and subpectoral lymphadenopathy,
nonspecific but once again would consider further evaluation with
targeted ultrasound and aspiration/biopsy.
4. Aortic Atherosclerosis ([SH]-[SH]) and Emphysema ([SH]-[SH]).

## 2022-02-15 MED ORDER — IOHEXOL 300 MG/ML  SOLN
80.0000 mL | Freq: Once | INTRAMUSCULAR | Status: AC | PRN
  Administered 2022-02-15: 80 mL via INTRAVENOUS

## 2022-02-22 ENCOUNTER — Inpatient Hospital Stay (HOSPITAL_BASED_OUTPATIENT_CLINIC_OR_DEPARTMENT_OTHER): Payer: Medicare Other | Admitting: Hematology

## 2022-02-22 ENCOUNTER — Inpatient Hospital Stay (HOSPITAL_COMMUNITY): Payer: Medicare Other

## 2022-02-22 ENCOUNTER — Inpatient Hospital Stay (HOSPITAL_COMMUNITY): Payer: Medicare Other | Attending: Hematology

## 2022-02-22 VITALS — BP 152/80 | HR 65 | Temp 97.5°F | Resp 16 | Ht 68.5 in | Wt 149.5 lb

## 2022-02-22 VITALS — BP 134/78 | HR 77 | Temp 97.9°F | Resp 18

## 2022-02-22 DIAGNOSIS — C3412 Malignant neoplasm of upper lobe, left bronchus or lung: Secondary | ICD-10-CM | POA: Diagnosis not present

## 2022-02-22 DIAGNOSIS — Z7951 Long term (current) use of inhaled steroids: Secondary | ICD-10-CM | POA: Diagnosis not present

## 2022-02-22 DIAGNOSIS — C3411 Malignant neoplasm of upper lobe, right bronchus or lung: Secondary | ICD-10-CM | POA: Diagnosis not present

## 2022-02-22 DIAGNOSIS — D649 Anemia, unspecified: Secondary | ICD-10-CM | POA: Insufficient documentation

## 2022-02-22 DIAGNOSIS — Z79899 Other long term (current) drug therapy: Secondary | ICD-10-CM

## 2022-02-22 DIAGNOSIS — C3432 Malignant neoplasm of lower lobe, left bronchus or lung: Secondary | ICD-10-CM | POA: Insufficient documentation

## 2022-02-22 DIAGNOSIS — Z5112 Encounter for antineoplastic immunotherapy: Secondary | ICD-10-CM | POA: Diagnosis present

## 2022-02-22 DIAGNOSIS — C7931 Secondary malignant neoplasm of brain: Secondary | ICD-10-CM | POA: Diagnosis not present

## 2022-02-22 DIAGNOSIS — Z8042 Family history of malignant neoplasm of prostate: Secondary | ICD-10-CM | POA: Diagnosis not present

## 2022-02-22 DIAGNOSIS — C3491 Malignant neoplasm of unspecified part of right bronchus or lung: Secondary | ICD-10-CM

## 2022-02-22 DIAGNOSIS — Z87891 Personal history of nicotine dependence: Secondary | ICD-10-CM | POA: Diagnosis not present

## 2022-02-22 DIAGNOSIS — J449 Chronic obstructive pulmonary disease, unspecified: Secondary | ICD-10-CM | POA: Insufficient documentation

## 2022-02-22 LAB — COMPREHENSIVE METABOLIC PANEL
ALT: 17 U/L (ref 0–44)
AST: 22 U/L (ref 15–41)
Albumin: 4 g/dL (ref 3.5–5.0)
Alkaline Phosphatase: 70 U/L (ref 38–126)
Anion gap: 5 (ref 5–15)
BUN: 10 mg/dL (ref 8–23)
CO2: 26 mmol/L (ref 22–32)
Calcium: 8.7 mg/dL — ABNORMAL LOW (ref 8.9–10.3)
Chloride: 101 mmol/L (ref 98–111)
Creatinine, Ser: 0.76 mg/dL (ref 0.61–1.24)
GFR, Estimated: >60 mL/min (ref >60)
Glucose, Bld: 110 mg/dL — ABNORMAL HIGH (ref 70–99)
Potassium: 4.2 mmol/L (ref 3.5–5.1)
Sodium: 132 mmol/L — ABNORMAL LOW (ref 135–145)
Total Bilirubin: 0.5 mg/dL (ref 0.3–1.2)
Total Protein: 6.6 g/dL (ref 6.5–8.1)

## 2022-02-22 LAB — CBC WITH DIFFERENTIAL/PLATELET
Abs Immature Granulocytes: 0.02 10*3/uL (ref 0.00–0.07)
Basophils Absolute: 0.1 10*3/uL (ref 0.0–0.1)
Basophils Relative: 1 %
Eosinophils Absolute: 0.5 10*3/uL (ref 0.0–0.5)
Eosinophils Relative: 8 %
HCT: 39.4 % (ref 39.0–52.0)
Hemoglobin: 12.9 g/dL — ABNORMAL LOW (ref 13.0–17.0)
Immature Granulocytes: 0 %
Lymphocytes Relative: 20 %
Lymphs Abs: 1.3 10*3/uL (ref 0.7–4.0)
MCH: 31 pg (ref 26.0–34.0)
MCHC: 32.7 g/dL (ref 30.0–36.0)
MCV: 94.7 fL (ref 80.0–100.0)
Monocytes Absolute: 0.6 10*3/uL (ref 0.1–1.0)
Monocytes Relative: 10 %
Neutro Abs: 3.9 10*3/uL (ref 1.7–7.7)
Neutrophils Relative %: 61 %
Platelets: 231 10*3/uL (ref 150–400)
RBC: 4.16 MIL/uL — ABNORMAL LOW (ref 4.22–5.81)
RDW: 12.4 % (ref 11.5–15.5)
WBC: 6.4 10*3/uL (ref 4.0–10.5)
nRBC: 0 % (ref 0.0–0.2)

## 2022-02-22 LAB — MAGNESIUM: Magnesium: 2.1 mg/dL (ref 1.7–2.4)

## 2022-02-22 LAB — TSH: TSH: 1.921 u[IU]/mL (ref 0.350–4.500)

## 2022-02-22 MED ORDER — SODIUM CHLORIDE 0.9 % IV SOLN
400.0000 mg | Freq: Once | INTRAVENOUS | Status: AC
  Administered 2022-02-22: 400 mg via INTRAVENOUS
  Filled 2022-02-22: qty 16

## 2022-02-22 MED ORDER — SODIUM CHLORIDE 0.9 % IV SOLN: Freq: Once | INTRAVENOUS | Status: AC

## 2022-02-22 NOTE — Patient Instructions (Signed)
[place] at [facility] Discharge Instructions   You were seen and examined today by Dr. Delton Coombes.  He reviewed the results of your lab work and scan which are normal/stable.   We will proceed with your treatment today and every 6 weeks.  We will see you back in 4 months with a repeat scan of your chest prior to that visit.    Thank you for choosing [place] at [facility] to provide your oncology and hematology care.  To afford each patient quality time with our provider, please arrive at least 15 minutes before your scheduled appointment time.   If you have a lab appointment with the [facility] please come in thru the Main Entrance and check in at the main information desk.  You need to re-schedule your appointment should you arrive 10 or more minutes late.  We strive to give you quality time with our providers, and arriving late affects you and other patients whose appointments are after yours.  Also, if you no show three or more times for appointments you may be dismissed from the clinic at the providers discretion.     Again, thank you for choosing [facility].  Our hope is that these requests will decrease the amount of time that you wait before being seen by our physicians.       _____________________________________________________________  Should you have questions after your visit to [facility], please contact our office at [phone] and follow the prompts.  Our office hours are 8:00 a.m. and 4:30 p.m. Monday - Friday.  Please note that voicemails left after 4:00 p.m. may not be returned until the following business day.  We are closed weekends and major holidays.  You do have access to a nurse 24-7, just call the main number to the clinic [phone] and do not press any options, hold on the line and a nurse will answer the phone.    For prescription refill requests, have your pharmacy contact  our office and allow 72 hours.    Due to Covid, you will need to wear a mask upon entering the hospital. If you do not have a mask, a mask will be given to you at the Main Entrance upon arrival. For doctor visits, patients may have 1 support person age 88 or older with them. For treatment visits, patients can not have anyone with them due to social distancing guidelines and our immunocompromised population.

## 2022-02-22 NOTE — Progress Notes (Signed)
Patient has been examined by Dr. Katragadda, and vital signs and labs have been reviewed. ANC, Creatinine, LFTs, hemoglobin, and platelets are within treatment parameters per M.D. - pt may proceed with treatment.    °

## 2022-02-22 NOTE — Progress Notes (Signed)
Patient presents today for Keytruda infusion per providers order.  Vital signs and labs reviewed by MD, message received from Anastasio Champion RN/Dr. Delton Coombes patient okay for treatment.    Keytruda given today per MD orders.  Stable during infusion without adverse affects.  Vital signs stable.  No complaints at this time.  Discharge from clinic ambulatory in stable condition.  Alert and oriented X 3.  Follow up with [facility] as scheduled.

## 2022-02-22 NOTE — Progress Notes (Signed)
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  PRIOR THERAPY: none  NGS Results: PD-L1 TPS >95%; EGFR, ALK, ROS1, RET, BRAF V600E negative  CURRENT THERAPY: Keytruda every 6 weeks  BRIEF ONCOLOGIC HISTORY:  Oncology History  Adenocarcinoma of lung, stage 4, right ([place])  12/08/2020 Initial Diagnosis   Adenocarcinoma of lung, stage 4, right ([place])    12/08/2020 Cancer Staging   Staging form: Lung, AJCC 8th Edition - Clinical stage from 12/08/2020: Stage IVB (cT2b, cN3, pM1c) - Signed by [name], MD on 12/08/2020 Histopathologic type: Adenocarcinoma, NOS    12/29/2020 -  Chemotherapy   Patient is on Treatment Plan : LUNG Pembrolizumab q42d        CANCER STAGING:  Cancer Staging  Adenocarcinoma of lung, stage 4, right [facility]) Staging form: Lung, AJCC 8th Edition - Clinical stage from 12/08/2020: Stage IVB (cT2b, cN3, pM1c) - Signed by [name], MD on 12/08/2020   INTERVAL HISTORY:  Antonio Payne, a 66 y.o. male, returns for routine follow-up and consideration for next cycle of chemotherapy. Antonio Payne was last seen on 11/30/2021.  Due for cycle #11 of Keytruda today.   Overall, he tells me he has been feeling pretty well. His weight is stable. He denies skin rash, fatigue, headaches, vision changes, infections, and n/v/d.    Overall, he feels ready for next cycle of chemo today.    REVIEW OF SYSTEMS:  Review of Systems  Constitutional:  Negative for appetite change, fatigue and unexpected weight change.  Eyes:  Negative for eye problems.  Gastrointestinal:  Negative for diarrhea, nausea and vomiting.  Skin:  Negative for rash.  Neurological:  Negative for headaches.  All other systems reviewed and are negative.  PAST MEDICAL/SURGICAL HISTORY:  Past Medical History:  Diagnosis Date    Cancer ([place])    lung cancer    Past Surgical History:  Procedure Laterality Date   BACK SURGERY      SOCIAL HISTORY:  Social History   Socioeconomic History   Marital status: Single    Spouse name: Not on file   Number of children: Not on file   Years of education: Not on file   Highest education level: Not on file  Occupational History   Not on file  Tobacco Use   Smoking status: Former    Types: Cigarettes    Quit date: 12/01/2019    Years since quitting: 2.2   Smokeless tobacco: Never  Substance and Sexual Activity   Alcohol use: Never   Drug use: Never   Sexual activity: Not Currently  Other Topics Concern   Not on file  Social History Narrative   Not on file   Social Determinants of Health   Financial Resource Strain: Not on file  Food Insecurity: Not on file  Transportation Needs: Not on file  Physical Activity: Not on file  Stress: Not on file  Social Connections: Not on file  Intimate Partner Violence: Not on file    FAMILY HISTORY:  No family history on file.  CURRENT MEDICATIONS:  Current Outpatient Medications  Medication Sig Dispense Refill   albuterol (VENTOLIN HFA) 108 (90 Base) MCG/ACT inhaler INHALE 2 PUFFS INTO THE LUNGS EVERY 4 HOURS AS NEEDED FOR WHEEZING OR SHORTNESS OF BREATH 18 g 1   fluticasone furoate-vilanterol (BREO ELLIPTA) 200-25 MCG/ACT  AEPB INHALE 1 PUFF INTO THE LUNGS EVERY MORNING 60 each 11   No current facility-administered medications for this visit.    ALLERGIES:  No Known Allergies  PHYSICAL EXAM:  Performance status (ECOG): 1 - Symptomatic but completely ambulatory  Vitals:   02/22/22 1311  BP: (!) 152/80  Pulse: 65  Resp: 16  Temp: (!) 97.5 F (36.4 C)  SpO2: 96%   Wt Readings from Last 3 Encounters:  02/22/22 149 lb 8 oz (67.8 kg)  01/11/22 151 lb (68.5 kg)  11/30/21 148 lb 8 oz (67.4 kg)   Physical Exam Vitals reviewed.  Constitutional:      Appearance: Normal appearance.  Cardiovascular:      Rate and Rhythm: Normal rate and regular rhythm.     Pulses: Normal pulses.     Heart sounds: Normal heart sounds.  Pulmonary:     Effort: Pulmonary effort is normal.     Breath sounds: Normal breath sounds.  Neurological:     General: No focal deficit present.     Mental Status: He is alert and oriented to person, place, and time.  Psychiatric:        Mood and Affect: Mood normal.        Behavior: Behavior normal.    LABORATORY DATA:  I have reviewed the labs as listed.     Latest Ref Rng & Units 02/22/2022   11:41 AM 01/11/2022   11:58 AM 11/30/2021   12:41 PM  CBC  WBC 4.0 - 10.5 K/uL 6.4   7.6   6.5    Hemoglobin 13.0 - 17.0 g/dL 12.9   13.3   13.2    Hematocrit 39.0 - 52.0 % 39.4   39.7   39.8    Platelets 150 - 400 K/uL 231   258   249        Latest Ref Rng & Units 02/22/2022   11:41 AM 01/11/2022   11:58 AM 11/30/2021   12:41 PM  CMP  Glucose 70 - 99 mg/dL 110   104   71    BUN 8 - 23 mg/dL _0 Creatinine 0.61 - 1.24 mg/dL 0.76   0.82   1.02    Sodium 135 - 145 mmol/L 132   128   136    Potassium 3.5 - 5.1 mmol/L 4.2   4.5   4.1    Chloride 98 - 111 mmol/L 101   96   99    CO2 22 - 32 mmol/L _1 Calcium 8.9 - 10.3 mg/dL 8.7   9.1   9.2    Total Protein 6.5 - 8.1 g/dL 6.6   7.3   7.2    Total Bilirubin 0.3 - 1.2 mg/dL 0.5   0.6   0.4    Alkaline Phos 38 - 126 U/L 70   74   70    AST 15 - 41 U/L _2 ALT 0 - 44 U/L _3 DIAGNOSTIC IMAGING:  I have independently reviewed the scans and discussed with the patient. CT Chest W Contrast  Result Date: 02/16/2022 CLINICAL DATA:  Follow-up for metastatic right lung adenocarcinoma. Currently receiving immunotherapy. * Tracking Code: BO * EXAM: CT CHEST WITH CONTRAST TECHNIQUE: Multidetector CT imaging of the chest was performed during intravenous contrast administration.  RADIATION DOSE REDUCTION: This exam was performed according to the departmental dose-optimization program which  includes automated exposure control, adjustment of the mA and/or kV according to patient size and/or use of iterative reconstruction technique. CONTRAST:  95m OMNIPAQUE IOHEXOL 300 MG/ML  SOLN COMPARISON:  Multiple priors including most recent CT October 14, 2021 FINDINGS: Cardiovascular: Aortic and branch vessel atherosclerosis without thoracic aortic aneurysm. No central pulmonary embolus on this nondedicated study. Three-vessel coronary artery calcifications. Normal size heart. No significant pericardial effusion/thickening. Mediastinum/Nodes: No suspicious thyroid nodule. No pathologically enlarged mediastinal or hilar lymph nodes. No right axillary or right subpectoral adenopathy. Enlarged left axillary and subpectoral lymph nodes are to similar prior. Index lymph nodes are as follows. High left axillary lymph node measures 13 mm in short axis on image 23/2 previously 14 mm. -subpectoral lymph node measures 13 mm in short axis on image 35/2, unchanged. Lungs/Pleura: Spiculated right upper lobe pulmonary mass measures 3.2 x 2.0 cm on image 46/2 previously 3.1 x 2.2 on October 14, 2021 and 3.1 x 2.2 cm on July 18, 2021 when remeasured for consistency. Left upper lobe pulmonary nodule measures 6 mm x 6 mm on image 31/4 previously 9 mm x 6 mm. Nodule in the superior segment of the left lower lobe measures 1.2 x 1.0 cm on image 70/4 previously 1.1 x 0.6 cm. A few additional scattered smaller pulmonary nodules are again seen and appears similar prior study. No new suspicious pulmonary nodules or masses identified. No pleural effusion.  No pneumothorax. Diffuse bronchial wall thickening with mild centrilobular and paraseptal emphysema. Upper Abdomen: No acute abnormality. Musculoskeletal: No aggressive lytic or blastic lesion of bone. Multilevel degenerative changes spine. IMPRESSION: 1. Slight interval increase in size of a solid superior segment of the left lower lobe pulmonary nodule, nonspecific, suggest  attention on short interval follow-up chest CT. 2. No significant interval change in size of the spiculated right upper lobe pulmonary mass or other scattered pulmonary nodules. 3. Similar left axillary and subpectoral lymphadenopathy, nonspecific but once again would consider further evaluation with targeted ultrasound and aspiration/biopsy. 4. Aortic Atherosclerosis (ICD10-I70.0) and Emphysema (ICD10-J43.9). Electronically Signed   By: JDahlia BailiffM.D.   On: 02/16/2022 12:54     ASSESSMENT:  1.  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 -Mutations were negative for EGFR, ALK, ROS1, RET, BRAF V600 -PD-L1 22 C3 greater than 95%. -He was started on single agent pembrolizumab every 3 weeks under the direction of Dr. RDola Factorin [place] August 2020, later switched to every 6 weeks. -We will consider testing for K-ras G 12 C, NTR K and met exon 14 mutations upon progression.   2.  Social/family history: -He is a retired tAdministrator  Quit smoking in August 2020. -Father had prostate cancer.   PLAN:  1.  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 - CT chest (02/15/2022): Reviewed by me shows spiculated right upper lobe lung mass measuring 3.2 x 2.0 cm (3.1 x 2.2 cm).  Left upper lobe lung nodule is slightly better.  Nodule in the superior segment of the left lower lobe is stable.  No new disease was seen. - Reviewed labs today which showed normal LFTs and creatinine.  Mild anemia with hemoglobin 12.9.  TSH was 1.9. - Proceed with  treatment every 6 weeks of Keytruda.  RTC 4 months with repeat CT of the chest with contrast.   2.  Brain metastasis: - He never received prior brain radiation therapy. - MRI of the brain on 11/22/2021: Did not show any evidence of metastatic  disease in the brain.  Redemonstrated sclerotic focus in the C3 vertebral body unchanged.   3.  COPD: - Continue Breo Ellipta once daily and Ventolin as needed.   Orders placed this encounter:  No orders of the defined types were placed in this encounter.    [name], MD [place] [phone]   I, Thana Ates, am acting as a scribe for Dr. [name].  I, [name] MD, have reviewed the above documentation for accuracy and completeness, and I agree with the above.

## 2022-02-22 NOTE — Patient Instructions (Signed)
[place]  Discharge Instructions: Thank you for choosing [place] to provide your oncology and hematology care.  If you have a lab appointment with the [facility], please come in thru the Main Entrance and check in at the main information desk.  Wear comfortable clothing and clothing appropriate for easy access to any Portacath or PICC line.   We strive to give you quality time with your provider. You may need to reschedule your appointment if you arrive late (15 or more minutes).  Arriving late affects you and other patients whose appointments are after yours.  Also, if you miss three or more appointments without notifying the office, you may be dismissed from the clinic at the provider's discretion.      For prescription refill requests, have your pharmacy contact our office and allow 72 hours for refills to be completed.    Today you received the following chemotherapy and/or immunotherapy agents Keytruda      To help prevent nausea and vomiting after your treatment, we encourage you to take your nausea medication as directed.  BELOW ARE SYMPTOMS THAT SHOULD BE REPORTED IMMEDIATELY: *FEVER GREATER THAN 100.4 F (38 C) OR HIGHER *CHILLS OR SWEATING *NAUSEA AND VOMITING THAT IS NOT CONTROLLED WITH YOUR NAUSEA MEDICATION *UNUSUAL SHORTNESS OF BREATH *UNUSUAL BRUISING OR BLEEDING *URINARY PROBLEMS (pain or burning when urinating, or frequent urination) *BOWEL PROBLEMS (unusual diarrhea, constipation, pain near the anus) TENDERNESS IN MOUTH AND THROAT WITH OR WITHOUT PRESENCE OF ULCERS (sore throat, sores in mouth, or a toothache) UNUSUAL RASH, SWELLING OR PAIN  UNUSUAL VAGINAL DISCHARGE OR ITCHING   Items with * indicate a potential emergency and should be followed up as soon as possible or go to the Emergency Department if any problems should occur.  Please show the CHEMOTHERAPY ALERT CARD or IMMUNOTHERAPY ALERT CARD at check-in to the Emergency  Department and triage nurse.  Should you have questions after your visit or need to cancel or reschedule your appointment, please contact [facility] [phone]  and follow the prompts.  Office hours are 8:00 a.m. to 4:30 p.m. Monday - Friday. Please note that voicemails left after 4:00 p.m. may not be returned until the following business day.  We are closed weekends and major holidays. You have access to a nurse at all times for urgent questions. Please call the main number to the clinic [phone] and follow the prompts.  For any non-urgent questions, you may also contact your provider using MyChart. We now offer e-Visits for anyone 49 and older to request care online for non-urgent symptoms. For details visit [email or web address].   Also download the MyChart app! Go to the app store, search "MyChart", open the app, select [place], and log in with your MyChart username and password.  Due to Covid, a mask is required upon entering the hospital/clinic. If you do not have a mask, one will be given to you upon arrival. For doctor visits, patients may have 1 support person aged 29 or older with them. For treatment visits, patients cannot have anyone with them due to current Covid guidelines and our immunocompromised population.

## 2022-03-04 ENCOUNTER — Other Ambulatory Visit (HOSPITAL_COMMUNITY): Payer: Self-pay | Admitting: Hematology

## 2022-03-06 ENCOUNTER — Encounter (HOSPITAL_COMMUNITY): Payer: Self-pay | Admitting: Hematology

## 2022-03-11 ENCOUNTER — Other Ambulatory Visit: Payer: Self-pay | Admitting: Nurse Practitioner

## 2022-04-04 ENCOUNTER — Other Ambulatory Visit (HOSPITAL_COMMUNITY): Payer: Self-pay | Admitting: Hematology

## 2022-04-05 ENCOUNTER — Inpatient Hospital Stay (HOSPITAL_COMMUNITY): Payer: Medicare Other

## 2022-04-05 ENCOUNTER — Encounter (HOSPITAL_COMMUNITY): Payer: Self-pay

## 2022-04-05 ENCOUNTER — Encounter (HOSPITAL_COMMUNITY): Payer: Self-pay | Admitting: Hematology

## 2022-04-05 ENCOUNTER — Inpatient Hospital Stay (HOSPITAL_COMMUNITY): Payer: Medicare Other | Attending: Hematology

## 2022-04-05 VITALS — BP 126/79 | HR 73 | Temp 96.5°F | Resp 18 | Wt 149.0 lb

## 2022-04-05 DIAGNOSIS — Z5112 Encounter for antineoplastic immunotherapy: Secondary | ICD-10-CM | POA: Diagnosis present

## 2022-04-05 DIAGNOSIS — C3491 Malignant neoplasm of unspecified part of right bronchus or lung: Secondary | ICD-10-CM | POA: Insufficient documentation

## 2022-04-05 DIAGNOSIS — Z79899 Other long term (current) drug therapy: Secondary | ICD-10-CM | POA: Insufficient documentation

## 2022-04-05 LAB — COMPREHENSIVE METABOLIC PANEL
ALT: 16 U/L (ref 0–44)
AST: 19 U/L (ref 15–41)
Albumin: 4 g/dL (ref 3.5–5.0)
Alkaline Phosphatase: 72 U/L (ref 38–126)
Anion gap: 5 (ref 5–15)
BUN: 10 mg/dL (ref 8–23)
CO2: 27 mmol/L (ref 22–32)
Calcium: 8.8 mg/dL — ABNORMAL LOW (ref 8.9–10.3)
Chloride: 102 mmol/L (ref 98–111)
Creatinine, Ser: 0.72 mg/dL (ref 0.61–1.24)
GFR, Estimated: >60 mL/min (ref >60)
Glucose, Bld: 100 mg/dL — ABNORMAL HIGH (ref 70–99)
Potassium: 4.2 mmol/L (ref 3.5–5.1)
Sodium: 134 mmol/L — ABNORMAL LOW (ref 135–145)
Total Bilirubin: 0.5 mg/dL (ref 0.3–1.2)
Total Protein: 6.7 g/dL (ref 6.5–8.1)

## 2022-04-05 LAB — CBC WITH DIFFERENTIAL/PLATELET
Abs Immature Granulocytes: 0.02 10*3/uL (ref 0.00–0.07)
Basophils Absolute: 0.1 10*3/uL (ref 0.0–0.1)
Basophils Relative: 1 %
Eosinophils Absolute: 0.5 10*3/uL (ref 0.0–0.5)
Eosinophils Relative: 8 %
HCT: 39.4 % (ref 39.0–52.0)
Hemoglobin: 13.1 g/dL (ref 13.0–17.0)
Immature Granulocytes: 0 %
Lymphocytes Relative: 21 %
Lymphs Abs: 1.4 10*3/uL (ref 0.7–4.0)
MCH: 31.6 pg (ref 26.0–34.0)
MCHC: 33.2 g/dL (ref 30.0–36.0)
MCV: 95.2 fL (ref 80.0–100.0)
Monocytes Absolute: 0.6 10*3/uL (ref 0.1–1.0)
Monocytes Relative: 8 %
Neutro Abs: 4.2 10*3/uL (ref 1.7–7.7)
Neutrophils Relative %: 62 %
Platelets: 229 10*3/uL (ref 150–400)
RBC: 4.14 MIL/uL — ABNORMAL LOW (ref 4.22–5.81)
RDW: 12.4 % (ref 11.5–15.5)
WBC: 6.7 10*3/uL (ref 4.0–10.5)
nRBC: 0 % (ref 0.0–0.2)

## 2022-04-05 LAB — TSH: TSH: 1.582 u[IU]/mL (ref 0.350–4.500)

## 2022-04-05 LAB — MAGNESIUM: Magnesium: 2 mg/dL (ref 1.7–2.4)

## 2022-04-05 MED ORDER — HEPARIN SOD (PORK) LOCK FLUSH 100 UNIT/ML IV SOLN: 500.0000 [IU] | Freq: Once | INTRAVENOUS | Status: DC | PRN

## 2022-04-05 MED ORDER — SODIUM CHLORIDE 0.9% FLUSH
10.0000 mL | INTRAVENOUS | Status: DC | PRN
  Administered 2022-04-05: 10 mL

## 2022-04-05 MED ORDER — SODIUM CHLORIDE 0.9 % IV SOLN: Freq: Once | INTRAVENOUS | Status: AC

## 2022-04-05 MED ORDER — SODIUM CHLORIDE 0.9 % IV SOLN
400.0000 mg | Freq: Once | INTRAVENOUS | Status: AC
  Administered 2022-04-05: 400 mg via INTRAVENOUS
  Filled 2022-04-05: qty 16

## 2022-04-05 NOTE — Patient Instructions (Signed)
[place]  Discharge Instructions: Thank you for choosing [facility] [place] to provide your oncology and hematology care.  If you have a lab appointment with the [facility], please come in thru the Main Entrance and check in at the main information desk.  Wear comfortable clothing and clothing appropriate for easy access to any Portacath or PICC line.   We strive to give you quality time with your provider. You may need to reschedule your appointment if you arrive late (15 or more minutes).  Arriving late affects you and other patients whose appointments are after yours.  Also, if you miss three or more appointments without notifying the office, you may be dismissed from the clinic at the provider's discretion.      For prescription refill requests, have your pharmacy contact our office and allow 72 hours for refills to be completed.    Today you received the following chemotherapy and/or immunotherapy agents Keytruda.   Pembrolizumab injection What is this medication? PEMBROLIZUMAB (pem broe liz ue mab) is a monoclonal antibody. It is used to treat certain types of cancer. This medicine may be used for other purposes; ask your health care provider or pharmacist if you have questions. COMMON BRAND NAME(S): Keytruda What should I tell my care team before I take this medication? They need to know if you have any of these conditions: autoimmune diseases like Crohn's disease, ulcerative colitis, or lupus have had or planning to have an allogeneic stem cell transplant (uses someone else's stem cells) history of organ transplant history of chest radiation nervous system problems like myasthenia gravis or Guillain-Barre syndrome an unusual or allergic reaction to pembrolizumab, other medicines, foods, dyes, or preservatives pregnant or trying to get pregnant breast-feeding How should I use this medication? This medicine is for infusion into a vein. It is given by a  health care professional in a hospital or clinic setting. A special MedGuide will be given to you before each treatment. Be sure to read this information carefully each time. Talk to your pediatrician regarding the use of this medicine in children. While this drug may be prescribed for children as young as 6 months for selected conditions, precautions do apply. Overdosage: If you think you have taken too much of this medicine contact a poison control center or emergency room at once. NOTE: This medicine is only for you. Do not share this medicine with others. What if I miss a dose? It is important not to miss your dose. Call your doctor or health care professional if you are unable to keep an appointment. What may interact with this medication? Interactions have not been studied. This list may not describe all possible interactions. Give your health care provider a list of all the medicines, herbs, non-prescription drugs, or dietary supplements you use. Also tell them if you smoke, drink alcohol, or use illegal drugs. Some items may interact with your medicine. What should I watch for while using this medication? Your condition will be monitored carefully while you are receiving this medicine. You may need blood work done while you are taking this medicine. Do not become pregnant while taking this medicine or for 4 months after stopping it. Women should inform their doctor if they wish to become pregnant or think they might be pregnant. There is a potential for serious side effects to an unborn child. Talk to your health care professional or pharmacist for more information. Do not breast-feed an infant while taking this medicine or  for 4 months after the last dose. What side effects may I notice from receiving this medication? Side effects that you should report to your doctor or health care professional as soon as possible: allergic reactions like skin rash, itching or hives, swelling of the face,  lips, or tongue bloody or black, tarry breathing problems changes in vision chest pain chills confusion constipation cough diarrhea dizziness or feeling faint or lightheaded fast or irregular heartbeat fever flushing joint pain low blood counts - this medicine may decrease the number of white blood cells, red blood cells and platelets. You may be at increased risk for infections and bleeding. muscle pain muscle weakness pain, tingling, numbness in the hands or feet persistent headache redness, blistering, peeling or loosening of the skin, including inside the mouth signs and symptoms of high blood sugar such as dizziness; dry mouth; dry skin; fruity breath; nausea; stomach pain; increased hunger or thirst; increased urination signs and symptoms of kidney injury like trouble passing urine or change in the amount of urine signs and symptoms of liver injury like dark urine, light-colored stools, loss of appetite, nausea, right upper belly pain, yellowing of the eyes or skin sweating swollen lymph nodes weight loss Side effects that usually do not require medical attention (report to your doctor or health care professional if they continue or are bothersome): decreased appetite hair loss tiredness This list may not describe all possible side effects. Call your doctor for medical advice about side effects. You may report side effects to FDA at 1-800-FDA-1088. Where should I keep my medication? This drug is given in a hospital or clinic and will not be stored at home. NOTE: This sheet is a summary. It may not cover all possible information. If you have questions about this medicine, talk to your doctor, pharmacist, or health care provider.  2023 Elsevier/Gold Standard (2021-08-19 00:00:00)    To help prevent nausea and vomiting after your treatment, we encourage you to take your nausea medication as directed.  BELOW ARE SYMPTOMS THAT SHOULD BE REPORTED IMMEDIATELY: *FEVER GREATER  THAN 100.4 F (38 C) OR HIGHER *CHILLS OR SWEATING *NAUSEA AND VOMITING THAT IS NOT CONTROLLED WITH YOUR NAUSEA MEDICATION *UNUSUAL SHORTNESS OF BREATH *UNUSUAL BRUISING OR BLEEDING *URINARY PROBLEMS (pain or burning when urinating, or frequent urination) *BOWEL PROBLEMS (unusual diarrhea, constipation, pain near the anus) TENDERNESS IN MOUTH AND THROAT WITH OR WITHOUT PRESENCE OF ULCERS (sore throat, sores in mouth, or a toothache) UNUSUAL RASH, SWELLING OR PAIN  UNUSUAL VAGINAL DISCHARGE OR ITCHING   Items with * indicate a potential emergency and should be followed up as soon as possible or go to the Emergency Department if any problems should occur.  Please show the CHEMOTHERAPY ALERT CARD or IMMUNOTHERAPY ALERT CARD at check-in to the Emergency Department and triage nurse.  Should you have questions after your visit or need to cancel or reschedule your appointment, please contact [facility] [phone]  and follow the prompts.  Office hours are 8:00 a.m. to 4:30 p.m. Monday - Friday. Please note that voicemails left after 4:00 p.m. may not be returned until the following business day.  We are closed weekends and major holidays. You have access to a nurse at all times for urgent questions. Please call the main number to the clinic [phone] and follow the prompts.  For any non-urgent questions, you may also contact your provider using MyChart. We now offer e-Visits for anyone 53 and older to request care online for non-urgent symptoms.  For details visit [email or web address].   Also download the MyChart app! Go to the app store, search "MyChart", open the app, select [place], and log in with your MyChart username and password.  Masks are optional in the cancer centers. If you would like for your care team to wear a mask while they are taking care of you, please let them know. For doctor visits, patients may have with them one support person who is at least 66 years  old. At this time, visitors are not allowed in the infusion area.

## 2022-04-05 NOTE — Progress Notes (Signed)
Labs reviewed. Ok to treat per parameters

## 2022-04-05 NOTE — Progress Notes (Signed)
Patient tolerated therapy with no complaints voiced. Side effects with management reviewed with understanding verbalized. IV site clean and dry with no bruising or swelling noted at site. Good blood return noted before and after administration of therapy. Band aid applied. Patient left in satisfactory condition with VSS and no s/s of distress noted.

## 2022-04-24 ENCOUNTER — Other Ambulatory Visit: Payer: Self-pay

## 2022-05-03 ENCOUNTER — Other Ambulatory Visit: Payer: Self-pay

## 2022-05-10 ENCOUNTER — Other Ambulatory Visit (HOSPITAL_COMMUNITY): Payer: Self-pay | Admitting: Hematology

## 2022-05-17 ENCOUNTER — Inpatient Hospital Stay: Payer: Medicare Other

## 2022-05-17 ENCOUNTER — Inpatient Hospital Stay (HOSPITAL_COMMUNITY): Payer: Medicare Other | Attending: Hematology

## 2022-05-17 VITALS — BP 118/82 | HR 82 | Temp 98.4°F | Resp 18 | Wt 147.0 lb

## 2022-05-17 DIAGNOSIS — Z5112 Encounter for antineoplastic immunotherapy: Secondary | ICD-10-CM | POA: Insufficient documentation

## 2022-05-17 DIAGNOSIS — Z79899 Other long term (current) drug therapy: Secondary | ICD-10-CM | POA: Insufficient documentation

## 2022-05-17 DIAGNOSIS — C3491 Malignant neoplasm of unspecified part of right bronchus or lung: Secondary | ICD-10-CM | POA: Insufficient documentation

## 2022-05-17 LAB — CBC WITH DIFFERENTIAL/PLATELET
Abs Immature Granulocytes: 0.01 10*3/uL (ref 0.00–0.07)
Basophils Absolute: 0.1 10*3/uL (ref 0.0–0.1)
Basophils Relative: 1 %
Eosinophils Absolute: 0.6 10*3/uL — ABNORMAL HIGH (ref 0.0–0.5)
Eosinophils Relative: 9 %
HCT: 39.3 % (ref 39.0–52.0)
Hemoglobin: 13.4 g/dL (ref 13.0–17.0)
Immature Granulocytes: 0 %
Lymphocytes Relative: 21 %
Lymphs Abs: 1.3 10*3/uL (ref 0.7–4.0)
MCH: 32.3 pg (ref 26.0–34.0)
MCHC: 34.1 g/dL (ref 30.0–36.0)
MCV: 94.7 fL (ref 80.0–100.0)
Monocytes Absolute: 0.7 10*3/uL (ref 0.1–1.0)
Monocytes Relative: 10 %
Neutro Abs: 3.8 10*3/uL (ref 1.7–7.7)
Neutrophils Relative %: 59 %
Platelets: 224 10*3/uL (ref 150–400)
RBC: 4.15 MIL/uL — ABNORMAL LOW (ref 4.22–5.81)
RDW: 12.5 % (ref 11.5–15.5)
WBC: 6.5 10*3/uL (ref 4.0–10.5)
nRBC: 0 % (ref 0.0–0.2)

## 2022-05-17 LAB — COMPREHENSIVE METABOLIC PANEL
ALT: 16 U/L (ref 0–44)
AST: 19 U/L (ref 15–41)
Albumin: 4.2 g/dL (ref 3.5–5.0)
Alkaline Phosphatase: 70 U/L (ref 38–126)
Anion gap: 5 (ref 5–15)
BUN: 16 mg/dL (ref 8–23)
CO2: 25 mmol/L (ref 22–32)
Calcium: 8.9 mg/dL (ref 8.9–10.3)
Chloride: 105 mmol/L (ref 98–111)
Creatinine, Ser: 1 mg/dL (ref 0.61–1.24)
GFR, Estimated: >60 mL/min (ref >60)
Glucose, Bld: 86 mg/dL (ref 70–99)
Potassium: 4.3 mmol/L (ref 3.5–5.1)
Sodium: 135 mmol/L (ref 135–145)
Total Bilirubin: 0.6 mg/dL (ref 0.3–1.2)
Total Protein: 6.8 g/dL (ref 6.5–8.1)

## 2022-05-17 LAB — MAGNESIUM: Magnesium: 2.1 mg/dL (ref 1.7–2.4)

## 2022-05-17 LAB — TSH: TSH: 1.442 u[IU]/mL (ref 0.350–4.500)

## 2022-05-17 MED ORDER — SODIUM CHLORIDE 0.9 % IV SOLN: Freq: Once | INTRAVENOUS | Status: AC

## 2022-05-17 MED ORDER — SODIUM CHLORIDE 0.9% FLUSH: 10.0000 mL | INTRAVENOUS | Status: DC | PRN

## 2022-05-17 MED ORDER — HEPARIN SOD (PORK) LOCK FLUSH 100 UNIT/ML IV SOLN: 500.0000 [IU] | Freq: Once | INTRAVENOUS | Status: DC | PRN

## 2022-05-17 MED ORDER — SODIUM CHLORIDE 0.9 % IV SOLN
400.0000 mg | Freq: Once | INTRAVENOUS | Status: AC
  Administered 2022-05-17: 400 mg via INTRAVENOUS
  Filled 2022-05-17: qty 16

## 2022-05-17 NOTE — Progress Notes (Signed)
Patient presents today for Keytruda, labs and vitals within treatment parameters.  Patient tolerated therapy with no complaints voiced. Side effects with management reviewed with understanding verbalized. IV site clean and dry with no bruising or swelling noted at site. Good blood return noted before and after administration of therapy. Band aid applied. Patient left in satisfactory condition with VSS and no s/s of distress noted.

## 2022-05-17 NOTE — Patient Instructions (Signed)
[facility]  Discharge Instructions: Thank you for choosing [facility] to provide your oncology and hematology care.  If you have a lab appointment with the [facility], please come in thru the Main Entrance and check in at the main information desk.  Wear comfortable clothing and clothing appropriate for easy access to any Portacath or PICC line.   We strive to give you quality time with your provider. You may need to reschedule your appointment if you arrive late (15 or more minutes).  Arriving late affects you and other patients whose appointments are after yours.  Also, if you miss three or more appointments without notifying the office, you may be dismissed from the clinic at the provider's discretion.      For prescription refill requests, have your pharmacy contact our office and allow 72 hours for refills to be completed.    Today you received the following chemotherapy and/or immunotherapy agents Keytruda, return as scheduled.   To help prevent nausea and vomiting after your treatment, we encourage you to take your nausea medication as directed.  BELOW ARE SYMPTOMS THAT SHOULD BE REPORTED IMMEDIATELY: *FEVER GREATER THAN 100.4 F (38 C) OR HIGHER *CHILLS OR SWEATING *NAUSEA AND VOMITING THAT IS NOT CONTROLLED WITH YOUR NAUSEA MEDICATION *UNUSUAL SHORTNESS OF BREATH *UNUSUAL BRUISING OR BLEEDING *URINARY PROBLEMS (pain or burning when urinating, or frequent urination) *BOWEL PROBLEMS (unusual diarrhea, constipation, pain near the anus) TENDERNESS IN MOUTH AND THROAT WITH OR WITHOUT PRESENCE OF ULCERS (sore throat, sores in mouth, or a toothache) UNUSUAL RASH, SWELLING OR PAIN  UNUSUAL VAGINAL DISCHARGE OR ITCHING   Items with * indicate a potential emergency and should be followed up as soon as possible or go to the Emergency Department if any problems should occur.  Please show the CHEMOTHERAPY ALERT CARD or IMMUNOTHERAPY ALERT CARD at  check-in to the Emergency Department and triage nurse.  Should you have questions after your visit or need to cancel or reschedule your appointment, please contact [facility] [phone]  and follow the prompts.  Office hours are 8:00 a.m. to 4:30 p.m. Monday - Friday. Please note that voicemails left after 4:00 p.m. may not be returned until the following business day.  We are closed weekends and major holidays. You have access to a nurse at all times for urgent questions. Please call the main number to the clinic [phone] and follow the prompts.  For any non-urgent questions, you may also contact your provider using MyChart. We now offer e-Visits for anyone 18 and older to request care online for non-urgent symptoms. For details visit [email or web address].   Also download the MyChart app! Go to the app store, search "MyChart", open the app, select [facility], and log in with your MyChart username and password.  Masks are optional in the cancer centers. If you would like for your care team to wear a mask while they are taking care of you, please let them know. You may have one support person who is at least 66 years old accompany you for your appointments.

## 2022-05-21 ENCOUNTER — Other Ambulatory Visit: Payer: Self-pay

## 2022-05-31 ENCOUNTER — Other Ambulatory Visit: Payer: Self-pay | Admitting: Hematology

## 2022-05-31 DIAGNOSIS — C3491 Malignant neoplasm of unspecified part of right bronchus or lung: Secondary | ICD-10-CM

## 2022-06-10 ENCOUNTER — Other Ambulatory Visit (HOSPITAL_COMMUNITY): Payer: Self-pay | Admitting: Hematology

## 2022-06-21 ENCOUNTER — Ambulatory Visit (HOSPITAL_COMMUNITY)
Admission: RE | Admit: 2022-06-21 | Discharge: 2022-06-21 | Disposition: A | Discharge status: Home / Self Care | Payer: Medicare Other | Source: Ambulatory Visit | Attending: Hematology | Admitting: Hematology

## 2022-06-21 DIAGNOSIS — C3491 Malignant neoplasm of unspecified part of right bronchus or lung: Secondary | ICD-10-CM | POA: Insufficient documentation

## 2022-06-21 MED ORDER — IOHEXOL 300 MG/ML  SOLN
100.0000 mL | Freq: Once | INTRAMUSCULAR | Status: AC | PRN
  Administered 2022-06-21: 75 mL via INTRAVENOUS

## 2022-06-28 ENCOUNTER — Inpatient Hospital Stay: Payer: Medicare Other | Attending: Hematology | Admitting: Hematology

## 2022-06-28 ENCOUNTER — Inpatient Hospital Stay: Payer: Medicare Other

## 2022-06-28 VITALS — BP 127/65 | HR 80 | Temp 97.9°F | Resp 18

## 2022-06-28 DIAGNOSIS — C3491 Malignant neoplasm of unspecified part of right bronchus or lung: Secondary | ICD-10-CM | POA: Diagnosis not present

## 2022-06-28 DIAGNOSIS — J449 Chronic obstructive pulmonary disease, unspecified: Secondary | ICD-10-CM | POA: Insufficient documentation

## 2022-06-28 DIAGNOSIS — C7931 Secondary malignant neoplasm of brain: Secondary | ICD-10-CM | POA: Diagnosis not present

## 2022-06-28 DIAGNOSIS — Z5112 Encounter for antineoplastic immunotherapy: Secondary | ICD-10-CM | POA: Insufficient documentation

## 2022-06-28 DIAGNOSIS — Z79899 Other long term (current) drug therapy: Secondary | ICD-10-CM | POA: Diagnosis not present

## 2022-06-28 LAB — COMPREHENSIVE METABOLIC PANEL
ALT: 18 U/L (ref 0–44)
AST: 21 U/L (ref 15–41)
Albumin: 4.4 g/dL (ref 3.5–5.0)
Alkaline Phosphatase: 77 U/L (ref 38–126)
Anion gap: 10 (ref 5–15)
BUN: 13 mg/dL (ref 8–23)
CO2: 26 mmol/L (ref 22–32)
Calcium: 9 mg/dL (ref 8.9–10.3)
Chloride: 96 mmol/L — ABNORMAL LOW (ref 98–111)
Creatinine, Ser: 0.7 mg/dL (ref 0.61–1.24)
GFR, Estimated: >60 mL/min (ref >60)
Glucose, Bld: 82 mg/dL (ref 70–99)
Potassium: 4.4 mmol/L (ref 3.5–5.1)
Sodium: 132 mmol/L — ABNORMAL LOW (ref 135–145)
Total Bilirubin: 0.8 mg/dL (ref 0.3–1.2)
Total Protein: 7.3 g/dL (ref 6.5–8.1)

## 2022-06-28 LAB — TSH: TSH: 1.648 u[IU]/mL (ref 0.350–4.500)

## 2022-06-28 LAB — MAGNESIUM: Magnesium: 2.4 mg/dL (ref 1.7–2.4)

## 2022-06-28 MED ORDER — SODIUM CHLORIDE 0.9 % IV SOLN
400.0000 mg | Freq: Once | INTRAVENOUS | Status: AC
  Administered 2022-06-28: 400 mg via INTRAVENOUS
  Filled 2022-06-28: qty 16

## 2022-06-28 MED ORDER — FLUTICASONE FUROATE-VILANTEROL 200-25 MCG/ACT IN AEPB: INHALATION_SPRAY | RESPIRATORY_TRACT | 11 refills | Status: DC

## 2022-06-28 MED ORDER — SODIUM CHLORIDE 0.9 % IV SOLN: Freq: Once | INTRAVENOUS | Status: AC

## 2022-06-28 NOTE — Patient Instructions (Signed)
Antonio Payne  Discharge Instructions: Thank you for choosing [facility] to provide your oncology and hematology care.  If you have a lab appointment with the [facility], please come in thru the Main Entrance and check in at the main information desk.  Wear comfortable clothing and clothing appropriate for easy access to any Portacath or PICC line.   We strive to give you quality time with your provider. You may need to reschedule your appointment if you arrive late (15 or more minutes).  Arriving late affects you and other patients whose appointments are after yours.  Also, if you miss three or more appointments without notifying the office, you may be dismissed from the clinic at the provider's discretion.      For prescription refill requests, have your pharmacy contact our office and allow 72 hours for refills to be completed.    Today you received the following chemotherapy and/or immunotherapy agents Beryle Flock      To help prevent nausea and vomiting after your treatment, we encourage you to take your nausea medication as directed.  BELOW ARE SYMPTOMS THAT SHOULD BE REPORTED IMMEDIATELY: *FEVER GREATER THAN 100.4 F (38 C) OR HIGHER *CHILLS OR SWEATING *NAUSEA AND VOMITING THAT IS NOT CONTROLLED WITH YOUR NAUSEA MEDICATION *UNUSUAL SHORTNESS OF BREATH *UNUSUAL BRUISING OR BLEEDING *URINARY PROBLEMS (pain or burning when urinating, or frequent urination) *BOWEL PROBLEMS (unusual diarrhea, constipation, pain near the anus) TENDERNESS IN MOUTH AND THROAT WITH OR WITHOUT PRESENCE OF ULCERS (sore throat, sores in mouth, or a toothache) UNUSUAL RASH, SWELLING OR PAIN  UNUSUAL VAGINAL DISCHARGE OR ITCHING   Items with * indicate a potential emergency and should be followed up as soon as possible or go to the Emergency Department if any problems should occur.  Please show the CHEMOTHERAPY ALERT CARD or IMMUNOTHERAPY ALERT CARD at check-in to the  Emergency Department and triage nurse.  Should you have questions after your visit or need to cancel or reschedule your appointment, please contact [facility] [phone]  and follow the prompts.  Office hours are 8:00 a.m. to 4:30 p.m. Monday - Friday. Please note that voicemails left after 4:00 p.m. may not be returned until the following business day.  We are closed weekends and major holidays. You have access to a nurse at all times for urgent questions. Please call the main number to the clinic [phone] and follow the prompts.  For any non-urgent questions, you may also contact your provider using MyChart. We now offer e-Visits for anyone 69 and older to request care online for non-urgent symptoms. For details visit [email or web address].   Also download the MyChart app! Go to the app store, search "MyChart", open the app, select [facility], and log in with your MyChart username and password.  Masks are optional in the cancer centers. If you would like for your care team to wear a mask while they are taking care of you, please let them know. You may have one support person who is at least 66 years old accompany you for your appointments.

## 2022-06-28 NOTE — Progress Notes (Signed)
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  PRIOR THERAPY: none  NGS Results: PD-L1 TPS >95%; EGFR, ALK, ROS1, RET, BRAF V600E negative  CURRENT THERAPY: Keytruda every 6 weeks  BRIEF ONCOLOGIC HISTORY:  Oncology History  Adenocarcinoma of lung, stage 4, right ([facility])  12/08/2020 Initial Diagnosis   Adenocarcinoma of lung, stage 4, right ([facility])   12/08/2020 Cancer Staging   Staging form: Lung, AJCC 8th Edition - Clinical stage from 12/08/2020: Stage IVB (cT2b, cN3, pM1c) - Signed by [name], MD on 12/08/2020 Histopathologic type: Adenocarcinoma, NOS   12/29/2020 - 05/17/2022 Chemotherapy   Patient is on Treatment Plan : LUNG Pembrolizumab q42d     12/29/2020 -  Chemotherapy   Patient is on Treatment Plan : LUNG Pembrolizumab (400) q42d       CANCER STAGING:  Cancer Staging  Adenocarcinoma of lung, stage 4, right ([place] [facility]) Staging form: Lung, AJCC 8th Edition - Clinical stage from 12/08/2020: Stage IVB (cT2b, cN3, pM1c) - Signed by [name], MD on 12/08/2020   INTERVAL HISTORY:  Antonio Payne, a 66 y.o. male, seen for follow-up of lung cancer.  He denied any recent infections, particularly lung infections.  He denies any immunotherapy related side effects like skin rashes, diarrhea, shortness of breath or dry cough.  Denies any headaches or vision changes.  Has been tolerating Keytruda very well.   REVIEW OF SYSTEMS:  Review of Systems  Constitutional:  Negative for unexpected weight change.  All other systems reviewed and are negative.   PAST MEDICAL/SURGICAL HISTORY:  Past Medical History:  Diagnosis Date   Cancer ([place])    lung cancer    Past Surgical History:  Procedure Laterality Date   BACK SURGERY      SOCIAL HISTORY:  Social History   Socioeconomic History   Marital  status: Single    Spouse name: Not on file   Number of children: Not on file   Years of education: Not on file   Highest education level: Not on file  Occupational History   Not on file  Tobacco Use   Smoking status: Former    Types: Cigarettes    Quit date: 12/01/2019    Years since quitting: 2.5   Smokeless tobacco: Never  Substance and Sexual Activity   Alcohol use: Never   Drug use: Never   Sexual activity: Not Currently  Other Topics Concern   Not on file  Social History Narrative   Not on file   Social Determinants of Health   Financial Resource Strain: Low Risk  (12/06/2020)   Overall Financial Resource Strain (CARDIA)    Difficulty of Paying Living Expenses: Not hard at all  Food Insecurity: No Food Insecurity (12/06/2020)   Hunger Vital Sign    Worried About Running Out of Food in the Last Year: Never true    [place] in the Last Year: Never true  Transportation Needs: No Transportation Needs (12/06/2020)   PRAPARE - Hydrologist (Medical): No    Lack of Transportation (Non-Medical): No  Physical Activity: Sufficiently Active (12/06/2020)   Exercise Vital Sign    Days of Exercise per Week: 7 days    Minutes of Exercise per Session: 30 min  Stress: No Stress Concern Present (12/06/2020)   [employer]  of Occupational Health - Occupational Stress Questionnaire    Feeling of Stress : Only a little  Social Connections: Moderately Integrated (12/06/2020)   Social Connection and Isolation Panel [NHANES]    Frequency of Communication with Friends and Family: More than three times a week    Frequency of Social Gatherings with Friends and Family: More than three times a week    Attends Religious Services: 1 to 4 times per year    Active Member of [employer] or Organizations: No    Attends Archivist Meetings: 1 to 4 times per year    Marital Status: Divorced  Human resources officer Violence: Not At Risk (12/06/2020)   Humiliation, Afraid,  Rape, and Kick questionnaire    Fear of Current or Ex-Partner: No    Emotionally Abused: No    Physically Abused: No    Sexually Abused: No    FAMILY HISTORY:  No family history on file.  CURRENT MEDICATIONS:  Current Outpatient Medications  Medication Sig Dispense Refill   albuterol (VENTOLIN HFA) 108 (90 Base) MCG/ACT inhaler INHALE 2 PUFFS INTO THE LUNGS EVERY 4 HOURS AS NEEDED FOR WHEEZING OR SHORTNESS OF BREATH 18 g 1   fluticasone furoate-vilanterol (BREO ELLIPTA) 200-25 MCG/ACT AEPB INHALE 1 PUFF INTO THE LUNGS EVERY MORNING 60 each 11   No current facility-administered medications for this visit.    ALLERGIES:  No Known Allergies  PHYSICAL EXAM:  Performance status (ECOG): 1 - Symptomatic but completely ambulatory  There were no vitals filed for this visit.  Wt Readings from Last 3 Encounters:  05/17/22 147 lb (66.7 kg)  04/05/22 149 lb (67.6 kg)  02/22/22 149 lb 8 oz (67.8 kg)   Physical Exam Vitals reviewed.  Constitutional:      Appearance: Normal appearance.  Cardiovascular:     Rate and Rhythm: Normal rate and regular rhythm.     Pulses: Normal pulses.     Heart sounds: Normal heart sounds.  Pulmonary:     Effort: Pulmonary effort is normal.     Breath sounds: Normal breath sounds.  Neurological:     General: No focal deficit present.     Mental Status: He is alert and oriented to person, place, and time.  Psychiatric:        Mood and Affect: Mood normal.        Behavior: Behavior normal.    LABORATORY DATA:  I have reviewed the labs as listed.     Latest Ref Rng & Units 05/17/2022   11:57 AM 04/05/2022   11:48 AM 02/22/2022   11:41 AM  CBC  WBC 4.0 - 10.5 K/uL 6.5  6.7  6.4   Hemoglobin 13.0 - 17.0 g/dL 13.4  13.1  12.9   Hematocrit 39.0 - 52.0 % 39.3  39.4  39.4   Platelets 150 - 400 K/uL 224  229  231       Latest Ref Rng & Units 05/17/2022   11:57 AM 04/05/2022   11:48 AM 02/22/2022   11:41 AM  CMP  Glucose 70 - 99 mg/dL 86  100  110    BUN 8 - 23 mg/dL 16  10  10    Creatinine 0.61 - 1.24 mg/dL 1.00  0.72  0.76   Sodium 135 - 145 mmol/L 135  134  132   Potassium 3.5 - 5.1 mmol/L 4.3  4.2  4.2   Chloride 98 - 111 mmol/L 105  102  101   CO2 22 - 32 mmol/L 25  27  26   Calcium 8.9 - 10.3 mg/dL 8.9  8.8  8.7   Total Protein 6.5 - 8.1 g/dL 6.8  6.7  6.6   Total Bilirubin 0.3 - 1.2 mg/dL 0.6  0.5  0.5   Alkaline Phos 38 - 126 U/L 70  72  70   AST 15 - 41 U/L 19  19  22    ALT 0 - 44 U/L 16  16  17      DIAGNOSTIC IMAGING:  I have independently reviewed the scans and discussed with the patient. CT Chest W Contrast  Result Date: 06/22/2022 CLINICAL DATA:  Stage IV lung adenocarcinoma.  Restaging. * Tracking Code: BO * EXAM: CT CHEST WITH CONTRAST TECHNIQUE: Multidetector CT imaging of the chest was performed during intravenous contrast administration. RADIATION DOSE REDUCTION: This exam was performed according to the departmental dose-optimization program which includes automated exposure control, adjustment of the mA and/or kV according to patient size and/or use of iterative reconstruction technique. CONTRAST:  78m OMNIPAQUE IOHEXOL 300 MG/ML  SOLN COMPARISON:  02/15/2022 chest CT. FINDINGS: Cardiovascular: Normal heart size. No significant pericardial effusion/thickening. Three-vessel coronary atherosclerosis. Atherosclerotic nonaneurysmal thoracic aorta. Normal caliber pulmonary arteries. No central pulmonary emboli. Mediastinum/Nodes: No significant thyroid nodules. Unremarkable esophagus. No right axillary adenopathy. Mildly enlarged 1.0 cm left retropectoral node (series 2/image 31), previously 1.0 cm using similar measurement technique, stable. Multiple mildly enlarged left axillary lymph nodes, largest 1.3 cm (series 2/image 36), previously 1.3 cm, stable. No pathologically enlarged mediastinal or hilar lymph nodes. Lungs/Pleura: No pneumothorax. No pleural effusion. Marked centrilobular emphysema with diffuse bronchial wall  thickening. Spiculated solid 3.4 x 2.2 cm right upper lobe lung mass (series 4/image 44), previously 3.4 x 2.1 cm using similar measurement technique, stable. Anterior right middle lobe solid 0.3 cm pulmonary nodule (series 4/image 115), stable. Irregular solid 1.4 x 1.2 cm posteromedial left lower lobe pulmonary nodule (series 4/image 63), previously 1.2 x 1.0 cm, slightly increased. Left upper lobe 0.4 cm solid pulmonary nodule (series 4/image 27), stable. No acute consolidative airspace disease or additional significant pulmonary nodules. Upper abdomen: No acute abnormality. Musculoskeletal: No aggressive appearing focal osseous lesions. Mild thoracic spondylosis. IMPRESSION: 1. Irregular solid 1.4 cm posteromedial left lower lobe pulmonary nodule, slightly increased, suspicious for malignant nodule, either a metachronous primary bronchogenic malignancy versus a contralateral pulmonary metastasis. 2. Otherwise stable chest CT. Spiculated solid 3.4 cm right upper lobe lung mass is stable. Additional tiny bilateral pulmonary nodules are stable. 3. Mild left axillary and left retropectoral adenopathy is stable, cannot exclude metastatic adenopathy. 4. Three-vessel coronary atherosclerosis. 5. Aortic Atherosclerosis (ICD10-I70.0) and Emphysema (ICD10-J43.9). Electronically Signed   By: JIlona SorrelM.D.   On: 06/22/2022 10:14     ASSESSMENT:  1.  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 -Mutations were negative for EGFR, ALK, ROS1, RET, BRAF V600 -PD-L1 22 C3 greater than 95%. -He was started on single agent pembrolizumab every 3 weeks under the direction of Dr. RDola Factorin [place] 2020, later switched to every 6 weeks. -We will consider testing for K-ras G 12 C, NTR K and met exon 14 mutations upon progression.   2.  Social/family history: -He is a  retired tAdministrator  Quit smoking in August 2020. -Father had prostate cancer.   PLAN:  1.  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 - CT chest (06/21/2022): Irregular solid 1.4 cm left lower lobe lung nodule, slightly increased, suspicious for malignancy.  This has been steadily increasing for the last 1 year.  Otherwise stable chest CT.  Spiculated solid 3.4 cm right upper lobe mass is stable.  Mild left axillary and left retroperitoneal adenopathy stable. - Reviewed labs today which showed normal LFTs and CBC.  TSH was 1.64. - Proceed with Keytruda treatment today. - As the left lung nodule is suspicious, I have recommended PET CT scan prior to next visit in 6 weeks.  If it lights up on the PET scan, will consider SBRT of the lesion and continue Keytruda.   2.  Brain metastasis: - He did not receive prior brain RT. - Last brain MRI was on 11/22/2021 which did not show any evidence of metastatic disease in the brain.  Redemonstrated sclerotic focus in the C3 vertebral body unchanged. - We will consider repeat imaging in 1 year from the last.   3.  COPD: - Can continue Breo Ellipta once daily and Ventolin as needed.   Orders placed this encounter:  No orders of the defined types were placed in this encounter.    [name], MD [place] [phone]

## 2022-06-28 NOTE — Progress Notes (Signed)
Patient has been assessed, vital signs and labs have been reviewed by Dr. Katragadda. ANC, Creatinine, LFTs, and Platelets are within treatment parameters per Dr. Katragadda. The patient is good to proceed with treatment at this time. Primary RN and pharmacy aware.  

## 2022-06-28 NOTE — Patient Instructions (Addendum)
[facility]  Discharge Instructions  You were seen and examined today by Dr. Delton Coombes.  Dr. Delton Coombes has reviewed your most recent CT scan, which showed questionable progression. Dr. Delton Coombes has recommended a PET scan to assess further. If it is a true progression, Dr. Delton Coombes will discuss the potential need for radiation therapy after the PET scan.  Follow-up as scheduled.  Thank you for choosing [facility] to provide your oncology and hematology care.   To afford each patient quality time with our provider, please arrive at least 15 minutes before your scheduled appointment time. You may need to reschedule your appointment if you arrive late (10 or more minutes). Arriving late affects you and other patients whose appointments are after yours.  Also, if you miss three or more appointments without notifying the office, you may be dismissed from the clinic at the provider's discretion.    Again, thank you for choosing [facility].  Our hope is that these requests will decrease the amount of time that you wait before being seen by our physicians.   If you have a lab appointment with the [facility] please come in thru the Main Entrance and check in at the main information desk.           _____________________________________________________________  Should you have questions after your visit to [facility], please contact our office at [phone] and follow the prompts.  Our office hours are 8:00 a.m. to 4:30 p.m. Monday - Thursday and 8:00 a.m. to 2:30 p.m. Friday.  Please note that voicemails left after 4:00 p.m. may not be returned until the following business day.  We are closed weekends and all major holidays.  You do have access to a nurse 24-7, just call the main number to the clinic [phone] and do not press any options, hold on the line and a nurse will answer the phone.    For  prescription refill requests, have your pharmacy contact our office and allow 72 hours.    Masks are optional in the cancer centers. If you would like for your care team to wear a mask while they are taking care of you, please let them know. You may have one support person who is at least 66 years old accompany you for your appointments.

## 2022-06-28 NOTE — Progress Notes (Signed)
Patient presents today for Keytruda infusion per providers order.  Vital signs and labs within parameters for treatment.  Patient has no new complaints at this time.    Peripheral IV started and blood return noted pre and post infusion.  Treatment given today per MD orders.  Stable during infusion without adverse affects.  Vital signs stable.  No complaints at this time.  Discharge from clinic ambulatory in stable condition.  Alert and oriented X 3.  Follow up with [facility] as scheduled.

## 2022-06-29 ENCOUNTER — Other Ambulatory Visit: Payer: Self-pay

## 2022-06-30 LAB — CBC WITH DIFFERENTIAL/PLATELET
Abs Immature Granulocytes: 0.03 10*3/uL (ref 0.00–0.07)
Basophils Absolute: 0.1 10*3/uL (ref 0.0–0.1)
Basophils Relative: 1 %
Eosinophils Absolute: 0.3 10*3/uL (ref 0.0–0.5)
Eosinophils Relative: 3 %
HCT: 41.4 % (ref 39.0–52.0)
Hemoglobin: 13.8 g/dL (ref 13.0–17.0)
Immature Granulocytes: 0 %
Lymphocytes Relative: 17 %
Lymphs Abs: 1.3 10*3/uL (ref 0.7–4.0)
MCH: 31.9 pg (ref 26.0–34.0)
MCHC: 33.3 g/dL (ref 30.0–36.0)
MCV: 95.6 fL (ref 80.0–100.0)
Monocytes Absolute: 0.7 10*3/uL (ref 0.1–1.0)
Monocytes Relative: 9 %
Neutro Abs: 5.6 10*3/uL (ref 1.7–7.7)
Neutrophils Relative %: 70 %
Platelets: 289 10*3/uL (ref 150–400)
RBC: 4.33 MIL/uL (ref 4.22–5.81)
RDW: 12.9 % (ref 11.5–15.5)
WBC: 8 10*3/uL (ref 4.0–10.5)
nRBC: 0 % (ref 0.0–0.2)

## 2022-07-03 ENCOUNTER — Other Ambulatory Visit: Payer: Self-pay

## 2022-07-07 ENCOUNTER — Other Ambulatory Visit: Payer: Self-pay

## 2022-07-16 ENCOUNTER — Other Ambulatory Visit (HOSPITAL_COMMUNITY): Payer: Self-pay | Admitting: Physician Assistant

## 2022-07-19 ENCOUNTER — Other Ambulatory Visit: Payer: Self-pay

## 2022-07-19 MED ORDER — ALBUTEROL SULFATE HFA 108 (90 BASE) MCG/ACT IN AERS: 2.0000 | INHALATION_SPRAY | RESPIRATORY_TRACT | 1 refills | Status: DC | PRN

## 2022-07-26 ENCOUNTER — Other Ambulatory Visit: Payer: Self-pay

## 2022-08-03 ENCOUNTER — Encounter (HOSPITAL_COMMUNITY)
Admission: RE | Admit: 2022-08-03 | Discharge: 2022-08-03 | Disposition: A | Discharge status: Home / Self Care | Payer: Medicare Other | Source: Ambulatory Visit | Attending: Hematology | Admitting: Hematology

## 2022-08-03 DIAGNOSIS — C3411 Malignant neoplasm of upper lobe, right bronchus or lung: Secondary | ICD-10-CM | POA: Diagnosis not present

## 2022-08-03 DIAGNOSIS — C3491 Malignant neoplasm of unspecified part of right bronchus or lung: Secondary | ICD-10-CM | POA: Diagnosis present

## 2022-08-03 MED ORDER — FLUDEOXYGLUCOSE F - 18 (FDG) INJECTION
7.8200 | Freq: Once | INTRAVENOUS | Status: AC | PRN
  Administered 2022-08-03: 7.82 via INTRAVENOUS

## 2022-08-09 ENCOUNTER — Inpatient Hospital Stay: Payer: Medicare Other

## 2022-08-09 ENCOUNTER — Inpatient Hospital Stay: Payer: Medicare Other | Attending: Hematology | Admitting: Hematology

## 2022-08-09 VITALS — BP 128/79 | HR 72 | Temp 96.8°F | Resp 16

## 2022-08-09 VITALS — BP 134/85 | HR 71 | Temp 98.2°F | Resp 16 | Ht 68.5 in | Wt 147.1 lb

## 2022-08-09 DIAGNOSIS — Z79899 Other long term (current) drug therapy: Secondary | ICD-10-CM | POA: Diagnosis not present

## 2022-08-09 DIAGNOSIS — J449 Chronic obstructive pulmonary disease, unspecified: Secondary | ICD-10-CM | POA: Insufficient documentation

## 2022-08-09 DIAGNOSIS — Z5112 Encounter for antineoplastic immunotherapy: Secondary | ICD-10-CM | POA: Diagnosis present

## 2022-08-09 DIAGNOSIS — C3491 Malignant neoplasm of unspecified part of right bronchus or lung: Secondary | ICD-10-CM

## 2022-08-09 DIAGNOSIS — C7931 Secondary malignant neoplasm of brain: Secondary | ICD-10-CM | POA: Insufficient documentation

## 2022-08-09 LAB — CBC WITH DIFFERENTIAL/PLATELET
Abs Immature Granulocytes: 0.01 10*3/uL (ref 0.00–0.07)
Basophils Absolute: 0.1 10*3/uL (ref 0.0–0.1)
Basophils Relative: 1 %
Eosinophils Absolute: 0.5 10*3/uL (ref 0.0–0.5)
Eosinophils Relative: 8 %
HCT: 38.7 % — ABNORMAL LOW (ref 39.0–52.0)
Hemoglobin: 13.1 g/dL (ref 13.0–17.0)
Immature Granulocytes: 0 %
Lymphocytes Relative: 19 %
Lymphs Abs: 1.1 10*3/uL (ref 0.7–4.0)
MCH: 31.6 pg (ref 26.0–34.0)
MCHC: 33.9 g/dL (ref 30.0–36.0)
MCV: 93.3 fL (ref 80.0–100.0)
Monocytes Absolute: 0.5 10*3/uL (ref 0.1–1.0)
Monocytes Relative: 9 %
Neutro Abs: 3.5 10*3/uL (ref 1.7–7.7)
Neutrophils Relative %: 63 %
Platelets: 241 10*3/uL (ref 150–400)
RBC: 4.15 MIL/uL — ABNORMAL LOW (ref 4.22–5.81)
RDW: 12.2 % (ref 11.5–15.5)
WBC: 5.6 10*3/uL (ref 4.0–10.5)
nRBC: 0 % (ref 0.0–0.2)

## 2022-08-09 LAB — COMPREHENSIVE METABOLIC PANEL
ALT: 17 U/L (ref 0–44)
AST: 20 U/L (ref 15–41)
Albumin: 3.9 g/dL (ref 3.5–5.0)
Alkaline Phosphatase: 68 U/L (ref 38–126)
Anion gap: 9 (ref 5–15)
BUN: 17 mg/dL (ref 8–23)
CO2: 24 mmol/L (ref 22–32)
Calcium: 8.8 mg/dL — ABNORMAL LOW (ref 8.9–10.3)
Chloride: 98 mmol/L (ref 98–111)
Creatinine, Ser: 0.73 mg/dL (ref 0.61–1.24)
GFR, Estimated: >60 mL/min (ref >60)
Glucose, Bld: 111 mg/dL — ABNORMAL HIGH (ref 70–99)
Potassium: 3.8 mmol/L (ref 3.5–5.1)
Sodium: 131 mmol/L — ABNORMAL LOW (ref 135–145)
Total Bilirubin: 0.5 mg/dL (ref 0.3–1.2)
Total Protein: 6.7 g/dL (ref 6.5–8.1)

## 2022-08-09 LAB — TSH: TSH: 1.085 u[IU]/mL (ref 0.350–4.500)

## 2022-08-09 LAB — MAGNESIUM: Magnesium: 2.1 mg/dL (ref 1.7–2.4)

## 2022-08-09 MED ORDER — SODIUM CHLORIDE 0.9 % IV SOLN
400.0000 mg | Freq: Once | INTRAVENOUS | Status: AC
  Administered 2022-08-09: 400 mg via INTRAVENOUS
  Filled 2022-08-09: qty 16

## 2022-08-09 MED ORDER — SODIUM CHLORIDE 0.9 % IV SOLN: Freq: Once | INTRAVENOUS | Status: AC

## 2022-08-09 NOTE — Progress Notes (Signed)
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  PRIOR THERAPY: none  NGS Results: PD-L1 TPS >95%; EGFR, ALK, ROS1, RET, BRAF V600E negative  CURRENT THERAPY: Keytruda every 6 weeks  BRIEF ONCOLOGIC HISTORY:  Oncology History  Adenocarcinoma of lung, stage 4, right ([place])  12/08/2020 Initial Diagnosis   Adenocarcinoma of lung, stage 4, right ([place])   12/08/2020 Cancer Staging   Staging form: Lung, AJCC 8th Edition - Clinical stage from 12/08/2020: Stage IVB (cT2b, cN3, pM1c) - Signed by [name], MD on 12/08/2020 Histopathologic type: Adenocarcinoma, NOS   12/29/2020 - 05/17/2022 Chemotherapy   Patient is on Treatment Plan : LUNG Pembrolizumab q42d     12/29/2020 -  Chemotherapy   Patient is on Treatment Plan : LUNG Pembrolizumab (400) q42d       CANCER STAGING:  Cancer Staging  Adenocarcinoma of lung, stage 4, right ([place]) Staging form: Lung, AJCC 8th Edition - Clinical stage from 12/08/2020: Stage IVB (cT2b, cN3, pM1c) - Signed by [name], MD on 12/08/2020   INTERVAL HISTORY:  [name], a 66 y.o. male, seen for follow-up of lung cancer.  He is here for next cycle of Keytruda and toxicity assessment.  Denies any immunotherapy related side effects.  No severe fatigue reported.  No cough or chest pains reported.   REVIEW OF SYSTEMS:  Review of Systems  Constitutional:  Negative for unexpected weight change.  All other systems reviewed and are negative.   PAST MEDICAL/SURGICAL HISTORY:  Past Medical History:  Diagnosis Date   Cancer ([place])    lung cancer    Past Surgical History:  Procedure Laterality Date   BACK SURGERY      SOCIAL HISTORY:  Social History   Socioeconomic History   Marital status: Single    Spouse name: Not on file   Number of children: Not on file    Years of education: Not on file   Highest education level: Not on file  Occupational History   Not on file  Tobacco Use   Smoking status: Former    Types: Cigarettes    Quit date: 12/01/2019    Years since quitting: 2.6   Smokeless tobacco: Never  Substance and Sexual Activity   Alcohol use: Never   Drug use: Never   Sexual activity: Not Currently  Other Topics Concern   Not on file  Social History Narrative   Not on file   Social Determinants of Health   Financial Resource Strain: Low Risk  (12/06/2020)   Overall Financial Resource Strain (CARDIA)    Difficulty of Paying Living Expenses: Not hard at all  Food Insecurity: No Food Insecurity (12/06/2020)   Hunger Vital Sign    Worried About Running Out of Food in the Last Year: Never true    [place] in the Last Year: Never true  Transportation Needs: No Transportation Needs (12/06/2020)   PRAPARE - Hydrologist (Medical): No    Lack of Transportation (Non-Medical): No  Physical Activity: Sufficiently Active (12/06/2020)   Exercise Vital Sign    Days of Exercise per Week: 7 days    Minutes of Exercise per Session: 30 min  Stress: No Stress Concern Present (12/06/2020)   [place]  Feeling of Stress : Only a little  Social Connections: Moderately Integrated (12/06/2020)   Social Connection and Isolation Panel [NHANES]    Frequency of Communication with Friends and Family: More than three times a week    Frequency of Social Gatherings with Friends and Family: More than three times a week    Attends Religious Services: 1 to 4 times per year    Active Member of [employer] or Organizations: No    Attends Archivist Meetings: 1 to 4 times per year    Marital Status: Divorced  Human resources officer Violence: Not At Risk (12/06/2020)   Humiliation, Afraid, Rape, and Kick questionnaire    Fear of Current or Ex-Partner: No    Emotionally  Abused: No    Physically Abused: No    Sexually Abused: No    FAMILY HISTORY:  No family history on file.  CURRENT MEDICATIONS:  Current Outpatient Medications  Medication Sig Dispense Refill   albuterol (VENTOLIN HFA) 108 (90 Base) MCG/ACT inhaler INHALE 2 PUFFS INTO THE LUNGS EVERY 4 HOURS AS NEEDED FOR WHEEZING OR SHORTNESS OF BREATH 18 g 1   albuterol (VENTOLIN HFA) 108 (90 Base) MCG/ACT inhaler Inhale 2 puffs into the lungs every 4 (four) hours as needed for wheezing or shortness of breath. 18 g 1   fluticasone furoate-vilanterol (BREO ELLIPTA) 200-25 MCG/ACT AEPB INHALE 1 PUFF INTO THE LUNGS EVERY MORNING 60 each 11   No current facility-administered medications for this visit.    ALLERGIES:  No Known Allergies  PHYSICAL EXAM:  Performance status (ECOG): 1 - Symptomatic but completely ambulatory  Vitals:   08/09/22 1318  BP: 134/85  Pulse: 71  Resp: 16  Temp: 98.2 F (36.8 C)  SpO2: 98%    Wt Readings from Last 3 Encounters:  08/09/22 147 lb 1.6 oz (66.7 kg)  06/28/22 145 lb 3.2 oz (65.9 kg)  05/17/22 147 lb (66.7 kg)   Physical Exam Vitals reviewed.  Constitutional:      Appearance: Normal appearance.  Cardiovascular:     Rate and Rhythm: Normal rate and regular rhythm.     Pulses: Normal pulses.     Heart sounds: Normal heart sounds.  Pulmonary:     Effort: Pulmonary effort is normal.     Breath sounds: Normal breath sounds.  Neurological:     General: No focal deficit present.     Mental Status: He is alert and oriented to person, place, and time.  Psychiatric:        Mood and Affect: Mood normal.        Behavior: Behavior normal.     LABORATORY DATA:  I have reviewed the labs as listed.     Latest Ref Rng & Units 08/09/2022   12:18 PM 06/28/2022   12:52 PM 05/17/2022   11:57 AM  CBC  WBC 4.0 - 10.5 K/uL 5.6  8.0  6.5   Hemoglobin 13.0 - 17.0 g/dL 13.1  13.8  13.4   Hematocrit 39.0 - 52.0 % 38.7  41.4  39.3   Platelets 150 - 400 K/uL 241   289  224       Latest Ref Rng & Units 08/09/2022   12:18 PM 06/28/2022   12:52 PM 05/17/2022   11:57 AM  CMP  Glucose 70 - 99 mg/dL 111  82  86   BUN 8 - 23 mg/dL _0 Creatinine 0.61 - 1.24 mg/dL 0.73  0.70  1.00   Sodium  135 - 145 mmol/L 131  132  135   Potassium 3.5 - 5.1 mmol/L 3.8  4.4  4.3   Chloride 98 - 111 mmol/L 98  96  105   CO2 22 - 32 mmol/L _0 Calcium 8.9 - 10.3 mg/dL 8.8  9.0  8.9   Total Protein 6.5 - 8.1 g/dL 6.7  7.3  6.8   Total Bilirubin 0.3 - 1.2 mg/dL 0.5  0.8  0.6   Alkaline Phos 38 - 126 U/L 68  77  70   AST 15 - 41 U/L _1 ALT 0 - 44 U/L _2 DIAGNOSTIC IMAGING:  I have independently reviewed the scans and discussed with the patient. NM PET Image Restag (PS) Skull Base To Thigh  Result Date: 08/07/2022 CLINICAL DATA:  Subsequent treatment strategy for stage IV right lung adenocarcinoma on immunotherapy. EXAM: NUCLEAR MEDICINE PET SKULL BASE TO THIGH TECHNIQUE: 7.8 mCi F-18 FDG was injected intravenously. Full-ring PET imaging was performed from the skull base to thigh after the radiotracer. CT data was obtained and used for attenuation correction and anatomic localization. Fasting blood glucose: 96 mg/dl COMPARISON:  06/21/2022 a chest CT. 12/24/2020 CT chest, abdomen and pelvis. FINDINGS: Mediastinal blood pool activity: SUV max 2.1 Liver activity: SUV max NA NECK: Hypermetabolic 0.9 cm left level II neck lymph node with max SUV 4.5 (series 3/image 51). Otherwise no hypermetabolic or enlarged neck lymph nodes. Incidental CT findings: None. CHEST: Hypermetabolic spiculated solid 3.3 x 2.3 cm right upper lobe lung mass with max SUV 12.0 (series 7/image 28). Solid irregular 1.3 cm medial left lower lobe pulmonary nodule with low level FDG uptake with max SUV 1.8 (series 7/image 43). Hypermetabolic enlarged 1.2 cm left retropectoral lymph node with max SUV 5.6 (series 3/image 76). Enlarged hypermetabolic left axillary lymph nodes,  largest 1.6 cm with max SUV 7.4 (series 3/image 88). No enlarged or hypermetabolic right axillary, mediastinal or hilar lymph nodes. Incidental CT findings: Severe centrilobular emphysema. Two additional tiny scattered solid pulmonary nodules, largest 0.5 cm in the apical left upper lobe (series 7/image 18), below PET resolution and without significant FDG uptake. Atherosclerotic nonaneurysmal thoracic aorta. Coronary atherosclerosis. ABDOMEN/PELVIS: No abnormal hypermetabolic activity within the liver, pancreas, adrenal glands, or spleen. No hypermetabolic lymph nodes in the abdomen or pelvis. Incidental CT findings: Atherosclerotic nonaneurysmal abdominal aorta. SKELETON: No focal hypermetabolic activity to suggest skeletal metastasis. Incidental CT findings: None. IMPRESSION: 1. Hypermetabolic spiculated solid 3.3 cm right upper lobe lung mass, compatible with viable primary bronchogenic carcinoma. 2. Solid irregular 1.3 cm medial left lower lobe pulmonary nodule with low level FDG uptake, suspicious for contralateral pulmonary metastasis. 3. Hypermetabolic left retropectoral and left axillary nodal metastases. 4. Solitary hypermetabolic left level II neck lymph node, compatible with nodal metastasis. 5. No hypermetabolic metastatic disease in the abdomen, pelvis or skeleton. 6. Aortic Atherosclerosis (ICD10-I70.0) and Emphysema (ICD10-J43.9). Electronically Signed   By: Ilona Sorrel M.D.   On: 08/07/2022 08:42     ASSESSMENT:  1.  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 -Mutations were negative for EGFR, ALK, ROS1, RET, BRAF V600 -PD-L1 22 C3 greater than 95%. -He was started on single agent pembrolizumab every 3 weeks under the direction of Dr. Dola Factor in [place] in August 2020, later  switched to every 6 weeks. -We will consider testing for K-ras G 12 C, NTR  K and met exon 14 mutations upon progression.   2.  Social/family history: -He is a retired Administrator.  Quit smoking in August 2020. -Father had prostate cancer.   PLAN:  1.  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  This has been steadily increasing for the last 1 year.  Stable spiculated solid 3.4 cm right upper lobe mass.  Mild left axillary and left retrocrural adenopathy is stable. - PET scan (08/03/2022): Hypermetabolic spiculated 3.3 cm right upper lobe mass, SUV 12.  Solid irregular 1.3 cm medial left lower lobe nodule with SUV 1.8.  Hypermetabolic left retrocrural and left axillary lymph nodes.  Solitary left level 2 neck lymph node. - Even though the right upper lobe lung mass is larger and has high SUV value, it has been more or less stable since March 2022. - I have recommended biopsy of the left axillary lymph node.  If it is malignant, we will send NGS panel on it. - Reviewed labs today which shows normal LFTs and CBC. - Proceed with Keytruda today.  RTC 6 weeks for follow-up.   2.  Brain metastasis: - He did not receive prior brain RT. - Last brain MRI was on 11/22/2021 which did not show any evidence of metastatic disease.  There is sclerotic focus in the C3 vertebral body stable. - We will plan to repeat MRI in 1 year from last.   3.  COPD: - Continue Breo Ellipta once daily and Ventolin as needed.   Orders placed this encounter:  Orders Placed This Encounter  Procedures   [place] AXILLARY NODE CORE BIOPSY LEFT      [name], [place] [phone]

## 2022-08-09 NOTE — Patient Instructions (Signed)
[facility]  Discharge Instructions: Thank you for choosing [facility] to provide your oncology and hematology care.  If you have a lab appointment with the [facility], please come in thru the Main Entrance and check in at the main information desk.  Wear comfortable clothing and clothing appropriate for easy access to any Portacath or PICC line.   We strive to give you quality time with your provider. You may need to reschedule your appointment if you arrive late (15 or more minutes).  Arriving late affects you and other patients whose appointments are after yours.  Also, if you miss three or more appointments without notifying the office, you may be dismissed from the clinic at the provider's discretion.      For prescription refill requests, have your pharmacy contact our office and allow 72 hours for refills to be completed.    Today you received the following chemotherapy and/or immunotherapy agents Keytruda.  Pembrolizumab Injection What is this medication? PEMBROLIZUMAB (PEM broe LIZ ue mab) treats some types of cancer. It works by helping your immune system slow or stop the spread of cancer cells. It is a monoclonal antibody. This medicine may be used for other purposes; ask your health care provider or pharmacist if you have questions. COMMON BRAND NAME(S): Keytruda What should I tell my care team before I take this medication? They need to know if you have any of these conditions: Allogeneic stem cell transplant (uses someone else's stem cells) Autoimmune diseases, such as Crohn disease, ulcerative colitis, lupus History of chest radiation Nervous system problems, such as Guillain-Barre syndrome, myasthenia gravis Organ transplant An unusual or allergic reaction to pembrolizumab, other medications, foods, dyes, or preservatives Pregnant or trying to get pregnant Breast-feeding How should I use this medication? This medication is injected  into a vein. It is given by your care team in a hospital or clinic setting. A special MedGuide will be given to you before each treatment. Be sure to read this information carefully each time. Talk to your care team about the use of this medication in children. While it may be prescribed for children as young as 6 months for selected conditions, precautions do apply. Overdosage: If you think you have taken too much of this medicine contact a poison control center or emergency room at once. NOTE: This medicine is only for you. Do not share this medicine with others. What if I miss a dose? Keep appointments for follow-up doses. It is important not to miss your dose. Call your care team if you are unable to keep an appointment. What may interact with this medication? Interactions have not been studied. This list may not describe all possible interactions. Give your health care provider a list of all the medicines, herbs, non-prescription drugs, or dietary supplements you use. Also tell them if you smoke, drink alcohol, or use illegal drugs. Some items may interact with your medicine. What should I watch for while using this medication? Your condition will be monitored carefully while you are receiving this medication. You may need blood work while taking this medication. This medication may cause serious skin reactions. They can happen weeks to months after starting the medication. Contact your care team right away if you notice fevers or flu-like symptoms with a rash. The rash may be red or purple and then turn into blisters or peeling of the skin. You may also notice a red rash with swelling of the face, lips, or lymph  nodes in your neck or under your arms. Tell your care team right away if you have any change in your eyesight. Talk to your care team if you may be pregnant. Serious birth defects can occur if you take this medication during pregnancy and for 4 months after the last dose. You will need a  negative pregnancy test before starting this medication. Contraception is recommended while taking this medication and for 4 months after the last dose. Your care team can help you find the option that works for you. Do not breastfeed while taking this medication and for 4 months after the last dose. What side effects may I notice from receiving this medication? Side effects that you should report to your care team as soon as possible: Allergic reactions--skin rash, itching, hives, swelling of the face, lips, tongue, or throat Dry cough, shortness of breath or trouble breathing Eye pain, redness, irritation, or discharge with blurry or decreased vision Heart muscle inflammation--unusual weakness or fatigue, shortness of breath, chest pain, fast or irregular heartbeat, dizziness, swelling of the ankles, feet, or hands Hormone gland problems--headache, sensitivity to light, unusual weakness or fatigue, dizziness, fast or irregular heartbeat, increased sensitivity to cold or heat, excessive sweating, constipation, hair loss, increased thirst or amount of urine, tremors or shaking, irritability Infusion reactions--chest pain, shortness of breath or trouble breathing, feeling faint or lightheaded Kidney injury (glomerulonephritis)--decrease in the amount of urine, red or dark Calbert Hulsebus urine, foamy or bubbly urine, swelling of the ankles, hands, or feet Liver injury--right upper belly pain, loss of appetite, nausea, light-colored stool, dark yellow or Pamala Hayman urine, yellowing skin or eyes, unusual weakness or fatigue Pain, tingling, or numbness in the hands or feet, muscle weakness, change in vision, confusion or trouble speaking, loss of balance or coordination, trouble walking, seizures Rash, fever, and swollen lymph nodes Redness, blistering, peeling, or loosening of the skin, including inside the mouth Sudden or severe stomach pain, bloody diarrhea, fever, nausea, vomiting Side effects that usually do not  require medical attention (report to your care team if they continue or are bothersome): Bone, joint, or muscle pain Diarrhea Fatigue Loss of appetite Nausea Skin rash This list may not describe all possible side effects. Call your doctor for medical advice about side effects. You may report side effects to FDA at 1-800-FDA-1088. Where should I keep my medication? This medication is given in a hospital or clinic. It will not be stored at home. NOTE: This sheet is a summary. It may not cover all possible information. If you have questions about this medicine, talk to your doctor, pharmacist, or health care provider.  2023 Elsevier/Gold Standard (2013-06-09 00:00:00)        To help prevent nausea and vomiting after your treatment, we encourage you to take your nausea medication as directed.  BELOW ARE SYMPTOMS THAT SHOULD BE REPORTED IMMEDIATELY: *FEVER GREATER THAN 100.4 F (38 C) OR HIGHER *CHILLS OR SWEATING *NAUSEA AND VOMITING THAT IS NOT CONTROLLED WITH YOUR NAUSEA MEDICATION *UNUSUAL SHORTNESS OF BREATH *UNUSUAL BRUISING OR BLEEDING *URINARY PROBLEMS (pain or burning when urinating, or frequent urination) *BOWEL PROBLEMS (unusual diarrhea, constipation, pain near the anus) TENDERNESS IN MOUTH AND THROAT WITH OR WITHOUT PRESENCE OF ULCERS (sore throat, sores in mouth, or a toothache) UNUSUAL RASH, SWELLING OR PAIN  UNUSUAL VAGINAL DISCHARGE OR ITCHING   Items with * indicate a potential emergency and should be followed up as soon as possible or go to the Emergency Department if any problems should occur.  Please show the CHEMOTHERAPY ALERT CARD or IMMUNOTHERAPY ALERT CARD at check-in to the Emergency Department and triage nurse.  Should you have questions after your visit or need to cancel or reschedule your appointment, please contact [facility] [phone]  and follow the prompts.  Office hours are 8:00 a.m. to 4:30 p.m. Monday - Friday. Please note  that voicemails left after 4:00 p.m. may not be returned until the following business day.  We are closed weekends and major holidays. You have access to a nurse at all times for urgent questions. Please call the main number to the clinic [phone] and follow the prompts.  For any non-urgent questions, you may also contact your provider using MyChart. We now offer e-Visits for anyone 44 and older to request care online for non-urgent symptoms. For details visit [email or web address].   Also download the MyChart app! Go to the app store, search "MyChart", open the app, select [facility], and log in with your MyChart username and password.  Masks are optional in the cancer centers. If you would like for your care team to wear a mask while they are taking care of you, please let them know. You may have one support person who is at least 66 years old accompany you for your appointments.

## 2022-08-09 NOTE — Progress Notes (Signed)
Patient has been examined by Dr. Katragadda, and vital signs and labs have been reviewed. ANC, Creatinine, LFTs, hemoglobin, and platelets are within treatment parameters per M.D. - pt may proceed with treatment.  Primary RN and pharmacy notified.  

## 2022-08-09 NOTE — Progress Notes (Signed)
Suttle, Antonio Ashing, MD  Donita Brooks D Approved for US guided left axillary lymph node biopsy.    [place]

## 2022-08-09 NOTE — Progress Notes (Signed)
Patient presents today for Keytruda infusion . Patient is in satisfactory condition with no complaint voiced.  Vital signs are stable.  Labs reviewed by Dr. Delton Coombes during his office visit.  All labs are within treatment parameters.  We will proceed with treatment per MD orders.   Patient tolerated treatment well with no complaints voiced.  Patient left ambulatory in stable condition.  Vital signs stable at discharge.  Follow up as scheduled.

## 2022-08-09 NOTE — Patient Instructions (Addendum)
[facility] at [facility] Discharge Instructions   You were seen and examined today by Dr. Delton Coombes.  He reviewed the results of your PET scan, which is showing disease progression in the left lung. There also some lymph nodes under the left arm that Dr. Raliegh Ip would like to biopsy and send off for special testing. This testing will open up additional treatment options.  We will proceed with your treatment today.   Return as scheduled.    Thank you for choosing [facility] at [facility] to provide your oncology and hematology care.  To afford each patient quality time with our provider, please arrive at least 15 minutes before your scheduled appointment time.   If you have a lab appointment with the [facility] please come in thru the Main Entrance and check in at the main information desk.  You need to re-schedule your appointment should you arrive 10 or more minutes late.  We strive to give you quality time with our providers, and arriving late affects you and other patients whose appointments are after yours.  Also, if you no show three or more times for appointments you may be dismissed from the clinic at the providers discretion.     Again, thank you for choosing [facility].  Our hope is that these requests will decrease the amount of time that you wait before being seen by our physicians.       _____________________________________________________________  Should you have questions after your visit to [facility], please contact our office at [phone] and follow the prompts.  Our office hours are 8:00 a.m. and 4:30 p.m. Monday - Friday.  Please note that voicemails left after 4:00 p.m. may not be returned until the following business day.  We are closed weekends and major holidays.  You do have access to a nurse 24-7, just call the main number to the clinic [phone] and do not press any options, hold on the  line and a nurse will answer the phone.    For prescription refill requests, have your pharmacy contact our office and allow 72 hours.    Due to Covid, you will need to wear a mask upon entering the hospital. If you do not have a mask, a mask will be given to you at the Main Entrance upon arrival. For doctor visits, patients may have 1 support person age 39 or older with them. For treatment visits, patients can not have anyone with them due to social distancing guidelines and our immunocompromised population.

## 2022-08-10 MED ORDER — OCTREOTIDE ACETATE 30 MG IM KIT
PACK | INTRAMUSCULAR | Status: AC
  Filled 2022-08-10: qty 1

## 2022-08-30 ENCOUNTER — Other Ambulatory Visit: Payer: Self-pay | Admitting: Radiology

## 2022-08-30 DIAGNOSIS — C3491 Malignant neoplasm of unspecified part of right bronchus or lung: Secondary | ICD-10-CM

## 2022-08-31 ENCOUNTER — Ambulatory Visit (HOSPITAL_COMMUNITY)
Admission: RE | Admit: 2022-08-31 | Discharge: 2022-08-31 | Disposition: A | Discharge status: Home / Self Care | Payer: Medicare Other | Source: Ambulatory Visit

## 2022-08-31 ENCOUNTER — Other Ambulatory Visit: Payer: Self-pay

## 2022-08-31 ENCOUNTER — Ambulatory Visit (HOSPITAL_COMMUNITY)
Admission: RE | Admit: 2022-08-31 | Discharge: 2022-08-31 | Disposition: A | Discharge status: Home / Self Care | Payer: Medicare Other | Source: Ambulatory Visit | Attending: Hematology | Admitting: Hematology

## 2022-08-31 ENCOUNTER — Encounter (HOSPITAL_COMMUNITY): Payer: Self-pay

## 2022-08-31 DIAGNOSIS — C3491 Malignant neoplasm of unspecified part of right bronchus or lung: Secondary | ICD-10-CM | POA: Insufficient documentation

## 2022-08-31 LAB — CBC WITH DIFFERENTIAL/PLATELET
Abs Immature Granulocytes: 0.02 10*3/uL (ref 0.00–0.07)
Basophils Absolute: 0.1 10*3/uL (ref 0.0–0.1)
Basophils Relative: 1 %
Eosinophils Absolute: 0.5 10*3/uL (ref 0.0–0.5)
Eosinophils Relative: 8 %
HCT: 40.1 % (ref 39.0–52.0)
Hemoglobin: 13.4 g/dL (ref 13.0–17.0)
Immature Granulocytes: 0 %
Lymphocytes Relative: 22 %
Lymphs Abs: 1.5 10*3/uL (ref 0.7–4.0)
MCH: 31.9 pg (ref 26.0–34.0)
MCHC: 33.4 g/dL (ref 30.0–36.0)
MCV: 95.5 fL (ref 80.0–100.0)
Monocytes Absolute: 0.7 10*3/uL (ref 0.1–1.0)
Monocytes Relative: 10 %
Neutro Abs: 3.8 10*3/uL (ref 1.7–7.7)
Neutrophils Relative %: 59 %
Platelets: 229 10*3/uL (ref 150–400)
RBC: 4.2 MIL/uL — ABNORMAL LOW (ref 4.22–5.81)
RDW: 12.2 % (ref 11.5–15.5)
WBC: 6.5 10*3/uL (ref 4.0–10.5)
nRBC: 0 % (ref 0.0–0.2)

## 2022-08-31 LAB — BASIC METABOLIC PANEL
Anion gap: 8 (ref 5–15)
BUN: 17 mg/dL (ref 8–23)
CO2: 24 mmol/L (ref 22–32)
Calcium: 8.7 mg/dL — ABNORMAL LOW (ref 8.9–10.3)
Chloride: 103 mmol/L (ref 98–111)
Creatinine, Ser: 0.8 mg/dL (ref 0.61–1.24)
GFR, Estimated: >60 mL/min (ref >60)
Glucose, Bld: 89 mg/dL (ref 70–99)
Potassium: 4.6 mmol/L (ref 3.5–5.1)
Sodium: 135 mmol/L (ref 135–145)

## 2022-08-31 LAB — PROTIME-INR
INR: 1 (ref 0.8–1.2)
Prothrombin Time: 13.3 seconds (ref 11.4–15.2)

## 2022-08-31 MED ORDER — SODIUM CHLORIDE 0.9 % IV SOLN: INTRAVENOUS | Status: DC

## 2022-08-31 MED ORDER — LIDOCAINE HCL 1 % IJ SOLN
INTRAMUSCULAR | Status: AC
  Administered 2022-08-31: 10 mL
  Filled 2022-08-31: qty 20

## 2022-08-31 NOTE — Procedures (Signed)
Vascular and Interventional Radiology Procedure Note  Patient: Antonio Payne DOB: [birth year] Medical Record Number: [id number] Note Date/Time: 08/31/22 1:00 PM   Performing Physician: Michaelle Birks, MD Assistant(s): None  Diagnosis: Met R lung CA   Procedure: LEFT AXILLARY LYMPH BIOPSY  Anesthesia: Local Anesthetic Complications: None Estimated Blood Loss: Minimal Specimens: Sent for Pathology  Findings:  Successful Ultrasound-guided biopsy of L axilla LN. A total of 4 samples were obtained. Hemostasis of the tract was achieved using Manual Pressure.  Plan: Bed rest for 1 hours.  See detailed procedure note with images in PACS. The patient tolerated the procedure well without incident or complication and was returned to Recovery in stable condition.    Michaelle Birks, MD Vascular and Interventional Radiology Specialists [facility] Radiology   Pager. [place]

## 2022-08-31 NOTE — Discharge Instructions (Addendum)
Please call Interventional Radiology clinic [phone] with any questions or concerns.   You may remove your dressing and shower tomorrow.    Needle Biopsy, Care After These instructions tell you how to care for yourself after your procedure. Your doctor may also give you more specific instructions. Call your doctor if Antonio Payne any problems or questions. What can I expect after the procedure? After the procedure, it is common to have: Soreness. Bruising. Mild pain. Follow these instructions at home:  Return to your normal activities as told by your doctor. Ask your doctor what activities are safe for you. Take over-the-counter and prescription medicines only as told by your doctor. Wash your hands with soap and water before you change your bandage (dressing). If you cannot use soap and water, use hand sanitizer. Follow instructions from your doctor about: How to take care of your puncture site. When and how to change your bandage. When to remove your bandage. Check your puncture site every day for signs of infection. Watch for: Redness, swelling, or pain. Fluid or blood. Pus or a bad smell. Warmth. Do not take baths, swim, or use a hot tub until your doctor approves. Ask your doctor if you may take showers. You may only be allowed to take sponge baths. Keep all follow-up visits as told by your doctor. This is important. Contact a doctor if you have: A fever. Redness, swelling, or pain at the puncture site, and it lasts longer than a few days. Fluid, blood, or pus coming from the puncture site. Warmth coming from the puncture site. Get help right away if: You have a lot of bleeding from the puncture site. Summary After the procedure, it is common to have soreness, bruising, or mild pain at the puncture site. Check your puncture site every day for signs of infection, such as redness, swelling, or pain. Get help right away if you have severe bleeding from your puncture  site. This information is not intended to replace advice given to you by your health care provider. Make sure you discuss any questions you have with your healthcare provider. Document Revised: 03/18/2020 Document Reviewed: 03/18/2020 Elsevier Patient Education  [place].

## 2022-09-01 LAB — SURGICAL PATHOLOGY

## 2022-09-05 ENCOUNTER — Other Ambulatory Visit: Payer: Self-pay

## 2022-09-08 ENCOUNTER — Other Ambulatory Visit: Payer: Self-pay

## 2022-09-21 ENCOUNTER — Inpatient Hospital Stay: Payer: Medicare Other

## 2022-09-21 ENCOUNTER — Other Ambulatory Visit: Payer: Self-pay

## 2022-09-21 ENCOUNTER — Inpatient Hospital Stay: Payer: Medicare Other | Attending: Hematology | Admitting: Hematology

## 2022-09-21 VITALS — BP 138/86 | HR 76 | Temp 98.0°F | Resp 17 | Ht 68.5 in | Wt 151.5 lb

## 2022-09-21 DIAGNOSIS — Z79899 Other long term (current) drug therapy: Secondary | ICD-10-CM

## 2022-09-21 DIAGNOSIS — C7931 Secondary malignant neoplasm of brain: Secondary | ICD-10-CM | POA: Insufficient documentation

## 2022-09-21 DIAGNOSIS — Z5112 Encounter for antineoplastic immunotherapy: Secondary | ICD-10-CM | POA: Diagnosis present

## 2022-09-21 DIAGNOSIS — C3491 Malignant neoplasm of unspecified part of right bronchus or lung: Secondary | ICD-10-CM | POA: Insufficient documentation

## 2022-09-21 DIAGNOSIS — J449 Chronic obstructive pulmonary disease, unspecified: Secondary | ICD-10-CM | POA: Diagnosis not present

## 2022-09-21 LAB — COMPREHENSIVE METABOLIC PANEL
ALT: 16 U/L (ref 0–44)
AST: 17 U/L (ref 15–41)
Albumin: 4.1 g/dL (ref 3.5–5.0)
Alkaline Phosphatase: 82 U/L (ref 38–126)
Anion gap: 6 (ref 5–15)
BUN: 20 mg/dL (ref 8–23)
CO2: 27 mmol/L (ref 22–32)
Calcium: 8.7 mg/dL — ABNORMAL LOW (ref 8.9–10.3)
Chloride: 97 mmol/L — ABNORMAL LOW (ref 98–111)
Creatinine, Ser: 0.84 mg/dL (ref 0.61–1.24)
GFR, Estimated: >60 mL/min (ref >60)
Glucose, Bld: 86 mg/dL (ref 70–99)
Potassium: 4.5 mmol/L (ref 3.5–5.1)
Sodium: 130 mmol/L — ABNORMAL LOW (ref 135–145)
Total Bilirubin: 0.5 mg/dL (ref 0.3–1.2)
Total Protein: 6.8 g/dL (ref 6.5–8.1)

## 2022-09-21 LAB — CBC WITH DIFFERENTIAL/PLATELET
Abs Immature Granulocytes: 0.03 10*3/uL (ref 0.00–0.07)
Basophils Absolute: 0.1 10*3/uL (ref 0.0–0.1)
Basophils Relative: 1 %
Eosinophils Absolute: 0.8 10*3/uL — ABNORMAL HIGH (ref 0.0–0.5)
Eosinophils Relative: 10 %
HCT: 38.9 % — ABNORMAL LOW (ref 39.0–52.0)
Hemoglobin: 12.7 g/dL — ABNORMAL LOW (ref 13.0–17.0)
Immature Granulocytes: 0 %
Lymphocytes Relative: 23 %
Lymphs Abs: 1.7 10*3/uL (ref 0.7–4.0)
MCH: 31.3 pg (ref 26.0–34.0)
MCHC: 32.6 g/dL (ref 30.0–36.0)
MCV: 95.8 fL (ref 80.0–100.0)
Monocytes Absolute: 0.8 10*3/uL (ref 0.1–1.0)
Monocytes Relative: 11 %
Neutro Abs: 4.2 10*3/uL (ref 1.7–7.7)
Neutrophils Relative %: 55 %
Platelets: 251 10*3/uL (ref 150–400)
RBC: 4.06 MIL/uL — ABNORMAL LOW (ref 4.22–5.81)
RDW: 12.4 % (ref 11.5–15.5)
WBC: 7.6 10*3/uL (ref 4.0–10.5)
nRBC: 0 % (ref 0.0–0.2)

## 2022-09-21 LAB — TSH: TSH: 1.377 u[IU]/mL (ref 0.350–4.500)

## 2022-09-21 LAB — MAGNESIUM: Magnesium: 2 mg/dL (ref 1.7–2.4)

## 2022-09-21 MED ORDER — FLUTICASONE FUROATE-VILANTEROL 200-25 MCG/ACT IN AEPB: INHALATION_SPRAY | RESPIRATORY_TRACT | 11 refills | Status: DC

## 2022-09-21 NOTE — Progress Notes (Signed)
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  PRIOR THERAPY: none  NGS Results: PD-L1 TPS >95%; EGFR, ALK, ROS1, RET, BRAF V600E negative  CURRENT THERAPY: Keytruda every 6 weeks  BRIEF ONCOLOGIC HISTORY:  Oncology History  Adenocarcinoma of lung, stage 4, right (Antonio Payne)  12/08/2020 Initial Diagnosis   Adenocarcinoma of lung, stage 4, right ([name])   12/08/2020 Cancer Staging   Staging form: Lung, AJCC 8th Edition - Clinical stage from 12/08/2020: Stage IVB (cT2b, cN3, pM1c) - Signed by [name], MD on 12/08/2020 Histopathologic type: Adenocarcinoma, NOS   12/29/2020 - 05/17/2022 Chemotherapy   Patient is on Treatment Plan : LUNG Pembrolizumab q42d     12/29/2020 -  Chemotherapy   Patient is on Treatment Plan : LUNG Pembrolizumab (400) q42d       CANCER STAGING:  Cancer Staging  Adenocarcinoma of lung, stage 4, right (Antonio Payne) Staging form: Lung, AJCC 8th Edition - Clinical stage from 12/08/2020: Stage IVB (cT2b, cN3, pM1c) - Signed by [name], MD on 12/08/2020   INTERVAL HISTORY:  [name], a 66 y.o. male, seen for follow-up of metastatic adenocarcinoma of the lung.  He does not report any immunotherapy related side effects.  Energy levels are 100%.  No skin rashes reported.  He had biopsy of the left axillary lymph node on 08/31/2022.   REVIEW OF SYSTEMS:  Review of Systems  Constitutional:  Negative for unexpected weight change.  All other systems reviewed and are negative.   PAST MEDICAL/SURGICAL HISTORY:  Past Medical History:  Diagnosis Date   Cancer ([place])    lung cancer    Past Surgical History:  Procedure Laterality Date   BACK SURGERY      SOCIAL HISTORY:  Social History   Socioeconomic History   Marital status: Single    Spouse name: Not on file   Number of  children: Not on file   Years of education: Not on file   Highest education level: Not on file  Occupational History   Not on file  Tobacco Use   Smoking status: Former    Types: Cigarettes    Quit date: 12/01/2019    Years since quitting: 2.8   Smokeless tobacco: Never  Substance and Sexual Activity   Alcohol use: Never   Drug use: Never   Sexual activity: Not Currently  Other Topics Concern   Not on file  Social History Narrative   Not on file   Social Determinants of Health   Financial Resource Strain: Low Risk  (12/06/2020)   Overall Financial Resource Strain (CARDIA)    Difficulty of Paying Living Expenses: Not hard at all  Food Insecurity: No Food Insecurity (12/06/2020)   Hunger Vital Sign    Worried About Running Out of Food in the Last Year: Never true    [place] in the Last Year: Never true  Transportation Needs: No Transportation Needs (12/06/2020)   PRAPARE - Hydrologist (Medical): No    Lack of Transportation (Non-Medical): No  Physical Activity: Sufficiently Active (12/06/2020)   Exercise Vital Sign    Days of Exercise per Week: 7 days    Minutes of Exercise per Session: 30 min  Stress: No Stress Concern Present (12/06/2020)   [place]  Questionnaire    Feeling of Stress : Only a little  Social Connections: Moderately Integrated (12/06/2020)   Social Connection and Isolation Panel [NHANES]    Frequency of Communication with Friends and Family: More than three times a week    Frequency of Social Gatherings with Friends and Family: More than three times a week    Attends Religious Services: 1 to 4 times per year    Active Member of [employer] or Organizations: No    Attends Archivist Meetings: 1 to 4 times per year    Marital Status: Divorced  Human resources officer Violence: Not At Risk (12/06/2020)   Humiliation, Afraid, Rape, and Kick questionnaire    Fear of Current or  Ex-Partner: No    Emotionally Abused: No    Physically Abused: No    Sexually Abused: No    FAMILY HISTORY:  No family history on file.  CURRENT MEDICATIONS:  Current Outpatient Medications  Medication Sig Dispense Refill   albuterol (VENTOLIN HFA) 108 (90 Base) MCG/ACT inhaler INHALE 2 PUFFS INTO THE LUNGS EVERY 4 HOURS AS NEEDED FOR WHEEZING OR SHORTNESS OF BREATH 18 g 1   albuterol (VENTOLIN HFA) 108 (90 Base) MCG/ACT inhaler Inhale 2 puffs into the lungs every 4 (four) hours as needed for wheezing or shortness of breath. 18 g 1   fluticasone furoate-vilanterol (BREO ELLIPTA) 200-25 MCG/ACT AEPB INHALE 1 PUFF INTO THE LUNGS EVERY MORNING 60 each 11   No current facility-administered medications for this visit.   Facility-Administered Medications Ordered in Other Visits  Medication Dose Route Frequency Provider Last Rate Last Admin   octreotide (SANDOSTATIN LAR) 30 MG IM injection             ALLERGIES:  No Known Allergies  PHYSICAL EXAM:  Performance status (ECOG): 1 - Symptomatic but completely ambulatory  Vitals:   09/21/22 1501  BP: 138/86  Pulse: 76  Resp: 17  Temp: 98 F (36.7 C)  SpO2: 98%    Wt Readings from Last 3 Encounters:  09/21/22 151 lb 8 oz (68.7 kg)  08/31/22 147 lb (66.7 kg)  08/09/22 147 lb 1.6 oz (66.7 kg)   Physical Exam Vitals reviewed.  Constitutional:      Appearance: Normal appearance.  Cardiovascular:     Rate and Rhythm: Normal rate and regular rhythm.     Pulses: Normal pulses.     Heart sounds: Normal heart sounds.  Pulmonary:     Effort: Pulmonary effort is normal.     Breath sounds: Normal breath sounds.  Neurological:     General: No focal deficit present.     Mental Status: He is alert and oriented to person, place, and time.  Psychiatric:        Mood and Affect: Mood normal.        Behavior: Behavior normal.     LABORATORY DATA:  I have reviewed the labs as listed.     Latest Ref Rng & Units 09/21/2022    2:03 PM  08/31/2022   11:30 AM 08/09/2022   12:18 PM  CBC  WBC 4.0 - 10.5 K/uL 7.6  6.5  5.6   Hemoglobin 13.0 - 17.0 g/dL 12.7  13.4  13.1   Hematocrit 39.0 - 52.0 % 38.9  40.1  38.7   Platelets 150 - 400 K/uL 251  229  241       Latest Ref Rng & Units 09/21/2022    2:03 PM 08/31/2022   11:30 AM 08/09/2022  12:18 PM  CMP  Glucose 70 - 99 mg/dL 86  89  111   BUN 8 - 23 mg/dL _0 Creatinine 0.61 - 1.24 mg/dL 0.84  0.80  0.73   Sodium 135 - 145 mmol/L 130  135  131   Potassium 3.5 - 5.1 mmol/L 4.5  4.6  3.8   Chloride 98 - 111 mmol/L 97  103  98   CO2 22 - 32 mmol/L _1 Calcium 8.9 - 10.3 mg/dL 8.7  8.7  8.8   Total Protein 6.5 - 8.1 g/dL 6.8   6.7   Total Bilirubin 0.3 - 1.2 mg/dL 0.5   0.5   Alkaline Phos 38 - 126 U/L 82   68   AST 15 - 41 U/L 17   20   ALT 0 - 44 U/L 16   17     DIAGNOSTIC IMAGING:  I have independently reviewed the scans and discussed with the patient. [place] CORE BIOPSY (LYMPH NODES)  Result Date: 08/31/2022 INDICATION: RIGHT lung mass.  Hypermetabolic LEFT axillary lymph nodes on PET. EXAM: ULTRASOUND-GUIDED LEFT AXILLARY LYMPH NODE BIOPSY COMPARISON:  PET-CT 08/03/2022. MEDICATIONS: None ANESTHESIA/SEDATION: Local anesthetic was administered. COMPLICATIONS: None immediate. TECHNIQUE: Informed written consent was obtained from the patient after a discussion of the risks, benefits and alternatives to treatment. Questions regarding the procedure were encouraged and answered. Initial ultrasound scanning demonstrated pathologically-enlarged LEFT axillary lymph nodes. An ultrasound image was saved for documentation purposes. The procedure was planned. A timeout was performed prior to the initiation of the procedure. The operative was prepped and draped in the usual sterile fashion, and a sterile drape was applied covering the operative field. A timeout was performed prior to the initiation of the procedure. Local anesthesia was provided with 1% lidocaine with  epinephrine. Under direct ultrasound guidance, an 18 gauge core needle device was utilized to obtain to obtain 4 core needle biopsies of the LEFT axillary lymph nodes. The samples were placed in saline and submitted to pathology. The needle was removed and superficial hemostasis was achieved with manual compression. Post procedure scan was negative for significant hematoma. A dressing was applied. The patient tolerated the procedure well without immediate postprocedural complication. IMPRESSION: Successful ultrasound guided biopsy of dominant LEFT axillary lymph node. Michaelle Birks, MD Vascular and Interventional Radiology Specialists [facility] Radiology Electronically Signed   By: Michaelle Birks M.D.   On: 08/31/2022 16:51     ASSESSMENT:  1.  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 -Mutations were negative for EGFR, ALK, ROS1, RET, BRAF V600 -PD-L1 22 C3 greater than 95%. -He was started on single agent pembrolizumab every 3 weeks under the direction of Dr. Dola Factor in [place] in August 2020, later switched to every 6 weeks. -We will consider testing for K-ras G 12 C, NTR K and met exon 14 mutations upon progression.   2.  Social/family history: -He is a retired Administrator.  Quit smoking in August 2020. -Father had prostate cancer.   PLAN:  1.  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  This has been steadily increasing for the last 1 year.  Stable spiculated solid 3.4 cm right upper lobe  lung mass.  Mild left axillary and left retrocrural adenopathy stable. - PET scan (08/03/2022): Left lower lobe lung nodule 1.3 cm solid irregular with SUV 1.8.  Hypermetabolic spiculated 3.3 cm right upper lobe lung nodule,  SUV 12.  Hypermetabolic left retrocrural and axillary lymph nodes.  Solitary left level 2 neck lymph node. - Right upper lobe lung mass is stable since March 2022.  Steadily increasing left lower lobe lung nodule has very low FDG uptake on PET scan. - We reviewed left axillary lymph node biopsy results from 08/31/2022 consistent with metastatic poorly differentiated carcinoma, consistent with primary lung adenocarcinoma. - I will send NGS testing on the biopsy. - As there is no clear progression, will continue Keytruda at this time.  He will come back tomorrow for his next dose.  RTC 6 weeks for follow-up.  I plan to repeat his scan after next dose of Keytruda.  Will discuss about NGS results next time.   2.  Brain metastasis: - He did not receive prior brain RT. - Last MRI of the brain on 11/21/2021 with no evidence of metastatic disease. - Will plan to repeat MRI of the brain in February 2024.   3.  COPD: - Continue Breo Ellipta once daily and Ventolin as needed.   Orders placed this encounter:  Orders Placed This Encounter  Procedures   CBC with Differential/Platelet   Comprehensive metabolic panel   Magnesium   TSH      [name], MD [place] [phone]

## 2022-09-21 NOTE — Patient Instructions (Addendum)
[facility]  Discharge Instructions  You were seen and examined today by Dr. Delton Coombes.  Dr. Delton Coombes discussed sending your tissue for NGS testing. You do not need to do anything for this it is run on the tissue from the biopsy that you have already had.  Follow-up as scheduled.    Thank you for choosing [place] to provide your oncology and hematology care.   To afford each patient quality time with our provider, please arrive at least 15 minutes before your scheduled appointment time. You may need to reschedule your appointment if you arrive late (10 or more minutes). Arriving late affects you and other patients whose appointments are after yours.  Also, if you miss three or more appointments without notifying the office, you may be dismissed from the clinic at the provider's discretion.    Again, thank you for choosing [facility].  Our hope is that these requests will decrease the amount of time that you wait before being seen by our physicians.   If you have a lab appointment with the [facility] please come in thru the Main Entrance and check in at the main information desk.           _____________________________________________________________  Should you have questions after your visit to [facility], please contact our office at [phone] and follow the prompts.  Our office hours are 8:00 a.m. to 4:30 p.m. Monday - Thursday and 8:00 a.m. to 2:30 p.m. Friday.  Please note that voicemails left after 4:00 p.m. may not be returned until the following business day.  We are closed weekends and all major holidays.  You do have access to a nurse 24-7, just call the main number to the clinic [phone] and do not press any options, hold on the line and a nurse will answer the phone.    For prescription refill requests, have your pharmacy contact our office and allow 72 hours.    Masks are  optional in the cancer centers. If you would like for your care team to wear a mask while they are taking care of you, please let them know. You may have one support person who is at least 66 years old accompany you for your appointments.

## 2022-09-22 ENCOUNTER — Other Ambulatory Visit: Payer: Self-pay

## 2022-09-22 ENCOUNTER — Inpatient Hospital Stay: Payer: Medicare Other

## 2022-09-22 ENCOUNTER — Ambulatory Visit: Payer: Medicare Other

## 2022-09-22 VITALS — BP 124/73 | HR 83 | Temp 98.1°F | Resp 16 | Wt 150.2 lb

## 2022-09-22 DIAGNOSIS — Z5112 Encounter for antineoplastic immunotherapy: Secondary | ICD-10-CM | POA: Diagnosis not present

## 2022-09-22 DIAGNOSIS — C3491 Malignant neoplasm of unspecified part of right bronchus or lung: Secondary | ICD-10-CM

## 2022-09-22 MED ORDER — SODIUM CHLORIDE 0.9 % IV SOLN
400.0000 mg | Freq: Once | INTRAVENOUS | Status: AC
  Administered 2022-09-22: 400 mg via INTRAVENOUS
  Filled 2022-09-22: qty 16

## 2022-09-22 MED ORDER — SODIUM CHLORIDE 0.9 % IV SOLN: Freq: Once | INTRAVENOUS | Status: AC

## 2022-09-22 NOTE — Progress Notes (Signed)
Patient presents today for Keytruda infusion per providers order.  Vital signs and labs within parameters for treatment.  Patient has no new complaints at this time.  Peripheral IV started and blood return noted pre and post infusion.  Treatment given today per MD orders.  Stable during infusion without adverse affects.  Vital signs stable.  No complaints at this time.  Discharge from clinic ambulatory in stable condition.  Alert and oriented X 3.  Follow up with [facility] as scheduled.

## 2022-09-22 NOTE — Patient Instructions (Signed)
[facility] [place]  Discharge Instructions: Thank you for choosing [facility] to provide your oncology and hematology care.  If you have a lab appointment with the [facility], please come in thru the Main Entrance and check in at the main information desk.  Wear comfortable clothing and clothing appropriate for easy access to any Portacath or PICC line.   We strive to give you quality time with your provider. You may need to reschedule your appointment if you arrive late (15 or more minutes).  Arriving late affects you and other patients whose appointments are after yours.  Also, if you miss three or more appointments without notifying the office, you may be dismissed from the clinic at the provider's discretion.      For prescription refill requests, have your pharmacy contact our office and allow 72 hours for refills to be completed.    Today you received the following chemotherapy and/or immunotherapy agents Keytruda      To help prevent nausea and vomiting after your treatment, we encourage you to take your nausea medication as directed.  BELOW ARE SYMPTOMS THAT SHOULD BE REPORTED IMMEDIATELY: *FEVER GREATER THAN 100.4 F (38 C) OR HIGHER *CHILLS OR SWEATING *NAUSEA AND VOMITING THAT IS NOT CONTROLLED WITH YOUR NAUSEA MEDICATION *UNUSUAL SHORTNESS OF BREATH *UNUSUAL BRUISING OR BLEEDING *URINARY PROBLEMS (pain or burning when urinating, or frequent urination) *BOWEL PROBLEMS (unusual diarrhea, constipation, pain near the anus) TENDERNESS IN MOUTH AND THROAT WITH OR WITHOUT PRESENCE OF ULCERS (sore throat, sores in mouth, or a toothache) UNUSUAL RASH, SWELLING OR PAIN  UNUSUAL VAGINAL DISCHARGE OR ITCHING   Items with * indicate a potential emergency and should be followed up as soon as possible or go to the Emergency Department if any problems should occur.  Please show the CHEMOTHERAPY ALERT CARD or IMMUNOTHERAPY ALERT CARD at check-in to the  Emergency Department and triage nurse.  Should you have questions after your visit or need to cancel or reschedule your appointment, please contact [facility] [phone]  and follow the prompts.  Office hours are 8:00 a.m. to 4:30 p.m. Monday - Friday. Please note that voicemails left after 4:00 p.m. may not be returned until the following business day.  We are closed weekends and major holidays. You have access to a nurse at all times for urgent questions. Please call the main number to the clinic [phone] and follow the prompts.  For any non-urgent questions, you may also contact your provider using MyChart. We now offer e-Visits for anyone 67 and older to request care online for non-urgent symptoms. For details visit [email or web address].   Also download the MyChart app! Go to the app store, search "MyChart", open the app, select [facility], and log in with your MyChart username and password.  Masks are optional in the cancer centers. If you would like for your care team to wear a mask while they are taking care of you, please let them know. You may have one support person who is at least 66 years old accompany you for your appointments.

## 2022-09-28 ENCOUNTER — Other Ambulatory Visit (HOSPITAL_COMMUNITY): Payer: Self-pay | Admitting: Hematology

## 2022-10-21 ENCOUNTER — Encounter: Payer: Self-pay | Admitting: Hematology

## 2022-10-25 ENCOUNTER — Encounter: Payer: Self-pay | Admitting: Hematology

## 2022-11-01 ENCOUNTER — Inpatient Hospital Stay: Payer: Medicare Other | Attending: Hematology | Admitting: Hematology

## 2022-11-01 ENCOUNTER — Ambulatory Visit: Payer: Medicare Other | Admitting: Hematology

## 2022-11-01 ENCOUNTER — Inpatient Hospital Stay: Payer: Medicare Other

## 2022-11-01 VITALS — BP 127/89 | HR 75 | Temp 97.6°F | Resp 18 | Wt 153.7 lb

## 2022-11-01 VITALS — BP 123/72 | HR 76 | Temp 97.6°F | Resp 18

## 2022-11-01 DIAGNOSIS — Z79899 Other long term (current) drug therapy: Secondary | ICD-10-CM | POA: Insufficient documentation

## 2022-11-01 DIAGNOSIS — C3491 Malignant neoplasm of unspecified part of right bronchus or lung: Secondary | ICD-10-CM | POA: Diagnosis present

## 2022-11-01 DIAGNOSIS — R911 Solitary pulmonary nodule: Secondary | ICD-10-CM

## 2022-11-01 DIAGNOSIS — C7931 Secondary malignant neoplasm of brain: Secondary | ICD-10-CM | POA: Insufficient documentation

## 2022-11-01 DIAGNOSIS — Z5112 Encounter for antineoplastic immunotherapy: Secondary | ICD-10-CM | POA: Diagnosis present

## 2022-11-01 LAB — CBC WITH DIFFERENTIAL/PLATELET
Abs Immature Granulocytes: 0.02 10*3/uL (ref 0.00–0.07)
Basophils Absolute: 0 10*3/uL (ref 0.0–0.1)
Basophils Relative: 1 %
Eosinophils Absolute: 0.5 10*3/uL (ref 0.0–0.5)
Eosinophils Relative: 9 %
HCT: 41.7 % (ref 39.0–52.0)
Hemoglobin: 13.9 g/dL (ref 13.0–17.0)
Immature Granulocytes: 0 %
Lymphocytes Relative: 18 %
Lymphs Abs: 1.1 10*3/uL (ref 0.7–4.0)
MCH: 31.8 pg (ref 26.0–34.0)
MCHC: 33.3 g/dL (ref 30.0–36.0)
MCV: 95.4 fL (ref 80.0–100.0)
Monocytes Absolute: 0.8 10*3/uL (ref 0.1–1.0)
Monocytes Relative: 14 %
Neutro Abs: 3.4 10*3/uL (ref 1.7–7.7)
Neutrophils Relative %: 58 %
Platelets: 224 10*3/uL (ref 150–400)
RBC: 4.37 MIL/uL (ref 4.22–5.81)
RDW: 12.3 % (ref 11.5–15.5)
WBC: 5.8 10*3/uL (ref 4.0–10.5)
nRBC: 0 % (ref 0.0–0.2)

## 2022-11-01 LAB — COMPREHENSIVE METABOLIC PANEL
ALT: 17 U/L (ref 0–44)
AST: 23 U/L (ref 15–41)
Albumin: 4.2 g/dL (ref 3.5–5.0)
Alkaline Phosphatase: 81 U/L (ref 38–126)
Anion gap: 8 (ref 5–15)
BUN: 15 mg/dL (ref 8–23)
CO2: 28 mmol/L (ref 22–32)
Calcium: 9.1 mg/dL (ref 8.9–10.3)
Chloride: 96 mmol/L — ABNORMAL LOW (ref 98–111)
Creatinine, Ser: 0.85 mg/dL (ref 0.61–1.24)
GFR, Estimated: >60 mL/min (ref >60)
Glucose, Bld: 81 mg/dL (ref 70–99)
Potassium: 4.3 mmol/L (ref 3.5–5.1)
Sodium: 132 mmol/L — ABNORMAL LOW (ref 135–145)
Total Bilirubin: 0.8 mg/dL (ref 0.3–1.2)
Total Protein: 7.2 g/dL (ref 6.5–8.1)

## 2022-11-01 LAB — TSH: TSH: 1.566 u[IU]/mL (ref 0.350–4.500)

## 2022-11-01 LAB — MAGNESIUM: Magnesium: 2.2 mg/dL (ref 1.7–2.4)

## 2022-11-01 MED ORDER — SODIUM CHLORIDE 0.9 % IV SOLN: Freq: Once | INTRAVENOUS | Status: AC

## 2022-11-01 MED ORDER — SODIUM CHLORIDE 0.9 % IV SOLN
400.0000 mg | Freq: Once | INTRAVENOUS | Status: AC
  Administered 2022-11-01: 400 mg via INTRAVENOUS
  Filled 2022-11-01: qty 16

## 2022-11-01 NOTE — Patient Instructions (Signed)
[facility]  Discharge Instructions: Thank you for choosing [facility] to provide your oncology and hematology care.  If you have a lab appointment with the [facility], please come in thru the Main Entrance and check in at the main information desk.  Wear comfortable clothing and clothing appropriate for easy access to any Portacath or PICC line.   We strive to give you quality time with your provider. You may need to reschedule your appointment if you arrive late (15 or more minutes).  Arriving late affects you and other patients whose appointments are after yours.  Also, if you miss three or more appointments without notifying the office, you may be dismissed from the clinic at the provider's discretion.      For prescription refill requests, have your pharmacy contact our office and allow 72 hours for refills to be completed.    Today you received the following chemotherapy and/or immunotherapy agents Keytruda.  Pembrolizumab Injection What is this medication? PEMBROLIZUMAB (PEM broe LIZ ue mab) treats some types of cancer. It works by helping your immune system slow or stop the spread of cancer cells. It is a monoclonal antibody. This medicine may be used for other purposes; ask your health care provider or pharmacist if you have questions. COMMON BRAND NAME(S): Keytruda What should I tell my care team before I take this medication? They need to know if you have any of these conditions: Allogeneic stem cell transplant (uses someone else's stem cells) Autoimmune diseases, such as Crohn disease, ulcerative colitis, lupus History of chest radiation Nervous system problems, such as Guillain-Barre syndrome, myasthenia gravis Organ transplant An unusual or allergic reaction to pembrolizumab, other medications, foods, dyes, or preservatives Pregnant or trying to get pregnant Breast-feeding How should I use this medication? This medication is injected  into a vein. It is given by your care team in a hospital or clinic setting. A special MedGuide will be given to you before each treatment. Be sure to read this information carefully each time. Talk to your care team about the use of this medication in children. While it may be prescribed for children as young as 6 months for selected conditions, precautions do apply. Overdosage: If you think you have taken too much of this medicine contact a poison control center or emergency room at once. NOTE: This medicine is only for you. Do not share this medicine with others. What if I miss a dose? Keep appointments for follow-up doses. It is important not to miss your dose. Call your care team if you are unable to keep an appointment. What may interact with this medication? Interactions have not been studied. This list may not describe all possible interactions. Give your health care provider a list of all the medicines, herbs, non-prescription drugs, or dietary supplements you use. Also tell them if you smoke, drink alcohol, or use illegal drugs. Some items may interact with your medicine. What should I watch for while using this medication? Your condition will be monitored carefully while you are receiving this medication. You may need blood work while taking this medication. This medication may cause serious skin reactions. They can happen weeks to months after starting the medication. Contact your care team right away if you notice fevers or flu-like symptoms with a rash. The rash may be red or purple and then turn into blisters or peeling of the skin. You may also notice a red rash with swelling of the face, lips, or lymph  nodes in your neck or under your arms. Tell your care team right away if you have any change in your eyesight. Talk to your care team if you may be pregnant. Serious birth defects can occur if you take this medication during pregnancy and for 4 months after the last dose. You will need a  negative pregnancy test before starting this medication. Contraception is recommended while taking this medication and for 4 months after the last dose. Your care team can help you find the option that works for you. Do not breastfeed while taking this medication and for 4 months after the last dose. What side effects may I notice from receiving this medication? Side effects that you should report to your care team as soon as possible: Allergic reactions--skin rash, itching, hives, swelling of the face, lips, tongue, or throat Dry cough, shortness of breath or trouble breathing Eye pain, redness, irritation, or discharge with blurry or decreased vision Heart muscle inflammation--unusual weakness or fatigue, shortness of breath, chest pain, fast or irregular heartbeat, dizziness, swelling of the ankles, feet, or hands Hormone gland problems--headache, sensitivity to light, unusual weakness or fatigue, dizziness, fast or irregular heartbeat, increased sensitivity to cold or heat, excessive sweating, constipation, hair loss, increased thirst or amount of urine, tremors or shaking, irritability Infusion reactions--chest pain, shortness of breath or trouble breathing, feeling faint or lightheaded Kidney injury (glomerulonephritis)--decrease in the amount of urine, red or dark Skylor Schnapp urine, foamy or bubbly urine, swelling of the ankles, hands, or feet Liver injury--right upper belly pain, loss of appetite, nausea, light-colored stool, dark yellow or Pixie Burgener urine, yellowing skin or eyes, unusual weakness or fatigue Pain, tingling, or numbness in the hands or feet, muscle weakness, change in vision, confusion or trouble speaking, loss of balance or coordination, trouble walking, seizures Rash, fever, and swollen lymph nodes Redness, blistering, peeling, or loosening of the skin, including inside the mouth Sudden or severe stomach pain, bloody diarrhea, fever, nausea, vomiting Side effects that usually do not  require medical attention (report to your care team if they continue or are bothersome): Bone, joint, or muscle pain Diarrhea Fatigue Loss of appetite Nausea Skin rash This list may not describe all possible side effects. Call your doctor for medical advice about side effects. You may report side effects to FDA at 1-800-FDA-1088. Where should I keep my medication? This medication is given in a hospital or clinic. It will not be stored at home. NOTE: This sheet is a summary. It may not cover all possible information. If you have questions about this medicine, talk to your doctor, pharmacist, or health care provider.  2023 Elsevier/Gold Standard (2013-06-09 00:00:00)        To help prevent nausea and vomiting after your treatment, we encourage you to take your nausea medication as directed.  BELOW ARE SYMPTOMS THAT SHOULD BE REPORTED IMMEDIATELY: *FEVER GREATER THAN 100.4 F (38 C) OR HIGHER *CHILLS OR SWEATING *NAUSEA AND VOMITING THAT IS NOT CONTROLLED WITH YOUR NAUSEA MEDICATION *UNUSUAL SHORTNESS OF BREATH *UNUSUAL BRUISING OR BLEEDING *URINARY PROBLEMS (pain or burning when urinating, or frequent urination) *BOWEL PROBLEMS (unusual diarrhea, constipation, pain near the anus) TENDERNESS IN MOUTH AND THROAT WITH OR WITHOUT PRESENCE OF ULCERS (sore throat, sores in mouth, or a toothache) UNUSUAL RASH, SWELLING OR PAIN  UNUSUAL VAGINAL DISCHARGE OR ITCHING   Items with * indicate a potential emergency and should be followed up as soon as possible or go to the Emergency Department if any problems should occur.  Please show the CHEMOTHERAPY ALERT CARD or IMMUNOTHERAPY ALERT CARD at check-in to the Emergency Department and triage nurse.  Should you have questions after your visit or need to cancel or reschedule your appointment, please contact [facility] [phone]  and follow the prompts.  Office hours are 8:00 a.m. to 4:30 p.m. Monday - Friday. Please note  that voicemails left after 4:00 p.m. may not be returned until the following business day.  We are closed weekends and major holidays. You have access to a nurse at all times for urgent questions. Please call the main number to the clinic [phone] and follow the prompts.  For any non-urgent questions, you may also contact your provider using MyChart. We now offer e-Visits for anyone 51 and older to request care online for non-urgent symptoms. For details visit [email or web address].   Also download the MyChart app! Go to the app store, search "MyChart", open the app, select [facility], and log in with your MyChart username and password.

## 2022-11-01 NOTE — Progress Notes (Signed)
Patient has been examined by Dr. Katragadda, and vital signs and labs have been reviewed. ANC, Creatinine, LFTs, hemoglobin, and platelets are within treatment parameters per M.D. - pt may proceed with treatment.  Primary RN and pharmacy notified.  

## 2022-11-01 NOTE — Progress Notes (Signed)
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  PRIOR THERAPY: none  NGS Results: PD-L1 TPS >95%; EGFR, ALK, ROS1, RET, BRAF V600E negative  CURRENT THERAPY: Keytruda every 6 weeks  BRIEF ONCOLOGIC HISTORY:  Oncology History  Adenocarcinoma of lung, stage 4, right ([facility])  12/08/2020 Initial Diagnosis   Adenocarcinoma of lung, stage 4, right ([facility])   12/08/2020 Cancer Staging   Staging form: Lung, AJCC 8th Edition - Clinical stage from 12/08/2020: Stage IVB (cT2b, cN3, pM1c) - Signed by [name], MD on 12/08/2020 Histopathologic type: Adenocarcinoma, NOS   12/29/2020 - 05/17/2022 Chemotherapy   Patient is on Treatment Plan : LUNG Pembrolizumab q42d     12/29/2020 -  Chemotherapy   Patient is on Treatment Plan : LUNG Pembrolizumab (400) q42d       CANCER STAGING:  Cancer Staging  Adenocarcinoma of lung, stage 4, right ([facility]) Staging form: Lung, AJCC 8th Edition - Clinical stage from 12/08/2020: Stage IVB (cT2b, cN3, pM1c) - Signed by [name], MD on 12/08/2020   INTERVAL HISTORY:  Antonio Payne, a 67 y.o. male, seen for follow-up of metastatic adenocarcinoma of the lung. He was last seen by me on 09/21/22. We again discussed his biopsy results from 08/31/2022.  Today, he states that he is doing well overall. His appetite level is at 100%. His energy level is at 100%. He denies any negative side effects with the Keytruda. His breathing is stable with Breo and Ventolin inhalers. He denies any rashes, DOE, diarrhea, or dry cough. He states that he does bruise easily.   REVIEW OF SYSTEMS:  Review of Systems  Constitutional:  Negative for chills,  fatigue and fever.  HENT:   Negative for lump/mass, mouth sores, nosebleeds, sore throat and trouble swallowing.   Respiratory:  Negative for cough and shortness of breath.   Cardiovascular:  Negative for chest pain, leg swelling and palpitations.  Gastrointestinal:  Negative for abdominal pain, constipation, diarrhea, nausea and vomiting.  Genitourinary:  Negative for bladder incontinence, difficulty urinating, dysuria, frequency, hematuria and nocturia.   Musculoskeletal:  Negative for arthralgias, back pain, flank pain, myalgias and neck pain.  Skin:  Negative for itching and rash.  Neurological:  Negative for dizziness, headaches and numbness.  Hematological:  Bruises/bleeds easily.  Psychiatric/Behavioral:  Negative for depression, sleep disturbance and suicidal ideas. The patient is not nervous/anxious.   All other systems reviewed and are negative.   PAST MEDICAL/SURGICAL HISTORY:  Past Medical History:  Diagnosis Date   Cancer (Antonio Payne)    lung cancer    Past Surgical History:  Procedure Laterality Date   BACK SURGERY      SOCIAL HISTORY:  Social History   Socioeconomic History   Marital status: Single    Spouse name: Not on file   Number of children: Not on file   Years of education: Not on file   Highest education level: Not on file  Occupational History   Not on file  Tobacco Use   Smoking status: Former    Types: Cigarettes    Quit date: 12/01/2019    Years since quitting: 2.9   Smokeless tobacco: Never  Substance and Sexual Activity   Alcohol use: Never  Drug use: Never   Sexual activity: Not Currently  Other Topics Concern   Not on file  Social History Narrative   Not on file   Social Determinants of Health   Financial Resource Strain: Low Risk  (12/06/2020)   Overall Financial Resource Strain (CARDIA)    Difficulty of Paying Living Expenses: Not hard at all  Food Insecurity: No Food Insecurity (12/06/2020)   Hunger Vital Sign    Worried About Running  Out of Food in the Last Year: Never true    [place] in the Last Year: Never true  Transportation Needs: No Transportation Needs (12/06/2020)   PRAPARE - Hydrologist (Medical): No    Lack of Transportation (Non-Medical): No  Physical Activity: Sufficiently Active (12/06/2020)   Exercise Vital Sign    Days of Exercise per Week: 7 days    Minutes of Exercise per Session: 30 min  Stress: No Stress Concern Present (12/06/2020)   [place]    Feeling of Stress : Only a little  Social Connections: Moderately Integrated (12/06/2020)   Social Connection and Isolation Panel [NHANES]    Frequency of Communication with Friends and Family: More than three times a week    Frequency of Social Gatherings with Friends and Family: More than three times a week    Attends Religious Services: 1 to 4 times per year    Active Member of [employer] or Organizations: No    Attends Archivist Meetings: 1 to 4 times per year    Marital Status: Divorced  Human resources officer Violence: Not At Risk (12/06/2020)   Humiliation, Afraid, Rape, and Kick questionnaire    Fear of Current or Ex-Partner: No    Emotionally Abused: No    Physically Abused: No    Sexually Abused: No    FAMILY HISTORY:  No family history on file.  CURRENT MEDICATIONS:  Current Outpatient Medications  Medication Sig Dispense Refill   albuterol (VENTOLIN HFA) 108 (90 Base) MCG/ACT inhaler Inhale 2 puffs into the lungs every 4 (four) hours as needed for wheezing or shortness of breath. 18 g 1   albuterol (VENTOLIN HFA) 108 (90 Base) MCG/ACT inhaler INHALE 2 PUFFS INTO THE LUNGS EVERY 4 HOURS AS NEEDED FOR WHEEZING OR SHORTNESS OF BREATH 18 g 1   fluticasone furoate-vilanterol (BREO ELLIPTA) 200-25 MCG/ACT AEPB INHALE 1 PUFF INTO THE LUNGS EVERY MORNING 60 each 11   No current facility-administered medications for this visit.    Facility-Administered Medications Ordered in Other Visits  Medication Dose Route Frequency Provider Last Rate Last Admin   octreotide (SANDOSTATIN LAR) 30 MG IM injection             ALLERGIES:  No Known Allergies  PHYSICAL EXAM:  Performance status (ECOG): 1 - Symptomatic but completely ambulatory  Vitals:   11/01/22 1255  BP: 127/89  Pulse: 75  Resp: 18  Temp: 97.6 F (36.4 C)  SpO2: 100%    Wt Readings from Last 3 Encounters:  11/01/22 69.7 kg (153 lb 11.2 oz)  09/22/22 68.1 kg (150 lb 3.2 oz)  09/21/22 68.7 kg (151 lb 8 oz)   Physical Exam Vitals and nursing note reviewed. Exam conducted with a chaperone present.  Constitutional:      Appearance: Normal appearance.  Cardiovascular:     Rate and Rhythm: Normal rate and regular rhythm.     Pulses: Normal pulses.     Heart  sounds: Normal heart sounds.  Pulmonary:     Effort: Pulmonary effort is normal.     Breath sounds: Normal breath sounds.  Abdominal:     Palpations: Abdomen is soft. There is no hepatomegaly, splenomegaly or mass.     Tenderness: There is no abdominal tenderness.  Lymphadenopathy:     Cervical: No cervical adenopathy.     Right cervical: No superficial cervical adenopathy.    Left cervical: No superficial cervical adenopathy.  Neurological:     General: No focal deficit present.     Mental Status: He is alert and oriented to person, place, and time.  Psychiatric:        Mood and Affect: Mood normal.        Behavior: Behavior normal.     LABORATORY DATA:  I have reviewed the labs as listed.     Latest Ref Rng & Units 11/01/2022   11:57 AM 09/21/2022    2:03 PM 08/31/2022   11:30 AM  CBC  WBC 4.0 - 10.5 K/uL 5.8  7.6  6.5   Hemoglobin 13.0 - 17.0 g/dL 13.9  12.7  13.4   Hematocrit 39.0 - 52.0 % 41.7  38.9  40.1   Platelets 150 - 400 K/uL 224  251  229       Latest Ref Rng & Units 11/01/2022   11:57 AM 09/21/2022    2:03 PM 08/31/2022   11:30 AM  CMP  Glucose 70 - 99 mg/dL 81   86  89   BUN 8 - 23 mg/dL 15  20  17    Creatinine 0.61 - 1.24 mg/dL 0.85  0.84  0.80   Sodium 135 - 145 mmol/L 132  130  135   Potassium 3.5 - 5.1 mmol/L 4.3  4.5  4.6   Chloride 98 - 111 mmol/L 96  97  103   CO2 22 - 32 mmol/L 28  27  24    Calcium 8.9 - 10.3 mg/dL 9.1  8.7  8.7   Total Protein 6.5 - 8.1 g/dL 7.2  6.8    Total Bilirubin 0.3 - 1.2 mg/dL 0.8  0.5    Alkaline Phos 38 - 126 U/L 81  82    AST 15 - 41 U/L 23  17    ALT 0 - 44 U/L 17  16      DIAGNOSTIC IMAGING:  I have independently reviewed the scans and discussed with the patient. No results found.   Pathology report 08/31/2022: FINAL MICROSCOPIC DIAGNOSIS:  A. LYMPH NODE, LEFT AXILLARY, BIOPSY:  - Metastatic poorly differentiated carcinoma to a lymph node, consistent  with patient's clinical history of primary lung adenocarcinoma    NM PET 08/03/2022: IMPRESSION: 1. Hypermetabolic spiculated solid 3.3 cm right upper lobe lung mass, compatible with viable primary bronchogenic carcinoma. 2. Solid irregular 1.3 cm medial left lower lobe pulmonary nodule with low level FDG uptake, suspicious for contralateral pulmonary metastasis. 3. Hypermetabolic left retropectoral and left axillary nodal metastases. 4. Solitary hypermetabolic left level II neck lymph node, compatible with nodal metastasis. 5. No hypermetabolic metastatic disease in the abdomen, pelvis or skeleton. 6. Aortic Atherosclerosis (ICD10-I70.0) and Emphysema (ICD10-J43.9).    ASSESSMENT:  1.  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 -Mutations were negative for EGFR, ALK, ROS1, RET, BRAF V600 -PD-L1 22 C3 greater than 95%. -He was started on single agent pembrolizumab every  3 weeks under the direction of Dr. Dola Factor in [place] in August 2020, later switched to every 6 weeks. - CT chest (06/21/2022):  Irregular solid 1.4 cm left lower lobe lung nodule, slightly increased suspicious for malignancy.  This has been steadily increasing for the last 1 year.  Stable spiculated solid 3.4 cm right upper lobe lung mass.  Mild left axillary and left retrocrural adenopathy stable. - PET scan (08/03/2022): Left lower lobe lung nodule 1.3 cm solid irregular with SUV 1.8.  Hypermetabolic spiculated 3.3 cm right upper lobe lung nodule, SUV 12.  Hypermetabolic left retrocrural and axillary lymph nodes.  Solitary left level 2 neck lymph node. - Right upper lobe lung mass is stable since March 2022.  Steadily increasing left lower lobe lung nodule has very low FDG uptake on PET scan. - Left axillary lymph node biopsy (08/31/2022): Metastatic poorly differentiated adenocarcinoma. - NGS: PD-L1 TPS 100%, no other targetable mutations.  TMB-low.  MSI-stable.  CDK N2A pathogenic variant.   2.  Social/family history: -He is a retired Administrator.  Quit smoking in August 2020. -Father had prostate cancer.   PLAN:  1.  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 - He does not report any immunotherapy related side effects. - I have reviewed labs today which shows normal LFTs.  CBC was grossly normal.  TSH is 1.5. - We have discussed NGS results which did not show any targetable mutations. - He will continue Keytruda every 6 weeks.  I will see him back in 12 weeks for follow-up.  I plan to repeat CT chest with contrast prior to next visit.   2.  Brain metastasis: - He did not receive prior brain RT.  Last MRI of the brain on 11/21/2020 with no evidence of metastatic disease. - Will obtain MRI of the brain with and without contrast prior to next visit.   3.  COPD: - Continue Breo Ellipta once daily and Ventolin as needed.  Symptoms well-controlled.   Orders placed this encounter:  No orders of the defined types were  placed in this encounter.   I,Alexis Herring,acting as a Education administrator for [employer], MD.,have documented all relevant documentation on the behalf of [name], MD,as directed by  [name], MD while in the presence of [name], MD.  I, [name] MD, have reviewed the above documentation for accuracy and completeness, and I agree with the above.    [name], MD [place] [phone]

## 2022-11-01 NOTE — Patient Instructions (Signed)
[facility] at [facility] Discharge Instructions   You were seen and examined today by Dr. Delton Coombes.  He reviewed the results of your lab work which is normal/stable.   He discussed with you the results of the special testing we sent. Your PD-L1 is very high which means you will receive good benefit with continuing immunotherapy (Keytruda). There were no other mutations to target based on this testing.   We will proceed with your treatment today.   Return as scheduled.    Thank you for choosing [facility] at [facility] to provide your oncology and hematology care.  To afford each patient quality time with our provider, please arrive at least 15 minutes before your scheduled appointment time.   If you have a lab appointment with the [facility] please come in thru the Main Entrance and check in at the main information desk.  You need to re-schedule your appointment should you arrive 10 or more minutes late.  We strive to give you quality time with our providers, and arriving late affects you and other patients whose appointments are after yours.  Also, if you no show three or more times for appointments you may be dismissed from the clinic at the providers discretion.     Again, thank you for choosing [facility].  Our hope is that these requests will decrease the amount of time that you wait before being seen by our physicians.       _____________________________________________________________  Should you have questions after your visit to [facility], please contact our office at [phone] and follow the prompts.  Our office hours are 8:00 a.m. and 4:30 p.m. Monday - Friday.  Please note that voicemails left after 4:00 p.m. may not be returned until the following business day.  We are closed weekends and major holidays.  You do have access to a nurse 24-7, just call the main number to the clinic [phone]  and do not press any options, hold on the line and a nurse will answer the phone.    For prescription refill requests, have your pharmacy contact our office and allow 72 hours.    Due to Covid, you will need to wear a mask upon entering the hospital. If you do not have a mask, a mask will be given to you at the Main Entrance upon arrival. For doctor visits, patients may have 1 support person age 52 or older with them. For treatment visits, patients can not have anyone with them due to social distancing guidelines and our immunocompromised population.

## 2022-11-01 NOTE — Progress Notes (Signed)
Patient presents today for Keytruda infusion.  Patient is in satisfactory condition with no new complaints voiced.  Vital signs are stable. Labs reviewed by Dr. Delton Coombes during his office visit.  All labs are within treatment parameters.  We will proceed with treatment per MD orders.   Patient tolerated treatment well with no complaints voiced.  Patient left ambulatory in stable condition.  Vital signs stable at discharge.  Follow up as scheduled.

## 2022-11-03 ENCOUNTER — Other Ambulatory Visit: Payer: Self-pay

## 2022-11-05 ENCOUNTER — Other Ambulatory Visit: Payer: Self-pay | Admitting: Hematology

## 2022-11-06 ENCOUNTER — Encounter: Payer: Self-pay | Admitting: Hematology

## 2022-11-12 ENCOUNTER — Other Ambulatory Visit: Payer: Self-pay

## 2022-12-13 ENCOUNTER — Other Ambulatory Visit: Payer: Self-pay

## 2022-12-13 ENCOUNTER — Other Ambulatory Visit: Payer: Self-pay | Admitting: Hematology

## 2022-12-13 DIAGNOSIS — C3491 Malignant neoplasm of unspecified part of right bronchus or lung: Secondary | ICD-10-CM

## 2022-12-14 ENCOUNTER — Inpatient Hospital Stay: Payer: Medicare Other

## 2022-12-14 ENCOUNTER — Inpatient Hospital Stay: Payer: Medicare Other | Attending: Hematology

## 2022-12-14 ENCOUNTER — Inpatient Hospital Stay: Payer: Medicare Other | Admitting: Hematology

## 2022-12-14 VITALS — BP 127/79 | HR 86 | Temp 96.5°F | Resp 18 | Wt 152.4 lb

## 2022-12-14 DIAGNOSIS — C3491 Malignant neoplasm of unspecified part of right bronchus or lung: Secondary | ICD-10-CM

## 2022-12-14 DIAGNOSIS — Z79620 Long term (current) use of immunosuppressive biologic: Secondary | ICD-10-CM | POA: Diagnosis not present

## 2022-12-14 DIAGNOSIS — Z5112 Encounter for antineoplastic immunotherapy: Secondary | ICD-10-CM | POA: Diagnosis not present

## 2022-12-14 LAB — CBC WITH DIFFERENTIAL/PLATELET
Abs Immature Granulocytes: 0.03 10*3/uL (ref 0.00–0.07)
Basophils Absolute: 0.1 10*3/uL (ref 0.0–0.1)
Basophils Relative: 1 %
Eosinophils Absolute: 0.5 10*3/uL (ref 0.0–0.5)
Eosinophils Relative: 7 %
HCT: 40.1 % (ref 39.0–52.0)
Hemoglobin: 13.2 g/dL (ref 13.0–17.0)
Immature Granulocytes: 0 %
Lymphocytes Relative: 20 %
Lymphs Abs: 1.3 10*3/uL (ref 0.7–4.0)
MCH: 30.6 pg (ref 26.0–34.0)
MCHC: 32.9 g/dL (ref 30.0–36.0)
MCV: 93 fL (ref 80.0–100.0)
Monocytes Absolute: 0.8 10*3/uL (ref 0.1–1.0)
Monocytes Relative: 12 %
Neutro Abs: 4.1 10*3/uL (ref 1.7–7.7)
Neutrophils Relative %: 60 %
Platelets: 267 10*3/uL (ref 150–400)
RBC: 4.31 MIL/uL (ref 4.22–5.81)
RDW: 12.4 % (ref 11.5–15.5)
WBC: 6.7 10*3/uL (ref 4.0–10.5)
nRBC: 0 % (ref 0.0–0.2)

## 2022-12-14 LAB — COMPREHENSIVE METABOLIC PANEL
ALT: 15 U/L (ref 0–44)
AST: 20 U/L (ref 15–41)
Albumin: 3.8 g/dL (ref 3.5–5.0)
Alkaline Phosphatase: 76 U/L (ref 38–126)
Anion gap: 7 (ref 5–15)
BUN: 11 mg/dL (ref 8–23)
CO2: 24 mmol/L (ref 22–32)
Calcium: 8.5 mg/dL — ABNORMAL LOW (ref 8.9–10.3)
Chloride: 98 mmol/L (ref 98–111)
Creatinine, Ser: 0.82 mg/dL (ref 0.61–1.24)
GFR, Estimated: >60 mL/min (ref >60)
Glucose, Bld: 83 mg/dL (ref 70–99)
Potassium: 4.2 mmol/L (ref 3.5–5.1)
Sodium: 129 mmol/L — ABNORMAL LOW (ref 135–145)
Total Bilirubin: 0.6 mg/dL (ref 0.3–1.2)
Total Protein: 6.7 g/dL (ref 6.5–8.1)

## 2022-12-14 LAB — MAGNESIUM: Magnesium: 2.2 mg/dL (ref 1.7–2.4)

## 2022-12-14 LAB — TSH: TSH: 1.46 u[IU]/mL (ref 0.350–4.500)

## 2022-12-14 MED ORDER — SODIUM CHLORIDE 0.9 % IV SOLN: Freq: Once | INTRAVENOUS | Status: AC

## 2022-12-14 MED ORDER — SODIUM CHLORIDE 0.9 % IV SOLN
400.0000 mg | Freq: Once | INTRAVENOUS | Status: AC
  Administered 2022-12-14: 400 mg via INTRAVENOUS
  Filled 2022-12-14: qty 16

## 2022-12-14 NOTE — Progress Notes (Signed)
Patient presents today for Keytruda infusion. Vital signs and labs within parameters for treatment. MAR reviewed and updated. Patient denies any side effects related to treatment. No complaints noted at this time.   Keytruda given today per MD orders. Tolerated infusion without adverse affects. Vital signs stable. No complaints at this time. Discharged from clinic ambulatory in stable condition. Alert and oriented x 3. F/U with [facility] as scheduled.

## 2022-12-14 NOTE — Patient Instructions (Signed)
[facility]  Discharge Instructions: Thank you for choosing [facility] to provide your oncology and hematology care.  If you have a lab appointment with the Cancer Center, please come in thru the Main Entrance and check in at the main information desk.  Wear comfortable clothing and clothing appropriate for easy access to any Portacath or PICC line.   We strive to give you quality time with your provider. You may need to reschedule your appointment if you arrive late (15 or more minutes).  Arriving late affects you and other patients whose appointments are after yours.  Also, if you miss three or more appointments without notifying the office, you may be dismissed from the clinic at the provider's discretion.      For prescription refill requests, have your pharmacy contact our office and allow 72 hours for refills to be completed.    Today you received the following chemotherapy and/or immunotherapy agents Keytruda.  Pembrolizumab Injection What is this medication? PEMBROLIZUMAB (PEM broe LIZ ue mab) treats some types of cancer. It works by helping your immune system slow or stop the spread of cancer cells. It is a monoclonal antibody. This medicine may be used for other purposes; ask your health care provider or pharmacist if you have questions. COMMON BRAND NAME(S): Keytruda What should I tell my care team before I take this medication? They need to know if you have any of these conditions: Allogeneic stem cell transplant (uses someone else's stem cells) Autoimmune diseases, such as Crohn disease, ulcerative colitis, lupus History of chest radiation Nervous system problems, such as Guillain-Barre syndrome, myasthenia gravis Organ transplant An unusual or allergic reaction to pembrolizumab, other medications, foods, dyes, or preservatives Pregnant or trying to get pregnant Breast-feeding How should I use this medication? This medication is injected  into a vein. It is given by your care team in a hospital or clinic setting. A special MedGuide will be given to you before each treatment. Be sure to read this information carefully each time. Talk to your care team about the use of this medication in children. While it may be prescribed for children as young as 6 months for selected conditions, precautions do apply. Overdosage: If you think you have taken too much of this medicine contact a poison control center or emergency room at once. NOTE: This medicine is only for you. Do not share this medicine with others. What if I miss a dose? Keep appointments for follow-up doses. It is important not to miss your dose. Call your care team if you are unable to keep an appointment. What may interact with this medication? Interactions have not been studied. This list may not describe all possible interactions. Give your health care provider a list of all the medicines, herbs, non-prescription drugs, or dietary supplements you use. Also tell them if you smoke, drink alcohol, or use illegal drugs. Some items may interact with your medicine. What should I watch for while using this medication? Your condition will be monitored carefully while you are receiving this medication. You may need blood work while taking this medication. This medication may cause serious skin reactions. They can happen weeks to months after starting the medication. Contact your care team right away if you notice fevers or flu-like symptoms with a rash. The rash may be red or purple and then turn into blisters or peeling of the skin. You may also notice a red rash with swelling of the face, lips, or lymph   nodes in your neck or under your arms. Tell your care team right away if you have any change in your eyesight. Talk to your care team if you may be pregnant. Serious birth defects can occur if you take this medication during pregnancy and for 4 months after the last dose. You will need a  negative pregnancy test before starting this medication. Contraception is recommended while taking this medication and for 4 months after the last dose. Your care team can help you find the option that works for you. Do not breastfeed while taking this medication and for 4 months after the last dose. What side effects may I notice from receiving this medication? Side effects that you should report to your care team as soon as possible: Allergic reactions--skin rash, itching, hives, swelling of the face, lips, tongue, or throat Dry cough, shortness of breath or trouble breathing Eye pain, redness, irritation, or discharge with blurry or decreased vision Heart muscle inflammation--unusual weakness or fatigue, shortness of breath, chest pain, fast or irregular heartbeat, dizziness, swelling of the ankles, feet, or hands Hormone gland problems--headache, sensitivity to light, unusual weakness or fatigue, dizziness, fast or irregular heartbeat, increased sensitivity to cold or heat, excessive sweating, constipation, hair loss, increased thirst or amount of urine, tremors or shaking, irritability Infusion reactions--chest pain, shortness of breath or trouble breathing, feeling faint or lightheaded Kidney injury (glomerulonephritis)--decrease in the amount of urine, red or dark brown urine, foamy or bubbly urine, swelling of the ankles, hands, or feet Liver injury--right upper belly pain, loss of appetite, nausea, light-colored stool, dark yellow or brown urine, yellowing skin or eyes, unusual weakness or fatigue Pain, tingling, or numbness in the hands or feet, muscle weakness, change in vision, confusion or trouble speaking, loss of balance or coordination, trouble walking, seizures Rash, fever, and swollen lymph nodes Redness, blistering, peeling, or loosening of the skin, including inside the mouth Sudden or severe stomach pain, bloody diarrhea, fever, nausea, vomiting Side effects that usually do not  require medical attention (report to your care team if they continue or are bothersome): Bone, joint, or muscle pain Diarrhea Fatigue Loss of appetite Nausea Skin rash This list may not describe all possible side effects. Call your doctor for medical advice about side effects. You may report side effects to FDA at 1-800-FDA-1088. Where should I keep my medication? This medication is given in a hospital or clinic. It will not be stored at home. NOTE: This sheet is a summary. It may not cover all possible information. If you have questions about this medicine, talk to your doctor, pharmacist, or health care provider.  2023 Elsevier/Gold Standard (2022-01-31 00:00:00)        To help prevent nausea and vomiting after your treatment, we encourage you to take your nausea medication as directed.  BELOW ARE SYMPTOMS THAT SHOULD BE REPORTED IMMEDIATELY: *FEVER GREATER THAN 100.4 F (38 C) OR HIGHER *CHILLS OR SWEATING *NAUSEA AND VOMITING THAT IS NOT CONTROLLED WITH YOUR NAUSEA MEDICATION *UNUSUAL SHORTNESS OF BREATH *UNUSUAL BRUISING OR BLEEDING *URINARY PROBLEMS (pain or burning when urinating, or frequent urination) *BOWEL PROBLEMS (unusual diarrhea, constipation, pain near the anus) TENDERNESS IN MOUTH AND THROAT WITH OR WITHOUT PRESENCE OF ULCERS (sore throat, sores in mouth, or a toothache) UNUSUAL RASH, SWELLING OR PAIN  UNUSUAL VAGINAL DISCHARGE OR ITCHING   Items with * indicate a potential emergency and should be followed up as soon as possible or go to the Emergency Department if any problems should occur.    Please show the CHEMOTHERAPY ALERT CARD or IMMUNOTHERAPY ALERT CARD at check-in to the Emergency Department and triage nurse.  Should you have questions after your visit or need to cancel or reschedule your appointment, please contact [facility] [phone]  and follow the prompts.  Office hours are 8:00 a.m. to 4:30 p.m. Monday - Friday. Please note  that voicemails left after 4:00 p.m. may not be returned until the following business day.  We are closed weekends and major holidays. You have access to a nurse at all times for urgent questions. Please call the main number to the clinic [phone] and follow the prompts.  For any non-urgent questions, you may also contact your provider using MyChart. We now offer e-Visits for anyone 18 and older to request care online for non-urgent symptoms. For details visit [email or web address].   Also download the MyChart app! Go to the app store, search "MyChart", open the app, select , and log in with your MyChart username and password.   

## 2023-01-17 ENCOUNTER — Other Ambulatory Visit: Payer: Self-pay

## 2023-01-17 DIAGNOSIS — C3491 Malignant neoplasm of unspecified part of right bronchus or lung: Secondary | ICD-10-CM

## 2023-01-18 ENCOUNTER — Ambulatory Visit (HOSPITAL_COMMUNITY)
Admission: RE | Admit: 2023-01-18 | Discharge: 2023-01-18 | Disposition: A | Discharge status: Home / Self Care | Payer: Medicare Other | Source: Ambulatory Visit | Attending: Hematology | Admitting: Hematology

## 2023-01-18 ENCOUNTER — Inpatient Hospital Stay: Payer: Medicare Other | Attending: Hematology

## 2023-01-18 DIAGNOSIS — C7931 Secondary malignant neoplasm of brain: Secondary | ICD-10-CM | POA: Diagnosis not present

## 2023-01-18 DIAGNOSIS — C3491 Malignant neoplasm of unspecified part of right bronchus or lung: Secondary | ICD-10-CM | POA: Diagnosis present

## 2023-01-18 DIAGNOSIS — J449 Chronic obstructive pulmonary disease, unspecified: Secondary | ICD-10-CM | POA: Diagnosis not present

## 2023-01-18 DIAGNOSIS — C773 Secondary and unspecified malignant neoplasm of axilla and upper limb lymph nodes: Secondary | ICD-10-CM | POA: Insufficient documentation

## 2023-01-18 DIAGNOSIS — R911 Solitary pulmonary nodule: Secondary | ICD-10-CM | POA: Insufficient documentation

## 2023-01-18 DIAGNOSIS — Z5112 Encounter for antineoplastic immunotherapy: Secondary | ICD-10-CM | POA: Diagnosis not present

## 2023-01-18 DIAGNOSIS — Z79620 Long term (current) use of immunosuppressive biologic: Secondary | ICD-10-CM | POA: Diagnosis not present

## 2023-01-18 DIAGNOSIS — Z79899 Other long term (current) drug therapy: Secondary | ICD-10-CM

## 2023-01-18 LAB — COMPREHENSIVE METABOLIC PANEL
ALT: 14 U/L (ref 0–44)
AST: 17 U/L (ref 15–41)
Albumin: 3.8 g/dL (ref 3.5–5.0)
Alkaline Phosphatase: 80 U/L (ref 38–126)
Anion gap: 8 (ref 5–15)
BUN: 13 mg/dL (ref 8–23)
CO2: 25 mmol/L (ref 22–32)
Calcium: 8.6 mg/dL — ABNORMAL LOW (ref 8.9–10.3)
Chloride: 97 mmol/L — ABNORMAL LOW (ref 98–111)
Creatinine, Ser: 0.88 mg/dL (ref 0.61–1.24)
GFR, Estimated: >60 mL/min (ref >60)
Glucose, Bld: 76 mg/dL (ref 70–99)
Potassium: 4.7 mmol/L (ref 3.5–5.1)
Sodium: 130 mmol/L — ABNORMAL LOW (ref 135–145)
Total Bilirubin: 0.3 mg/dL (ref 0.3–1.2)
Total Protein: 6.9 g/dL (ref 6.5–8.1)

## 2023-01-18 LAB — CBC WITH DIFFERENTIAL/PLATELET
Abs Immature Granulocytes: 0.03 10*3/uL (ref 0.00–0.07)
Basophils Absolute: 0.1 10*3/uL (ref 0.0–0.1)
Basophils Relative: 1 %
Eosinophils Absolute: 0.5 10*3/uL (ref 0.0–0.5)
Eosinophils Relative: 6 %
HCT: 41.1 % (ref 39.0–52.0)
Hemoglobin: 13.8 g/dL (ref 13.0–17.0)
Immature Granulocytes: 0 %
Lymphocytes Relative: 19 %
Lymphs Abs: 1.5 10*3/uL (ref 0.7–4.0)
MCH: 31 pg (ref 26.0–34.0)
MCHC: 33.6 g/dL (ref 30.0–36.0)
MCV: 92.4 fL (ref 80.0–100.0)
Monocytes Absolute: 0.9 10*3/uL (ref 0.1–1.0)
Monocytes Relative: 12 %
Neutro Abs: 4.8 10*3/uL (ref 1.7–7.7)
Neutrophils Relative %: 62 %
Platelets: 260 10*3/uL (ref 150–400)
RBC: 4.45 MIL/uL (ref 4.22–5.81)
RDW: 12.6 % (ref 11.5–15.5)
WBC: 7.8 10*3/uL (ref 4.0–10.5)
nRBC: 0 % (ref 0.0–0.2)

## 2023-01-18 LAB — TSH: TSH: 1.693 u[IU]/mL (ref 0.350–4.500)

## 2023-01-18 LAB — MAGNESIUM: Magnesium: 2.2 mg/dL (ref 1.7–2.4)

## 2023-01-18 MED ORDER — IOHEXOL 300 MG/ML  SOLN
75.0000 mL | Freq: Once | INTRAMUSCULAR | Status: AC | PRN
  Administered 2023-01-18: 75 mL via INTRAVENOUS

## 2023-01-24 NOTE — Progress Notes (Signed)
[facility] [address], [place] [address]    Clinic Day:  01/25/2023  Referring physician: [name], MD  Patient Care Team: Pcp, No as PCP - General [name], RN as Oncology Nurse Navigator (Oncology) [name], MD as Medical Oncologist (Medical Oncology)   ASSESSMENT & PLAN:   Assessment: 1.  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 -Mutations were negative for EGFR, ALK, ROS1, RET, BRAF V600 -PD-L1 22 C3 greater than 95%. -He was started on single agent pembrolizumab every 3 weeks under the direction of Dr. Alain Marion in [place] in August 2020, later switched to every 6 weeks. - CT chest (06/21/2022): Irregular solid 1.4 cm left lower lobe lung nodule, slightly increased suspicious for malignancy.  This has been steadily increasing for the last 1 year.  Stable spiculated solid 3.4 cm right upper lobe lung mass.  Mild left axillary and left retrocrural adenopathy stable. - PET scan (08/03/2022): Left lower lobe lung nodule 1.3 cm solid irregular with SUV 1.8.  Hypermetabolic spiculated 3.3 cm right upper lobe lung nodule, SUV 12.  Hypermetabolic left retrocrural and axillary lymph nodes.  Solitary left level 2 neck lymph node. - Right upper lobe lung mass is stable since March 2022.  Steadily increasing left lower lobe lung nodule has very low FDG uptake on PET scan. - Left axillary lymph node biopsy (08/31/2022): Metastatic poorly differentiated adenocarcinoma. - NGS: PD-L1 TPS 100%, no other targetable mutations.  TMB-low.  MSI-stable.  CDK N2A pathogenic variant.   2.  Social/family history: -He is a retired Naval architect.  Quit smoking in August 2020. -Father had prostate cancer.    Plan: 1.  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  Left axillary lymph nodes have enlarged in size.  Right upper lobe mass measuring 3.4 x 3.3 cm, previously 3.3 x 2.3 cm.  Irregular solid 1.5 cm medial left lower lobe nodule has mildly increased from 1.3 cm.  No new lesions. - At this point, we have discussed options of continuing Keytruda and see if we can treat the enlarging lung nodules and lymph nodes by radiation.  If radiation is not feasible, options include switching therapy to platinum based doublet, carboplatin and pemetrexed for 4 cycles followed by maintenance. - We will make referral to radiation oncology to get their opinion. - Today his labs: Normal LFTs and creatinine.  CBC grossly normal.  TSH is 1.6. - Proceed with pembrolizumab today.  RTC 6 weeks for follow-up.   2.  Brain metastasis: - He did not receive prior brain RT.  Last MRI of the brain on 11/21/2021 with no evidence of metastatic disease.  He is asymptomatic.  We will schedule another brain MRI.   3.  COPD: - Continue Breo Ellipta once daily and Ventolin as needed.  Symptoms well-controlled.    No orders of the defined types were placed in this encounter.     I,Katie Daubenspeck,acting as a Neurosurgeon for [name], MD.,have documented all relevant documentation on the behalf of [name], MD,as directed by  [name], MD while in the presence of [name], MD.   I, [name] MD, have reviewed the above documentation for accuracy and completeness, and I agree with the above.   [name],  MD   4/25/20244:56 PM  CHIEF COMPLAINT:   Diagnosis: metastatic right lung adenocarcinoma to brain    Cancer Staging  Adenocarcinoma of lung, stage 4, right Staging form: Lung, AJCC 8th Edition - Clinical stage from 12/08/2020: Stage IVB (cT2b, cN3, pM1c) - Signed by [name], MD on 12/08/2020    Prior Therapy: none  Current Therapy:  Keytruda every 6 weeks     HISTORY OF PRESENT ILLNESS:   Oncology History  Adenocarcinoma of lung, stage 4, right  12/08/2020 Initial Diagnosis   Adenocarcinoma of lung, stage 4, right (HCC)   12/08/2020 Cancer Staging   Staging form: Lung, AJCC 8th Edition - Clinical stage from 12/08/2020: Stage IVB (cT2b, cN3, pM1c) - Signed by [name], MD on 12/08/2020 Histopathologic type: Adenocarcinoma, NOS   12/29/2020 - 05/17/2022 Chemotherapy   Patient is on Treatment Plan : LUNG Pembrolizumab q42d     12/29/2020 -  Chemotherapy   Patient is on Treatment Plan : LUNG Pembrolizumab (400) q42d        INTERVAL HISTORY:   Leaf is a 67 y.o. male presenting to clinic today for follow up of metastatic right lung adenocarcinoma to brain. He was last seen by me on 11/01/22.  Since his last visit, he underwent restaging chest CT on 01/18/23 showing: mild increase in size of RUL lung mass, now 3.4 cm, and of LLL nodule, now 1.5 cm; interval progression of left axillary nodal metastases; stable left retropectoral nodal metastasis.  Today, he states that he is doing well overall. His appetite level is at 100%. His energy level is at 100%.  PAST MEDICAL HISTORY:   Past Medical History: Past Medical History:  Diagnosis Date   Cancer (HCC)    lung cancer     Surgical History: Past Surgical History:  Procedure Laterality Date   BACK SURGERY      Social History: Social History   Socioeconomic History   Marital status: Single    Spouse name: Not on file   Number of children: Not on file   Years of education: Not on file   Highest education level: Not on file  Occupational History   Not on file  Tobacco Use   Smoking status: Former    Types: Cigarettes    Quit date: 12/01/2019    Years since quitting: 3.1   Smokeless tobacco: Never  Substance and Sexual Activity   Alcohol use: Never   Drug use: Never   Sexual activity: Not Currently  Other Topics Concern   Not on file  Social History Narrative   Not  on file   Social Determinants of Health   Financial Resource Strain: Low Risk  (12/06/2020)   Overall Financial Resource Strain (CARDIA)    Difficulty of Paying Living Expenses: Not hard at all  Food Insecurity: No Food Insecurity (12/06/2020)   Hunger Vital Sign    Worried About Running Out of Food in the Last Year: Never true    Ran Out of Food in the Last Year: Never true  Transportation Needs: No Transportation Needs (12/06/2020)   PRAPARE - Administrator, Civil Service (Medical): No    Lack of Transportation (Non-Medical): No  Physical Activity: Sufficiently Active (12/06/2020)   Exercise Vital Sign    Days of Exercise per Week: 7 days    Minutes of Exercise per Session: 30 min  Stress: No Stress Concern Present (12/06/2020)   [name] of Occupational Health - Occupational Stress Questionnaire    Feeling  of Stress : Only a little  Social Connections: Moderately Integrated (12/06/2020)   Social Connection and Isolation Panel [NHANES]    Frequency of Communication with Friends and Family: More than three times a week    Frequency of Social Gatherings with Friends and Family: More than three times a week    Attends Religious Services: 1 to 4 times per year    Active Member of [employer] or Organizations: No    Attends Banker Meetings: 1 to 4 times per year    Marital Status: Divorced  Catering manager Violence: Not At Risk (12/06/2020)   Humiliation, Afraid, Rape, and Kick questionnaire    Fear of Current or Ex-Partner: No    Emotionally Abused: No    Physically Abused: No    Sexually Abused: No    Family History: No family history on file.  Current Medications:  Current Outpatient Medications:    albuterol (VENTOLIN HFA) 108 (90 Base) MCG/ACT inhaler, Inhale 2 puffs into the lungs every 4 (four) hours as needed for wheezing or shortness of breath., Disp: 18 g, Rfl: 1   fluticasone furoate-vilanterol (BREO ELLIPTA) 200-25 MCG/ACT AEPB, INHALE 1 PUFF INTO  THE LUNGS EVERY MORNING, Disp: 60 each, Rfl: 11   VENTOLIN HFA 108 (90 Base) MCG/ACT inhaler, INHALE 2 PUFFS INTO THE LUNGS EVERY 4 HOURS AS NEEDED FOR WHEEZING OR SHORTNESS OF BREATH, Disp: 18 g, Rfl: 1 No current facility-administered medications for this visit.  Facility-Administered Medications Ordered in Other Visits:    octreotide (SANDOSTATIN LAR) 30 MG IM injection, , , ,    Allergies: No Known Allergies  REVIEW OF SYSTEMS:   Review of Systems  Constitutional:  Negative for chills, fatigue and fever.  HENT:   Negative for lump/mass, mouth sores, nosebleeds, sore throat and trouble swallowing.   Eyes:  Negative for eye problems.  Respiratory:  Negative for cough and shortness of breath.   Cardiovascular:  Negative for chest pain, leg swelling and palpitations.  Gastrointestinal:  Negative for abdominal pain, constipation, diarrhea, nausea and vomiting.  Genitourinary:  Negative for bladder incontinence, difficulty urinating, dysuria, frequency, hematuria and nocturia.   Musculoskeletal:  Negative for arthralgias, back pain, flank pain, myalgias and neck pain.  Skin:  Negative for itching and rash.  Neurological:  Negative for dizziness, headaches and numbness.  Hematological:  Does not bruise/bleed easily.  Psychiatric/Behavioral:  Negative for depression, sleep disturbance and suicidal ideas. The patient is not nervous/anxious.   All other systems reviewed and are negative.    VITALS:   Blood pressure 134/86, pulse 95, temperature 97.9 F (36.6 C), temperature source Oral, resp. rate 18, height 5' 8.5" (1.74 m), weight 150 lb 14.4 oz (68.4 kg), SpO2 99 %.  Wt Readings from Last 3 Encounters:  01/25/23 150 lb 14.4 oz (68.4 kg)  12/14/22 152 lb 6.4 oz (69.1 kg)  11/01/22 153 lb 11.2 oz (69.7 kg)    Body mass index is 22.61 kg/m.  Performance status (ECOG): 1 - Symptomatic but completely ambulatory  PHYSICAL EXAM:   Physical Exam Vitals and nursing note reviewed.  Exam conducted with a chaperone present.  Constitutional:      Appearance: Normal appearance.  Cardiovascular:     Rate and Rhythm: Normal rate and regular rhythm.     Pulses: Normal pulses.     Heart sounds: Normal heart sounds.  Pulmonary:     Effort: Pulmonary effort is normal.     Breath sounds: Normal breath sounds.  Abdominal:  Palpations: Abdomen is soft. There is no hepatomegaly, splenomegaly or mass.     Tenderness: There is no abdominal tenderness.  Musculoskeletal:     Right lower leg: No edema.     Left lower leg: No edema.  Lymphadenopathy:     Cervical: No cervical adenopathy.     Right cervical: No superficial, deep or posterior cervical adenopathy.    Left cervical: No superficial, deep or posterior cervical adenopathy.     Upper Body:     Right upper body: No supraclavicular or axillary adenopathy.     Left upper body: No supraclavicular or axillary adenopathy.  Neurological:     General: No focal deficit present.     Mental Status: He is alert and oriented to person, place, and time.  Psychiatric:        Mood and Affect: Mood normal.        Behavior: Behavior normal.     LABS:      Latest Ref Rng & Units 01/18/2023   12:44 PM 12/14/2022   12:29 PM 11/01/2022   11:57 AM  CBC  WBC 4.0 - 10.5 K/uL 7.8  6.7  5.8   Hemoglobin 13.0 - 17.0 g/dL 16.1  09.6  04.5   Hematocrit 39.0 - 52.0 % 41.1  40.1  41.7   Platelets 150 - 400 K/uL 260  267  224       Latest Ref Rng & Units 01/18/2023   12:44 PM 12/14/2022   12:29 PM 11/01/2022   11:57 AM  CMP  Glucose 70 - 99 mg/dL 76  83  81   BUN 8 - 23 mg/dL 13  11  15    Creatinine 0.61 - 1.24 mg/dL 4.09  8.11  9.14   Sodium 135 - 145 mmol/L 130  129  132   Potassium 3.5 - 5.1 mmol/L 4.7  4.2  4.3   Chloride 98 - 111 mmol/L 97  98  96   CO2 22 - 32 mmol/L 25  24  28    Calcium 8.9 - 10.3 mg/dL 8.6  8.5  9.1   Total Protein 6.5 - 8.1 g/dL 6.9  6.7  7.2   Total Bilirubin 0.3 - 1.2 mg/dL 0.3  0.6  0.8   Alkaline  Phos 38 - 126 U/L 80  76  81   AST 15 - 41 U/L 17  20  23    ALT 0 - 44 U/L 14  15  17       No results found for: "CEA1", "CEA" / No results found for: "CEA1", "CEA" No results found for: "PSA1" No results found for: "NWG956" No results found for: "CAN125"  No results found for: "TOTALPROTELP", "ALBUMINELP", "A1GS", "A2GS", "BETS", "BETA2SER", "GAMS", "MSPIKE", "SPEI" No results found for: "TIBC", "FERRITIN", "IRONPCTSAT" No results found for: "LDH"   STUDIES:   CT Chest W Contrast  Result Date: 01/22/2023 CLINICAL DATA:  Stage IV right lung adenocarcinoma. Restaging. * Tracking Code: BO * EXAM: CT CHEST WITH CONTRAST TECHNIQUE: Multidetector CT imaging of the chest was performed during intravenous contrast administration. RADIATION DOSE REDUCTION: This exam was performed according to the departmental dose-optimization program which includes automated exposure control, adjustment of the mA and/or kV according to patient size and/or use of iterative reconstruction technique. CONTRAST:  75mL OMNIPAQUE IOHEXOL 300 MG/ML  SOLN COMPARISON:  08/03/2022 PET-CT.  06/21/2022 chest CT. FINDINGS: Cardiovascular: Normal heart size. No significant pericardial effusion/thickening. Three-vessel coronary atherosclerosis. Atherosclerotic nonaneurysmal thoracic aorta. Normal caliber pulmonary arteries. No central  pulmonary emboli. Mediastinum/Nodes: No significant thyroid nodules. Unremarkable esophagus. No right axillary adenopathy. Enlarged 1.2 cm short axis diameter left retropectoral node (series 2/image 33), previously 1.2 cm on 08/03/2022 PET-CT, unchanged. Multiple enlarged left axillary nodes have significantly increased, largest 2.4 cm (series 2/image 31), previously 1.2 cm. No pathologically enlarged mediastinal or hilar nodes. Lungs/Pleura: No pneumothorax. No pleural effusion. Moderate to severe centrilobular emphysema. Spiculated solid 3.4 x 3.3 cm peripheral right upper lobe lung mass (series 4/image  43), mildly increased from 3.3 x 2.3 cm on 08/03/2022 PET-CT. Stable tiny 0.3 cm anterior right lower lobe solid pulmonary nodule (series 4/image 110). Stable 0.5 cm solid peripheral left upper lobe pulmonary nodule (series 4/image 32). Irregular solid 1.5 cm medial left lower lobe nodule (series 4/image 70), mildly increased from 1.3 cm. No new significant pulmonary nodules. Upper abdomen: No acute abnormality. Musculoskeletal: No aggressive appearing focal osseous lesions. Mild thoracic spondylosis. IMPRESSION: 1. Spiculated solid 3.4 cm peripheral right upper lobe lung mass, mildly increased in size since 08/03/2022 PET-CT, compatible with known primary bronchogenic malignancy. 2. Irregular solid 1.5 cm medial left lower lobe nodule, mildly increased in size, compatible with growing contralateral pulmonary metastasis. 3. Interval progression of left axillary nodal metastases. Left retropectoral nodal metastasis is stable. 4. Three-vessel coronary atherosclerosis. 5. Aortic Atherosclerosis (ICD10-I70.0) and Emphysema (ICD10-J43.9). Electronically Signed   By: Delbert Phenix M.D.   On: 01/22/2023 09:43

## 2023-01-25 ENCOUNTER — Inpatient Hospital Stay: Payer: Medicare Other

## 2023-01-25 ENCOUNTER — Inpatient Hospital Stay (HOSPITAL_BASED_OUTPATIENT_CLINIC_OR_DEPARTMENT_OTHER): Payer: Medicare Other | Admitting: Hematology

## 2023-01-25 VITALS — BP 142/77 | HR 86 | Temp 97.8°F | Resp 18

## 2023-01-25 DIAGNOSIS — C3491 Malignant neoplasm of unspecified part of right bronchus or lung: Secondary | ICD-10-CM

## 2023-01-25 DIAGNOSIS — Z5112 Encounter for antineoplastic immunotherapy: Secondary | ICD-10-CM | POA: Diagnosis not present

## 2023-01-25 MED ORDER — SODIUM CHLORIDE 0.9 % IV SOLN
400.0000 mg | Freq: Once | INTRAVENOUS | Status: AC
  Administered 2023-01-25: 400 mg via INTRAVENOUS
  Filled 2023-01-25: qty 16

## 2023-01-25 MED ORDER — SODIUM CHLORIDE 0.9 % IV SOLN: Freq: Once | INTRAVENOUS | Status: AC

## 2023-01-25 NOTE — Patient Instructions (Signed)
[facility]  Discharge Instructions: Thank you for choosing [facility] to provide your oncology and hematology care.  If you have a lab appointment with the Cancer Center - please note that after April 8th, 2024, all labs will be drawn in the cancer center.  You do not have to check in or register with the main entrance as you have in the past but will complete your check-in in the cancer center.  Wear comfortable clothing and clothing appropriate for easy access to any Portacath or PICC line.   We strive to give you quality time with your provider. You may need to reschedule your appointment if you arrive late (15 or more minutes).  Arriving late affects you and other patients whose appointments are after yours.  Also, if you miss three or more appointments without notifying the office, you may be dismissed from the clinic at the provider's discretion.      For prescription refill requests, have your pharmacy contact our office and allow 72 hours for refills to be completed.    Today you received the following chemotherapy and/or immunotherapy agents Keytruda      To help prevent nausea and vomiting after your treatment, we encourage you to take your nausea medication as directed.  BELOW ARE SYMPTOMS THAT SHOULD BE REPORTED IMMEDIATELY: *FEVER GREATER THAN 100.4 F (38 C) OR HIGHER *CHILLS OR SWEATING *NAUSEA AND VOMITING THAT IS NOT CONTROLLED WITH YOUR NAUSEA MEDICATION *UNUSUAL SHORTNESS OF BREATH *UNUSUAL BRUISING OR BLEEDING *URINARY PROBLEMS (pain or burning when urinating, or frequent urination) *BOWEL PROBLEMS (unusual diarrhea, constipation, pain near the anus) TENDERNESS IN MOUTH AND THROAT WITH OR WITHOUT PRESENCE OF ULCERS (sore throat, sores in mouth, or a toothache) UNUSUAL RASH, SWELLING OR PAIN  UNUSUAL VAGINAL DISCHARGE OR ITCHING   Items with * indicate a potential emergency and should be followed up as soon as possible or go to the  Emergency Department if any problems should occur.  Please show the CHEMOTHERAPY ALERT CARD or IMMUNOTHERAPY ALERT CARD at check-in to the Emergency Department and triage nurse.  Should you have questions after your visit or need to cancel or reschedule your appointment, please contact [facility] [phone]  and follow the prompts.  Office hours are 8:00 a.m. to 4:30 p.m. Monday - Friday. Please note that voicemails left after 4:00 p.m. may not be returned until the following business day.  We are closed weekends and major holidays. You have access to a nurse at all times for urgent questions. Please call the main number to the clinic [phone] and follow the prompts.  For any non-urgent questions, you may also contact your provider using MyChart. We now offer e-Visits for anyone 18 and older to request care online for non-urgent symptoms. For details visit [email or web address].   Also download the MyChart app! Go to the app store, search "MyChart", open the app, select [facility], and log in with your MyChart username and password.   

## 2023-01-25 NOTE — Progress Notes (Signed)
Patient presents today for Keytruda infusion per providers order.  Vital signs and labs reviewed by MD.  Message received from Allison Anderson RN/Dr. Katragadda patient okay for treatment.  Treatment given today per MD orders.  Stable during infusion without adverse affects.  Vital signs stable.  No complaints at this time.  Discharge from clinic ambulatory in stable condition.  Alert and oriented X 3.  Follow up with [facility] as scheduled.  

## 2023-01-25 NOTE — Patient Instructions (Signed)
[facility] Discharge Instructions   You were seen and examined today by Dr. Ellin Saba.  He reviewed the results of your CT scan. It shows that some of the spots under your arm and the spot in your right lung have grown. Dr. Kirtland Bouchard discussed with your treating these areas with radiation and continuing the Keytruda.   We will make a referral to [facility] for radiation treatments.   We will proceed with your treatment today.  Return as scheduled.    Thank you for choosing [facility] at [facility] to provide your oncology and hematology care.  To afford each patient quality time with our provider, please arrive at least 15 minutes before your scheduled appointment time.   If you have a lab appointment with the Cancer Center please come in thru the Main Entrance and check in at the main information desk.  You need to re-schedule your appointment should you arrive 10 or more minutes late.  We strive to give you quality time with our providers, and arriving late affects you and other patients whose appointments are after yours.  Also, if you no show three or more times for appointments you may be dismissed from the clinic at the providers discretion.     Again, thank you for choosing [facility].  Our hope is that these requests will decrease the amount of time that you wait before being seen by our physicians.       _____________________________________________________________  Should you have questions after your visit to [facility], please contact our office at [phone] and follow the prompts.  Our office hours are 8:00 a.m. and 4:30 p.m. Monday - Friday.  Please note that voicemails left after 4:00 p.m. may not be returned until the following business day.  We are closed weekends and major holidays.  You do have access to a nurse 24-7, just call the main number to the clinic [phone] and do not press any  options, hold on the line and a nurse will answer the phone.    For prescription refill requests, have your pharmacy contact our office and allow 72 hours.    Due to Covid, you will need to wear a mask upon entering the hospital. If you do not have a mask, a mask will be given to you at the Main Entrance upon arrival. For doctor visits, patients may have 1 support person age 56 or older with them. For treatment visits, patients can not have anyone with them due to social distancing guidelines and our immunocompromised population.

## 2023-01-26 ENCOUNTER — Other Ambulatory Visit: Payer: Self-pay

## 2023-01-26 DIAGNOSIS — C3491 Malignant neoplasm of unspecified part of right bronchus or lung: Secondary | ICD-10-CM

## 2023-01-26 NOTE — Progress Notes (Signed)
Message received from Dr. Ellin Saba- Please order brain MRI with and without contrast for surveillance of lung cancer with brain mets. Please call and let the patient know about the scan.   MRI with and without contrast ordered per Dr. Marice Potter order. Called patient twice with no answer. Left message on answering machine to check MyChart message.

## 2023-01-29 ENCOUNTER — Other Ambulatory Visit: Payer: Self-pay

## 2023-01-31 ENCOUNTER — Other Ambulatory Visit: Payer: Self-pay

## 2023-02-09 ENCOUNTER — Other Ambulatory Visit: Payer: Self-pay | Admitting: Hematology

## 2023-02-19 ENCOUNTER — Telehealth: Payer: Self-pay | Admitting: *Deleted

## 2023-02-19 NOTE — Telephone Encounter (Signed)
Have attempted multiple times via phone an my chart to notify patient of MRI brain that has been ordered and needs to be scheduled.  Will continue to contact.

## 2023-02-20 ENCOUNTER — Other Ambulatory Visit: Payer: Self-pay

## 2023-02-26 ENCOUNTER — Other Ambulatory Visit: Payer: Self-pay

## 2023-03-03 ENCOUNTER — Other Ambulatory Visit: Payer: Self-pay

## 2023-03-06 ENCOUNTER — Ambulatory Visit (HOSPITAL_COMMUNITY): Payer: Medicare Other

## 2023-03-07 NOTE — Progress Notes (Signed)
[facility] [address] [place] [address]    Clinic Day:  03/08/2023  Referring physician: [name], MD  Patient Care Team: Pcp, No as PCP - General [name], RN as Oncology Nurse Navigator (Oncology) [name], MD as Medical Oncologist (Medical Oncology)   ASSESSMENT & PLAN:   Assessment: 1.  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 -Mutations were negative for EGFR, ALK, ROS1, RET, BRAF V600 -PD-L1 22 C3 greater than 95%. -He was started on single agent pembrolizumab every 3 weeks under the direction of Dr. Alain Marion in [place] in August 2020, later switched to every 6 weeks. - CT chest (06/21/2022): Irregular solid 1.4 cm left lower lobe lung nodule, slightly increased suspicious for malignancy.  This has been steadily increasing for the last 1 year.  Stable spiculated solid 3.4 cm right upper lobe lung mass.  Mild left axillary and left retrocrural adenopathy stable. - PET scan (08/03/2022): Left lower lobe lung nodule 1.3 cm solid irregular with SUV 1.8.  Hypermetabolic spiculated 3.3 cm right upper lobe lung nodule, SUV 12.  Hypermetabolic left retrocrural and axillary lymph nodes.  Solitary left level 2 neck lymph node. - Right upper lobe lung mass is stable since March 2022.  Steadily increasing left lower lobe lung nodule has very low FDG uptake on PET scan. - Left axillary lymph node biopsy (08/31/2022): Metastatic poorly differentiated adenocarcinoma. - NGS: PD-L1 TPS 100%, no other targetable mutations.  TMB-low.  MSI-stable.  CDK N2A pathogenic variant.   2.  Social/family history: -He is a retired Naval architect.  Quit smoking in August 2020. -Father had prostate cancer.    Plan: 1.  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  Left axillary lymph node has enlarged in size.  Right upper lobe mass measuring 3.4 x 3.3 cm previously 3.3 x 2.3 cm. - He was evaluated by Dr. Langston Masker for radiation therapy of the areas that he will progress. - Dr. Langston Masker is recommending PET CT scan.  Will have it arranged. - Reviewed labs today: Normal LFTs and CBC.  TSH is normal. - Recommend proceed with Keytruda today.  RTC 6 weeks for follow-up.   2.  Brain metastasis: - Last brain MRI was on 11/21/2021.  He will have another MRI of the brain done on this Saturday.   3.  COPD: - Continue Breo Ellipta once daily and Ventolin as needed.  Symptoms well-controlled.    Orders Placed This Encounter  Procedures   NM PET Image Restag (PS) Skull Base To Thigh    Standing Status:   Future    Standing Expiration Date:   03/07/2024    Order Specific Question:   If indicated for the ordered procedure, I authorize the administration of a radiopharmaceutical per Radiology protocol    Answer:   Yes    Order Specific Question:   Preferred imaging location?    Answer:   [facility]      I,Katie Daubenspeck,acting as a Neurosurgeon for [name], MD.,have documented all relevant documentation on the behalf of [name], MD,as directed by  [name], MD while in the presence of [name], MD.   I, [name] MD, have reviewed the above documentation for accuracy and completeness, and I agree  with the above.   [name], MD   6/6/20245:12 PM  CHIEF COMPLAINT:   Diagnosis: metastatic right lung adenocarcinoma to brain    Cancer Staging  Adenocarcinoma of lung, stage 4, right [facility]) Staging form: Lung, AJCC 8th Edition - Clinical stage from 12/08/2020: Stage IVB (cT2b, cN3, pM1c) - Signed by [name], MD on 12/08/2020    Prior Therapy: none  Current Therapy:  Keytruda    HISTORY OF PRESENT ILLNESS:   Oncology History  Adenocarcinoma of  lung, stage 4, right (HCC)  12/08/2020 Initial Diagnosis   Adenocarcinoma of lung, stage 4, right (HCC)   12/08/2020 Cancer Staging   Staging form: Lung, AJCC 8th Edition - Clinical stage from 12/08/2020: Stage IVB (cT2b, cN3, pM1c) - Signed by [name], MD on 12/08/2020 Histopathologic type: Adenocarcinoma, NOS   12/29/2020 - 05/17/2022 Chemotherapy   Patient is on Treatment Plan : LUNG Pembrolizumab q42d     12/29/2020 -  Chemotherapy   Patient is on Treatment Plan : LUNG Pembrolizumab (400) q42d        INTERVAL HISTORY:   Antonio Payne is a 67 y.o. male presenting to clinic today for follow up of metastatic right lung adenocarcinoma to brain. He was last seen by me on 01/25/23.  Today, he states that he is doing well overall. His appetite level is at 90%. His energy level is at 90%.  PAST MEDICAL HISTORY:   Past Medical History: Past Medical History:  Diagnosis Date   Cancer (HCC)    lung cancer     Surgical History: Past Surgical History:  Procedure Laterality Date   BACK SURGERY      Social History: Social History   Socioeconomic History   Marital status: Single    Spouse name: Not on file   Number of children: Not on file   Years of education: Not on file   Highest education level: Not on file  Occupational History   Not on file  Tobacco Use   Smoking status: Former    Types: Cigarettes    Quit date: 12/01/2019    Years since quitting: 3.2   Smokeless tobacco: Never  Substance and Sexual Activity   Alcohol use: Never   Drug use: Never   Sexual activity: Not Currently  Other Topics Concern   Not on file  Social History Narrative   Not on file   Social Determinants of Health   Financial Resource Strain: Low Risk  (12/06/2020)   Overall Financial Resource Strain (CARDIA)    Difficulty of Paying Living Expenses: Not hard at all  Food Insecurity: No Food Insecurity (12/06/2020)   Hunger Vital Sign    Worried About Running Out of Food in the Last Year: Never  true    Ran Out of Food in the Last Year: Never true  Transportation Needs: No Transportation Needs (12/06/2020)   PRAPARE - Administrator, Civil Service (Medical): No    Lack of Transportation (Non-Medical): No  Physical Activity: Sufficiently Active (12/06/2020)   Exercise Vital Sign    Days of Exercise per Week: 7 days    Minutes of Exercise per Session: 30 min  Stress: No Stress Concern Present (12/06/2020)   [name] of Occupational Health - Occupational Stress Questionnaire    Feeling of Stress : Only a little  Social Connections: Moderately Integrated (12/06/2020)   Social Connection and Isolation Panel [NHANES]    Frequency of Communication with Friends and Family: More than three times a week  Frequency of Social Gatherings with Friends and Family: More than three times a week    Attends Religious Services: 1 to 4 times per year    Active Member of [employer] or Organizations: No    Attends Banker Meetings: 1 to 4 times per year    Marital Status: Divorced  Catering manager Violence: Not At Risk (12/06/2020)   Humiliation, Afraid, Rape, and Kick questionnaire    Fear of Current or Ex-Partner: No    Emotionally Abused: No    Physically Abused: No    Sexually Abused: No    Family History: No family history on file.  Current Medications:  Current Outpatient Medications:    albuterol (VENTOLIN HFA) 108 (90 Base) MCG/ACT inhaler, Inhale 2 puffs into the lungs every 4 (four) hours as needed for wheezing or shortness of breath., Disp: 18 g, Rfl: 1   albuterol (VENTOLIN HFA) 108 (90 Base) MCG/ACT inhaler, INHALE 2 PUFFS INTO THE LUNGS EVERY 4 HOURS AS NEEDED FOR WHEEZING OR SHORTNESS OF BREATH, Disp: 18 g, Rfl: 6   fluticasone furoate-vilanterol (BREO ELLIPTA) 200-25 MCG/ACT AEPB, INHALE 1 PUFF INTO THE LUNGS EVERY MORNING, Disp: 60 each, Rfl: 11 No current facility-administered medications for this visit.  Facility-Administered Medications Ordered in  Other Visits:    octreotide (SANDOSTATIN LAR) 30 MG IM injection, , , ,    Allergies: No Known Allergies  REVIEW OF SYSTEMS:   Review of Systems  Constitutional:  Negative for chills, fatigue and fever.  HENT:   Negative for lump/mass, mouth sores, nosebleeds, sore throat and trouble swallowing.   Eyes:  Negative for eye problems.  Respiratory:  Negative for cough and shortness of breath.   Cardiovascular:  Negative for chest pain, leg swelling and palpitations.  Gastrointestinal:  Negative for abdominal pain, constipation, diarrhea, nausea and vomiting.  Genitourinary:  Negative for bladder incontinence, difficulty urinating, dysuria, frequency, hematuria and nocturia.   Musculoskeletal:  Negative for arthralgias, back pain, flank pain, myalgias and neck pain.  Skin:  Negative for itching and rash.  Neurological:  Negative for dizziness, headaches and numbness.  Hematological:  Does not bruise/bleed easily.  Psychiatric/Behavioral:  Negative for depression, sleep disturbance and suicidal ideas. The patient is not nervous/anxious.   All other systems reviewed and are negative.    VITALS:   Blood pressure 133/83, pulse 72, temperature (!) 96.7 F (35.9 C), temperature source Tympanic, resp. rate 16, weight 149 lb 3.2 oz (67.7 kg), SpO2 100 %.  Wt Readings from Last 3 Encounters:  03/08/23 149 lb 3.2 oz (67.7 kg)  01/25/23 150 lb 14.4 oz (68.4 kg)  12/14/22 152 lb 6.4 oz (69.1 kg)    Body mass index is 22.36 kg/m.  Performance status (ECOG): 1 - Symptomatic but completely ambulatory  PHYSICAL EXAM:   Physical Exam Vitals and nursing note reviewed. Exam conducted with a chaperone present.  Constitutional:      Appearance: Normal appearance.  Cardiovascular:     Rate and Rhythm: Normal rate and regular rhythm.     Pulses: Normal pulses.     Heart sounds: Normal heart sounds.  Pulmonary:     Effort: Pulmonary effort is normal.     Breath sounds: Normal breath sounds.   Abdominal:     Palpations: Abdomen is soft. There is no hepatomegaly, splenomegaly or mass.     Tenderness: There is no abdominal tenderness.  Musculoskeletal:     Right lower leg: No edema.     Left lower leg: No  edema.  Lymphadenopathy:     Cervical: No cervical adenopathy.     Right cervical: No superficial, deep or posterior cervical adenopathy.    Left cervical: No superficial, deep or posterior cervical adenopathy.     Upper Body:     Right upper body: No supraclavicular or axillary adenopathy.     Left upper body: No supraclavicular or axillary adenopathy.  Neurological:     General: No focal deficit present.     Mental Status: He is alert and oriented to person, place, and time.  Psychiatric:        Mood and Affect: Mood normal.        Behavior: Behavior normal.     LABS:      Latest Ref Rng & Units 03/08/2023   12:09 PM 01/18/2023   12:44 PM 12/14/2022   12:29 PM  CBC  WBC 4.0 - 10.5 K/uL 8.3  7.8  6.7   Hemoglobin 13.0 - 17.0 g/dL 16.1  09.6  04.5   Hematocrit 39.0 - 52.0 % 42.4  41.1  40.1   Platelets 150 - 400 K/uL 304  260  267       Latest Ref Rng & Units 03/08/2023   12:09 PM 01/18/2023   12:44 PM 12/14/2022   12:29 PM  CMP  Glucose 70 - 99 mg/dL 83  76  83   BUN 8 - 23 mg/dL 16  13  11    Creatinine 0.61 - 1.24 mg/dL 4.09  8.11  9.14   Sodium 135 - 145 mmol/L 131  130  129   Potassium 3.5 - 5.1 mmol/L 4.0  4.7  4.2   Chloride 98 - 111 mmol/L 97  97  98   CO2 22 - 32 mmol/L 26  25  24    Calcium 8.9 - 10.3 mg/dL 8.6  8.6  8.5   Total Protein 6.5 - 8.1 g/dL 7.4  6.9  6.7   Total Bilirubin 0.3 - 1.2 mg/dL 0.3  0.3  0.6   Alkaline Phos 38 - 126 U/L 83  80  76   AST 15 - 41 U/L 18  17  20    ALT 0 - 44 U/L 13  14  15       No results found for: "CEA1", "CEA" / No results found for: "CEA1", "CEA" No results found for: "PSA1" No results found for: "NWG956" No results found for: "CAN125"  No results found for: "TOTALPROTELP", "ALBUMINELP", "A1GS", "A2GS",  "BETS", "BETA2SER", "GAMS", "MSPIKE", "SPEI" No results found for: "TIBC", "FERRITIN", "IRONPCTSAT" No results found for: "LDH"   STUDIES:   No results found.

## 2023-03-08 ENCOUNTER — Inpatient Hospital Stay: Payer: Medicare Other | Admitting: Licensed Clinical Social Worker

## 2023-03-08 ENCOUNTER — Inpatient Hospital Stay: Payer: Medicare Other

## 2023-03-08 ENCOUNTER — Inpatient Hospital Stay (HOSPITAL_BASED_OUTPATIENT_CLINIC_OR_DEPARTMENT_OTHER): Payer: Medicare Other | Admitting: Hematology

## 2023-03-08 ENCOUNTER — Inpatient Hospital Stay: Payer: Medicare Other | Admitting: Hematology

## 2023-03-08 ENCOUNTER — Inpatient Hospital Stay: Payer: Medicare Other | Attending: Hematology

## 2023-03-08 VITALS — BP 133/83 | HR 72 | Temp 96.7°F | Resp 16 | Wt 149.2 lb

## 2023-03-08 VITALS — BP 138/74 | HR 79 | Temp 97.1°F | Resp 18

## 2023-03-08 DIAGNOSIS — Z79620 Long term (current) use of immunosuppressive biologic: Secondary | ICD-10-CM | POA: Insufficient documentation

## 2023-03-08 DIAGNOSIS — J449 Chronic obstructive pulmonary disease, unspecified: Secondary | ICD-10-CM | POA: Diagnosis not present

## 2023-03-08 DIAGNOSIS — C3491 Malignant neoplasm of unspecified part of right bronchus or lung: Secondary | ICD-10-CM | POA: Diagnosis not present

## 2023-03-08 DIAGNOSIS — C7931 Secondary malignant neoplasm of brain: Secondary | ICD-10-CM | POA: Diagnosis not present

## 2023-03-08 DIAGNOSIS — C3411 Malignant neoplasm of upper lobe, right bronchus or lung: Secondary | ICD-10-CM | POA: Diagnosis present

## 2023-03-08 DIAGNOSIS — Z5112 Encounter for antineoplastic immunotherapy: Secondary | ICD-10-CM | POA: Diagnosis present

## 2023-03-08 LAB — CBC WITH DIFFERENTIAL/PLATELET
Abs Immature Granulocytes: 0.04 10*3/uL (ref 0.00–0.07)
Basophils Absolute: 0.1 10*3/uL (ref 0.0–0.1)
Basophils Relative: 1 %
Eosinophils Absolute: 0.4 10*3/uL (ref 0.0–0.5)
Eosinophils Relative: 4 %
HCT: 42.4 % (ref 39.0–52.0)
Hemoglobin: 13.9 g/dL (ref 13.0–17.0)
Immature Granulocytes: 1 %
Lymphocytes Relative: 16 %
Lymphs Abs: 1.3 10*3/uL (ref 0.7–4.0)
MCH: 30.4 pg (ref 26.0–34.0)
MCHC: 32.8 g/dL (ref 30.0–36.0)
MCV: 92.8 fL (ref 80.0–100.0)
Monocytes Absolute: 1 10*3/uL (ref 0.1–1.0)
Monocytes Relative: 11 %
Neutro Abs: 5.6 10*3/uL (ref 1.7–7.7)
Neutrophils Relative %: 67 %
Platelets: 304 10*3/uL (ref 150–400)
RBC: 4.57 MIL/uL (ref 4.22–5.81)
RDW: 12.8 % (ref 11.5–15.5)
WBC: 8.3 10*3/uL (ref 4.0–10.5)
nRBC: 0 % (ref 0.0–0.2)

## 2023-03-08 LAB — COMPREHENSIVE METABOLIC PANEL
ALT: 13 U/L (ref 0–44)
AST: 18 U/L (ref 15–41)
Albumin: 4 g/dL (ref 3.5–5.0)
Alkaline Phosphatase: 83 U/L (ref 38–126)
Anion gap: 8 (ref 5–15)
BUN: 16 mg/dL (ref 8–23)
CO2: 26 mmol/L (ref 22–32)
Calcium: 8.6 mg/dL — ABNORMAL LOW (ref 8.9–10.3)
Chloride: 97 mmol/L — ABNORMAL LOW (ref 98–111)
Creatinine, Ser: 0.81 mg/dL (ref 0.61–1.24)
GFR, Estimated: >60 mL/min (ref >60)
Glucose, Bld: 83 mg/dL (ref 70–99)
Potassium: 4 mmol/L (ref 3.5–5.1)
Sodium: 131 mmol/L — ABNORMAL LOW (ref 135–145)
Total Bilirubin: 0.3 mg/dL (ref 0.3–1.2)
Total Protein: 7.4 g/dL (ref 6.5–8.1)

## 2023-03-08 LAB — TSH: TSH: 1.739 u[IU]/mL (ref 0.350–4.500)

## 2023-03-08 MED ORDER — SODIUM CHLORIDE 0.9 % IV SOLN
400.0000 mg | Freq: Once | INTRAVENOUS | Status: AC
  Administered 2023-03-08: 400 mg via INTRAVENOUS
  Filled 2023-03-08: qty 16

## 2023-03-08 MED ORDER — SODIUM CHLORIDE 0.9 % IV SOLN: Freq: Once | INTRAVENOUS | Status: AC

## 2023-03-08 NOTE — Progress Notes (Signed)
[facility] CSW Progress Note  Visual merchandiser met with patient to discuss financial concerns.  Pt recently had his bank account compromised and has been unable to recover the funds.  Per pt he is working with [employer] for assistance to consolidate debts.  Pt resides w/ his son and daughter-in-law who are able to provide some assistance if needed.  Pt states he had registered with [facility] and brought in the form they had sent him, but the form was never returned to him.  CSW instructed pt to contact [facility] to inform them the form has been lost and request a new form be sent through the mail as their financial assistance would be beneficial.  CSW also informed pt about the [employer] and the process to apply.  Pt referred to Marijean Niemann for supportive services.  CSW provided pt w/ contact details should he have any concerns throughout treatment.  CSW to remain available throughout duration of treatment as appropriate to provide support.      Rachel Moulds, LCSW Clinical Social Worker [facility]

## 2023-03-08 NOTE — Patient Instructions (Signed)
[facility] [place]  Discharge Instructions: Thank you for choosing [facility] to provide your oncology and hematology care.  If you have a lab appointment with the Cancer Center - please note that after April 8th, 2024, all labs will be drawn in the cancer center.  You do not have to check in or register with the main entrance as you have in the past but will complete your check-in in the cancer center.  Wear comfortable clothing and clothing appropriate for easy access to any Portacath or PICC line.   We strive to give you quality time with your provider. You may need to reschedule your appointment if you arrive late (15 or more minutes).  Arriving late affects you and other patients whose appointments are after yours.  Also, if you miss three or more appointments without notifying the office, you may be dismissed from the clinic at the provider's discretion.      For prescription refill requests, have your pharmacy contact our office and allow 72 hours for refills to be completed.    Today you received the following chemotherapy and/or immunotherapy agents Keytruda. Pembrolizumab Injection What is this medication? PEMBROLIZUMAB (PEM broe LIZ ue mab) treats some types of cancer. It works by helping your immune system slow or stop the spread of cancer cells. It is a monoclonal antibody. This medicine may be used for other purposes; ask your health care provider or pharmacist if you have questions. COMMON BRAND NAME(S): Keytruda What should I tell my care team before I take this medication? They need to know if you have any of these conditions: Allogeneic stem cell transplant (uses someone else's stem cells) Autoimmune diseases, such as Crohn disease, ulcerative colitis, lupus History of chest radiation Nervous system problems, such as Guillain-Barre syndrome, myasthenia gravis Organ transplant An unusual or allergic reaction to pembrolizumab, other medications,  foods, dyes, or preservatives Pregnant or trying to get pregnant Breast-feeding How should I use this medication? This medication is injected into a vein. It is given by your care team in a hospital or clinic setting. A special MedGuide will be given to you before each treatment. Be sure to read this information carefully each time. Talk to your care team about the use of this medication in children. While it may be prescribed for children as young as 6 months for selected conditions, precautions do apply. Overdosage: If you think you have taken too much of this medicine contact a poison control center or emergency room at once. NOTE: This medicine is only for you. Do not share this medicine with others. What if I miss a dose? Keep appointments for follow-up doses. It is important not to miss your dose. Call your care team if you are unable to keep an appointment. What may interact with this medication? Interactions have not been studied. This list may not describe all possible interactions. Give your health care provider a list of all the medicines, herbs, non-prescription drugs, or dietary supplements you use. Also tell them if you smoke, drink alcohol, or use illegal drugs. Some items may interact with your medicine. What should I watch for while using this medication? Your condition will be monitored carefully while you are receiving this medication. You may need blood work while taking this medication. This medication may cause serious skin reactions. They can happen weeks to months after starting the medication. Contact your care team right away if you notice fevers or flu-like symptoms with a rash. The rash   may be red or purple and then turn into blisters or peeling of the skin. You may also notice a red rash with swelling of the face, lips, or lymph nodes in your neck or under your arms. Tell your care team right away if you have any change in your eyesight. Talk to your care team if you  may be pregnant. Serious birth defects can occur if you take this medication during pregnancy and for 4 months after the last dose. You will need a negative pregnancy test before starting this medication. Contraception is recommended while taking this medication and for 4 months after the last dose. Your care team can help you find the option that works for you. Do not breastfeed while taking this medication and for 4 months after the last dose. What side effects may I notice from receiving this medication? Side effects that you should report to your care team as soon as possible: Allergic reactions--skin rash, itching, hives, swelling of the face, lips, tongue, or throat Dry cough, shortness of breath or trouble breathing Eye pain, redness, irritation, or discharge with blurry or decreased vision Heart muscle inflammation--unusual weakness or fatigue, shortness of breath, chest pain, fast or irregular heartbeat, dizziness, swelling of the ankles, feet, or hands Hormone gland problems--headache, sensitivity to light, unusual weakness or fatigue, dizziness, fast or irregular heartbeat, increased sensitivity to cold or heat, excessive sweating, constipation, hair loss, increased thirst or amount of urine, tremors or shaking, irritability Infusion reactions--chest pain, shortness of breath or trouble breathing, feeling faint or lightheaded Kidney injury (glomerulonephritis)--decrease in the amount of urine, red or dark Maci [place]olt urine, foamy or bubbly urine, swelling of the ankles, hands, or feet Liver injury--right upper belly pain, loss of appetite, nausea, light-colored stool, dark yellow or Anari Evitt urine, yellowing skin or eyes, unusual weakness or fatigue Pain, tingling, or numbness in the hands or feet, muscle weakness, change in vision, confusion or trouble speaking, loss of balance or coordination, trouble walking, seizures Rash, fever, and swollen lymph nodes Redness, blistering, peeling, or loosening  of the skin, including inside the mouth Sudden or severe stomach pain, bloody diarrhea, fever, nausea, vomiting Side effects that usually do not require medical attention (report to your care team if they continue or are bothersome): Bone, joint, or muscle pain Diarrhea Fatigue Loss of appetite Nausea Skin rash This list may not describe all possible side effects. Call your doctor for medical advice about side effects. You may report side effects to FDA at 1-800-FDA-1088. Where should I keep my medication? This medication is given in a hospital or clinic. It will not be stored at home. NOTE: This sheet is a summary. It may not cover all possible information. If you have questions about this medicine, talk to your doctor, pharmacist, or health care provider.  2024 Elsevier/Gold Standard (2022-01-31 00:00:00)       To help prevent nausea and vomiting after your treatment, we encourage you to take your nausea medication as directed.  BELOW ARE SYMPTOMS THAT SHOULD BE REPORTED IMMEDIATELY: *FEVER GREATER THAN 100.4 F (38 C) OR HIGHER *CHILLS OR SWEATING *NAUSEA AND VOMITING THAT IS NOT CONTROLLED WITH YOUR NAUSEA MEDICATION *UNUSUAL SHORTNESS OF BREATH *UNUSUAL BRUISING OR BLEEDING *URINARY PROBLEMS (pain or burning when urinating, or frequent urination) *BOWEL PROBLEMS (unusual diarrhea, constipation, pain near the anus) TENDERNESS IN MOUTH AND THROAT WITH OR WITHOUT PRESENCE OF ULCERS (sore throat, sores in mouth, or a toothache) UNUSUAL RASH, SWELLING OR PAIN  UNUSUAL VAGINAL DISCHARGE OR ITCHING     Items with * indicate a potential emergency and should be followed up as soon as possible or go to the Emergency Department if any problems should occur.  Please show the CHEMOTHERAPY ALERT CARD or IMMUNOTHERAPY ALERT CARD at check-in to the Emergency Department and triage nurse.  Should you have questions after your visit or need to cancel or reschedule your appointment, please contact  [facility] [phone]  and follow the prompts.  Office hours are 8:00 a.m. to 4:30 p.m. Monday - Friday. Please note that voicemails left after 4:00 p.m. may not be returned until the following business day.  We are closed weekends and major holidays. You have access to a nurse at all times for urgent questions. Please call the main number to the clinic [phone] and follow the prompts.  For any non-urgent questions, you may also contact your provider using MyChart. We now offer e-Visits for anyone 18 and older to request care online for non-urgent symptoms. For details visit [email or web address].   Also download the MyChart app! Go to the app store, search "MyChart", open the app, select [facility], and log in with your MyChart username and password.   

## 2023-03-08 NOTE — Patient Instructions (Signed)
[facility] Discharge Instructions   You were seen and examined today by Dr. Katragadda.  He reviewed the results of your lab work which are normal/stable.   We will proceed with your treatment today.  Return as scheduled.    Thank you for choosing [facility] to provide your oncology and hematology care.  To afford each patient quality time with our provider, please arrive at least 15 minutes before your scheduled appointment time.   If you have a lab appointment with the Cancer Center please come in thru the Main Entrance and check in at the main information desk.  You need to re-schedule your appointment should you arrive 10 or more minutes late.  We strive to give you quality time with our providers, and arriving late affects you and other patients whose appointments are after yours.  Also, if you no show three or more times for appointments you may be dismissed from the clinic at the providers discretion.     Again, thank you for choosing [facility].  Our hope is that these requests will decrease the amount of time that you wait before being seen by our physicians.       _____________________________________________________________  Should you have questions after your visit to [facility], please contact our office at [phone] and follow the prompts.  Our office hours are 8:00 a.m. and 4:30 p.m. Monday - Friday.  Please note that voicemails left after 4:00 p.m. may not be returned until the following business day.  We are closed weekends and major holidays.  You do have access to a nurse 24-7, just call the main number to the clinic [phone] and do not press any options, hold on the line and a nurse will answer the phone.    For prescription refill requests, have your pharmacy contact our office and allow 72 hours.    Due to Covid, you will need to wear a mask upon entering  the hospital. If you do not have a mask, a mask will be given to you at the Main Entrance upon arrival. For doctor visits, patients may have 1 support person age 18 or older with them. For treatment visits, patients can not have anyone with them due to social distancing guidelines and our immunocompromised population.      

## 2023-03-08 NOTE — Progress Notes (Signed)
Patient presents today for chemotherapy infusion. Patient is in satisfactory condition with no new complaints voiced.  Vital signs are stable.  Labs reviewed by Dr. Ellin Saba during the office visit and all labs are within treatment parameters.  IV placed in left AC.  IV flushed well with good blood return noted.  We will proceed with treatment per MD orders.   Patient tolerated treatment well with no complaints voiced.  Patient left ambulatory in stable condition.  Vital signs stable at discharge.  Follow up as scheduled.

## 2023-03-10 ENCOUNTER — Ambulatory Visit (HOSPITAL_COMMUNITY)
Admission: RE | Admit: 2023-03-10 | Discharge: 2023-03-10 | Disposition: A | Discharge status: Home / Self Care | Payer: Medicare Other | Source: Ambulatory Visit | Attending: Hematology | Admitting: Hematology

## 2023-03-10 DIAGNOSIS — C3491 Malignant neoplasm of unspecified part of right bronchus or lung: Secondary | ICD-10-CM

## 2023-03-10 MED ORDER — GADOBUTROL 1 MMOL/ML IV SOLN
6.5000 mL | Freq: Once | INTRAVENOUS | Status: AC | PRN
  Administered 2023-03-10: 6.5 mL via INTRAVENOUS

## 2023-03-29 ENCOUNTER — Ambulatory Visit (HOSPITAL_COMMUNITY)
Admission: RE | Admit: 2023-03-29 | Discharge: 2023-03-29 | Disposition: A | Discharge status: Home / Self Care | Payer: Medicare Other | Source: Ambulatory Visit | Attending: Hematology | Admitting: Hematology

## 2023-03-29 DIAGNOSIS — C3491 Malignant neoplasm of unspecified part of right bronchus or lung: Secondary | ICD-10-CM | POA: Diagnosis present

## 2023-03-29 MED ORDER — FLUDEOXYGLUCOSE F - 18 (FDG) INJECTION
7.9300 | Freq: Once | INTRAVENOUS | Status: AC | PRN
  Administered 2023-03-29: 7.93 via INTRAVENOUS

## 2023-04-18 NOTE — Progress Notes (Signed)
[facility] [address], [place] [address]    Clinic Day:  04/19/2023  Referring physician: [name], MD  Patient Care Team: Pcp, No as PCP - General [name], RN as Oncology Nurse Navigator (Oncology) [name], MD as Medical Oncologist (Medical Oncology)   ASSESSMENT & PLAN:   Assessment: 1.  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 -Mutations were negative for EGFR, ALK, ROS1, RET, BRAF V600 -PD-L1 22 C3 greater than 95%. -He was started on single agent pembrolizumab every 3 weeks under the direction of Dr. Alain Marion in [place] in August 2020, later switched to every 6 weeks. - CT chest (06/21/2022): Irregular solid 1.4 cm left lower lobe lung nodule, slightly increased suspicious for malignancy.  This has been steadily increasing for the last 1 year.  Stable spiculated solid 3.4 cm right upper lobe lung mass.  Mild left axillary and left retrocrural adenopathy stable. - PET scan (08/03/2022): Left lower lobe lung nodule 1.3 cm solid irregular with SUV 1.8.  Hypermetabolic spiculated 3.3 cm right upper lobe lung nodule, SUV 12.  Hypermetabolic left retrocrural and axillary lymph nodes.  Solitary left level 2 neck lymph node. - Right upper lobe lung mass is stable since March 2022.  Steadily increasing left lower lobe lung nodule has very low FDG uptake on PET scan. - Left axillary lymph node biopsy (08/31/2022): Metastatic poorly differentiated adenocarcinoma. - NGS: PD-L1 TPS 100%, no other targetable mutations.  TMB-low.  MSI-stable.  CDK N2A pathogenic variant.   2.  Social/family history: -He is a retired Naval architect.  Quit smoking in August 2020. -Father had prostate cancer.    Plan: 1.  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 - Reviewed MRI brain from 03/10/2023: No evidence of brain mets. - PET scan (03/29/2023): Hypermetabolic left axillary nodal metastasis increased in size and number and metabolic them.  Left level 2 neck, left retropectoral nodes, hypermetabolic right upper lobe lung mass, left lower lobe pulmonary nodule or stable.  No new disease. - We will reach out to Dr. Langston Masker for consideration of radiation to the left axillary lymph node area which has increased in size.  This will facilitate is to continue pembrolizumab since this is the only area of progression. - He does not report any immunotherapy related side effects. - Reviewed labs today: Normal LFTs and CBC.  TSH is 2.1. - Proceed with Keytruda today and in 6 weeks.  RTC 12 weeks for follow-up.    2.  Brain metastasis: - Brain MRI on 03/10/2023: No evidence of intracranial metastatic disease.   3.  COPD: - Continue Breo Ellipta once daily and Ventolin as needed.  Symptoms well-controlled.    Orders Placed This Encounter  Procedures   CBC with Differential    Standing Status:   Future    Standing Expiration Date:   07/11/2024   Comprehensive metabolic panel    Standing Status:   Future    Standing Expiration Date:   07/11/2024   T4    Standing Status:   Future    Standing Expiration Date:   07/11/2024   TSH    Standing Status:   Future    Standing Expiration Date:   07/11/2024   CBC with Differential  Standing Status:   Future    Standing Expiration Date:   08/22/2024   Comprehensive metabolic panel    Standing Status:   Future    Standing Expiration Date:   08/22/2024   T4    Standing Status:   Future    Standing Expiration Date:   08/22/2024   TSH    Standing Status:   Future    Standing Expiration Date:   08/22/2024      [name],acting as a scribe for [name], MD.,have documented all relevant documentation on the behalf of [name], MD,as  directed by  [name], MD while in the presence of [name], MD.  I, [name] MD, have reviewed the above documentation for accuracy and completeness, and I agree with the above.    [name], MD   7/18/20245:39 PM  CHIEF COMPLAINT:   Diagnosis: metastatic right lung adenocarcinoma to brain    Cancer Staging  Adenocarcinoma of lung, stage 4, right [facility]) Staging form: Lung, AJCC 8th Edition - Clinical stage from 12/08/2020: Stage IVB (cT2b, cN3, pM1c) - Signed by [name], MD on 12/08/2020    Prior Therapy: none  Current Therapy:  Keytruda    HISTORY OF PRESENT ILLNESS:   Oncology History  Adenocarcinoma of lung, stage 4, right (HCC)  12/08/2020 Initial Diagnosis   Adenocarcinoma of lung, stage 4, right (HCC)   12/08/2020 Cancer Staging   Staging form: Lung, AJCC 8th Edition - Clinical stage from 12/08/2020: Stage IVB (cT2b, cN3, pM1c) - Signed by [name], MD on 12/08/2020 Histopathologic type: Adenocarcinoma, NOS   12/29/2020 - 05/17/2022 Chemotherapy   Patient is on Treatment Plan : LUNG Pembrolizumab q42d     12/29/2020 -  Chemotherapy   Patient is on Treatment Plan : LUNG Pembrolizumab (400) q42d        INTERVAL HISTORY:   Antonio Payne is a 67 y.o. male presenting to clinic today for follow up of metastatic right lung adenocarcinoma to brain. He was last seen by me on 03/08/23.  Since his last visit he underwent a MRI of the brain on 6/8 that found no intracranial metastatic disease. He also underwent a PET from skull base to thigh on 6/27 that found hypermetabolic left axillary nodal metastases have increased in size, number and metabolism since 08/03/2022 PET-CT, left level II neck and left retropectoral nodal metastases stable, hypermetabolic right upper lobe lung mass and left lower lobe pulmonary nodule stable, no evidence of hypermetabolic metastatic disease in the abdomen, pelvis or skeleton, and no new sites of  disease.  Today, he states that he is doing well overall. His appetite level is at 100%. His energy level is at 100%.  PAST MEDICAL HISTORY:   Past Medical History: Past Medical History:  Diagnosis Date   Cancer (HCC)    lung cancer     Surgical History: Past Surgical History:  Procedure Laterality Date   BACK SURGERY      Social History: Social History   Socioeconomic History   Marital status: Single    Spouse name: Not on file   Number of children: Not on file   Years of education: Not on file   Highest education level: Not on file  Occupational History   Not on file  Tobacco Use   Smoking status: Former    Current packs/day: 0.00    Types: Cigarettes    Quit date: 12/01/2019    Years since quitting: 3.3   Smokeless tobacco: Never  Substance and Sexual Activity   Alcohol use: Never   Drug use: Never   Sexual activity: Not Currently  Other Topics Concern   Not on file  Social History Narrative   Not on file   Social Determinants of Health   Financial Resource Strain: Low Risk  (12/06/2020)   Overall Financial Resource Strain (CARDIA)    Difficulty of Paying Living Expenses: Not hard at all  Food Insecurity: No Food Insecurity (12/06/2020)   Hunger Vital Sign    Worried About Running Out of Food in the Last Year: Never true    Ran Out of Food in the Last Year: Never true  Transportation Needs: No Transportation Needs (12/06/2020)   PRAPARE - Administrator, Civil Service (Medical): No    Lack of Transportation (Non-Medical): No  Physical Activity: Sufficiently Active (12/06/2020)   Exercise Vital Sign    Days of Exercise per Week: 7 days    Minutes of Exercise per Session: 30 min  Stress: No Stress Concern Present (12/06/2020)   [name] of Occupational Health - Occupational Stress Questionnaire    Feeling of Stress : Only a little  Social Connections: Moderately Integrated (12/06/2020)   Social Connection and Isolation Panel [NHANES]     Frequency of Communication with Friends and Family: More than three times a week    Frequency of Social Gatherings with Friends and Family: More than three times a week    Attends Religious Services: 1 to 4 times per year    Active Member of [employer] or Organizations: No    Attends Banker Meetings: 1 to 4 times per year    Marital Status: Divorced  Catering manager Violence: Not At Risk (12/06/2020)   Humiliation, Afraid, Rape, and Kick questionnaire    Fear of Current or Ex-Partner: No    Emotionally Abused: No    Physically Abused: No    Sexually Abused: No    Family History: History reviewed. No pertinent family history.  Current Medications:  Current Outpatient Medications:    albuterol (VENTOLIN HFA) 108 (90 Base) MCG/ACT inhaler, Inhale 2 puffs into the lungs every 4 (four) hours as needed for wheezing or shortness of breath., Disp: 18 g, Rfl: 1   fluticasone furoate-vilanterol (BREO ELLIPTA) 200-25 MCG/ACT AEPB, INHALE 1 PUFF INTO THE LUNGS EVERY MORNING, Disp: 60 each, Rfl: 11 No current facility-administered medications for this visit.  Facility-Administered Medications Ordered in Other Visits:    octreotide (SANDOSTATIN LAR) 30 MG IM injection, , , ,    Allergies: No Known Allergies  REVIEW OF SYSTEMS:   Review of Systems  Constitutional:  Negative for chills, fatigue and fever.  HENT:   Negative for lump/mass, mouth sores, nosebleeds, sore throat and trouble swallowing.   Eyes:  Negative for eye problems.  Respiratory:  Negative for cough and shortness of breath.   Cardiovascular:  Negative for chest pain, leg swelling and palpitations.  Gastrointestinal:  Negative for abdominal pain, constipation, diarrhea, nausea and vomiting.  Genitourinary:  Negative for bladder incontinence, difficulty urinating, dysuria, frequency, hematuria and nocturia.   Musculoskeletal:  Negative for arthralgias, back pain, flank pain, myalgias and neck pain.  Skin:  Negative for  itching and rash.  Neurological:  Negative for dizziness, headaches and numbness.  Hematological:  Does not bruise/bleed easily.  Psychiatric/Behavioral:  Negative for depression, sleep disturbance and suicidal ideas. The patient is not nervous/anxious.   All other systems reviewed and are negative.    VITALS:  Blood pressure 117/74, pulse 83, temperature 97.6 F (36.4 C), resp. rate 18, weight 150 lb 3.2 oz (68.1 kg), SpO2 100%.  Wt Readings from Last 3 Encounters:  04/19/23 150 lb 3.2 oz (68.1 kg)  03/08/23 149 lb 3.2 oz (67.7 kg)  01/25/23 150 lb 14.4 oz (68.4 kg)    Body mass index is 22.51 kg/m.  Performance status (ECOG): 1 - Symptomatic but completely ambulatory  PHYSICAL EXAM:   Physical Exam Vitals and nursing note reviewed. Exam conducted with a chaperone present.  Constitutional:      Appearance: Normal appearance.  Cardiovascular:     Rate and Rhythm: Normal rate and regular rhythm.     Pulses: Normal pulses.     Heart sounds: Normal heart sounds.  Pulmonary:     Effort: Pulmonary effort is normal.     Breath sounds: Normal breath sounds.  Abdominal:     Palpations: Abdomen is soft. There is no hepatomegaly, splenomegaly or mass.     Tenderness: There is no abdominal tenderness.  Musculoskeletal:     Right lower leg: No edema.     Left lower leg: No edema.  Lymphadenopathy:     Cervical: No cervical adenopathy.     Right cervical: No superficial, deep or posterior cervical adenopathy.    Left cervical: No superficial, deep or posterior cervical adenopathy.     Upper Body:     Right upper body: No supraclavicular or axillary adenopathy.     Left upper body: No supraclavicular or axillary adenopathy.  Neurological:     General: No focal deficit present.     Mental Status: He is alert and oriented to person, place, and time.  Psychiatric:        Mood and Affect: Mood normal.        Behavior: Behavior normal.     LABS:      Latest Ref Rng & Units  04/19/2023   12:23 PM 03/08/2023   12:09 PM 01/18/2023   12:44 PM  CBC  WBC 4.0 - 10.5 K/uL 7.9  8.3  7.8   Hemoglobin 13.0 - 17.0 g/dL 16.1  09.6  04.5   Hematocrit 39.0 - 52.0 % 38.6  42.4  41.1   Platelets 150 - 400 K/uL 313  304  260       Latest Ref Rng & Units 04/19/2023   12:23 PM 03/08/2023   12:09 PM 01/18/2023   12:44 PM  CMP  Glucose 70 - 99 mg/dL 409  83  76   BUN 8 - 23 mg/dL 12  16  13    Creatinine 0.61 - 1.24 mg/dL 8.11  9.14  7.82   Sodium 135 - 145 mmol/L 128  131  130   Potassium 3.5 - 5.1 mmol/L 4.0  4.0  4.7   Chloride 98 - 111 mmol/L 95  97  97   CO2 22 - 32 mmol/L 27  26  25    Calcium 8.9 - 10.3 mg/dL 8.5  8.6  8.6   Total Protein 6.5 - 8.1 g/dL 7.4  7.4  6.9   Total Bilirubin 0.3 - 1.2 mg/dL 0.6  0.3  0.3   Alkaline Phos 38 - 126 U/L 83  83  80   AST 15 - 41 U/L 17  18  17    ALT 0 - 44 U/L 14  13  14       No results found for: "CEA1", "CEA" / No results found for: "CEA1", "CEA" No results found  for: "PSA1" No results found for: "CAN199" No results found for: "CAN125"  No results found for: "TOTALPROTELP", "ALBUMINELP", "A1GS", "A2GS", "BETS", "BETA2SER", "GAMS", "MSPIKE", "SPEI" No results found for: "TIBC", "FERRITIN", "IRONPCTSAT" No results found for: "LDH"   STUDIES:   NM PET Image Restag (PS) Skull Base To Thigh  Result Date: 04/02/2023 CLINICAL DATA:  Subsequent treatment strategy for stage IV lung adenocarcinoma. EXAM: NUCLEAR MEDICINE PET SKULL BASE TO THIGH TECHNIQUE: 7.9 mCi F-18 FDG was injected intravenously. Full-ring PET imaging was performed from the skull base to thigh after the radiotracer. CT data was obtained and used for attenuation correction and anatomic localization. Fasting blood glucose: 113 mg/dl COMPARISON:  16/07/9603 chest CT.  08/03/2022 PET-CT. FINDINGS: Mediastinal blood pool activity: SUV max 1.9 Liver activity: SUV max NA NECK: Hypermetabolic 0.9 cm left level 2 neck lymph node with max SUV 5.1 (series 3/image 65),  previously 0.9 cm with max SUV 4.5 on 08/03/2022 PET-CT, not substantially changed. No additional enlarged or hypermetabolic lymph nodes in the neck. Incidental CT findings: Small non hypermetabolic superficial cystic lesions in the anterior left frontal scalp (series 3/image 1) and lateral left face (series 3/image 35), compatible with sebaceous cysts. CHEST: Multiple enlarged and hypermetabolic left axillary lymph nodes, increased in size and metabolism since 08/07/2022 PET-CT. Representative 3.3 cm short axis diameter left axillary node with max SUV 16.0 (series 3/image 102), previously 1.6 cm with max SUV 5.6. Hypermetabolic 1.4 cm left retropectoral node with max SUV 5.8 (series 3/image 95), previously 1.3 cm with max SUV 5.6, not substantially changed. No enlarged or hypermetabolic right axillary nodes. No enlarged or hypermetabolic mediastinal or hilar nodes. Hypermetabolic spiculated solid 3.1 x 2.2 cm right upper lobe lung mass with max SUV 11.9 (series 7/image 50), previously 3.3 x 2.3 cm with max SUV 12.0, not substantially changed. Irregular 1.4 cm medial superior segment left lower lobe pulmonary nodule with low level FDG uptake with max SUV 1.9 (series 7/image 64), previously 1.3 cm with max SUV 1.8, not substantially changed. Tiny 0.4 cm apical left upper lobe nodule (series 7/image 41) is below PET resolution and unchanged. Incidental CT findings: Coronary atherosclerosis. Atherosclerotic nonaneurysmal thoracic aorta. Severe centrilobular emphysema. ABDOMEN/PELVIS: No abnormal hypermetabolic activity within the liver, pancreas, adrenal glands, or spleen. No hypermetabolic lymph nodes in the abdomen or pelvis. Incidental CT findings: Atherosclerotic nonaneurysmal abdominal aorta. SKELETON: No focal hypermetabolic activity to suggest skeletal metastasis. Incidental CT findings: None. IMPRESSION: 1. Hypermetabolic left axillary nodal metastases have increased in size, number and metabolism since  08/03/2022 PET-CT. 2. Left level II neck and left retropectoral nodal metastases are stable. 3. Hypermetabolic right upper lobe lung mass and left lower lobe pulmonary nodule are stable. 4. No evidence of hypermetabolic metastatic disease in the abdomen, pelvis or skeleton. No new sites of disease. 5. Aortic Atherosclerosis (ICD10-I70.0) and Emphysema (ICD10-J43.9). Electronically Signed   By: Delbert Phenix M.D.   On: 04/02/2023 15:18

## 2023-04-19 ENCOUNTER — Inpatient Hospital Stay (HOSPITAL_BASED_OUTPATIENT_CLINIC_OR_DEPARTMENT_OTHER): Payer: Medicare Other | Admitting: Hematology

## 2023-04-19 ENCOUNTER — Inpatient Hospital Stay: Payer: Medicare Other | Attending: Hematology

## 2023-04-19 ENCOUNTER — Encounter: Payer: Self-pay | Admitting: Hematology

## 2023-04-19 ENCOUNTER — Inpatient Hospital Stay: Payer: Medicare Other

## 2023-04-19 VITALS — BP 121/65 | HR 91 | Temp 97.5°F | Resp 18

## 2023-04-19 VITALS — BP 117/74 | HR 83 | Temp 97.6°F | Resp 18 | Wt 150.2 lb

## 2023-04-19 DIAGNOSIS — Z79620 Long term (current) use of immunosuppressive biologic: Secondary | ICD-10-CM | POA: Insufficient documentation

## 2023-04-19 DIAGNOSIS — C3491 Malignant neoplasm of unspecified part of right bronchus or lung: Secondary | ICD-10-CM

## 2023-04-19 DIAGNOSIS — C7931 Secondary malignant neoplasm of brain: Secondary | ICD-10-CM | POA: Diagnosis not present

## 2023-04-19 DIAGNOSIS — Z5112 Encounter for antineoplastic immunotherapy: Secondary | ICD-10-CM | POA: Diagnosis present

## 2023-04-19 DIAGNOSIS — J449 Chronic obstructive pulmonary disease, unspecified: Secondary | ICD-10-CM | POA: Insufficient documentation

## 2023-04-19 DIAGNOSIS — C3411 Malignant neoplasm of upper lobe, right bronchus or lung: Secondary | ICD-10-CM | POA: Insufficient documentation

## 2023-04-19 LAB — CBC WITH DIFFERENTIAL/PLATELET
Abs Immature Granulocytes: 0.02 10*3/uL (ref 0.00–0.07)
Basophils Absolute: 0.1 10*3/uL (ref 0.0–0.1)
Basophils Relative: 1 %
Eosinophils Absolute: 0.5 10*3/uL (ref 0.0–0.5)
Eosinophils Relative: 7 %
HCT: 38.6 % — ABNORMAL LOW (ref 39.0–52.0)
Hemoglobin: 12.8 g/dL — ABNORMAL LOW (ref 13.0–17.0)
Immature Granulocytes: 0 %
Lymphocytes Relative: 15 %
Lymphs Abs: 1.2 10*3/uL (ref 0.7–4.0)
MCH: 30.2 pg (ref 26.0–34.0)
MCHC: 33.2 g/dL (ref 30.0–36.0)
MCV: 91 fL (ref 80.0–100.0)
Monocytes Absolute: 0.7 10*3/uL (ref 0.1–1.0)
Monocytes Relative: 9 %
Neutro Abs: 5.4 10*3/uL (ref 1.7–7.7)
Neutrophils Relative %: 68 %
Platelets: 313 10*3/uL (ref 150–400)
RBC: 4.24 MIL/uL (ref 4.22–5.81)
RDW: 12.3 % (ref 11.5–15.5)
WBC: 7.9 10*3/uL (ref 4.0–10.5)
nRBC: 0 % (ref 0.0–0.2)

## 2023-04-19 LAB — COMPREHENSIVE METABOLIC PANEL
ALT: 14 U/L (ref 0–44)
AST: 17 U/L (ref 15–41)
Albumin: 3.9 g/dL (ref 3.5–5.0)
Alkaline Phosphatase: 83 U/L (ref 38–126)
Anion gap: 6 (ref 5–15)
BUN: 12 mg/dL (ref 8–23)
CO2: 27 mmol/L (ref 22–32)
Calcium: 8.5 mg/dL — ABNORMAL LOW (ref 8.9–10.3)
Chloride: 95 mmol/L — ABNORMAL LOW (ref 98–111)
Creatinine, Ser: 0.86 mg/dL (ref 0.61–1.24)
GFR, Estimated: >60 mL/min (ref >60)
Glucose, Bld: 131 mg/dL — ABNORMAL HIGH (ref 70–99)
Potassium: 4 mmol/L (ref 3.5–5.1)
Sodium: 128 mmol/L — ABNORMAL LOW (ref 135–145)
Total Bilirubin: 0.6 mg/dL (ref 0.3–1.2)
Total Protein: 7.4 g/dL (ref 6.5–8.1)

## 2023-04-19 LAB — TSH: TSH: 2.175 u[IU]/mL (ref 0.350–4.500)

## 2023-04-19 MED ORDER — SODIUM CHLORIDE 0.9 % IV SOLN
400.0000 mg | Freq: Once | INTRAVENOUS | Status: AC
  Administered 2023-04-19: 400 mg via INTRAVENOUS
  Filled 2023-04-19: qty 16

## 2023-04-19 MED ORDER — SODIUM CHLORIDE 0.9 % IV SOLN: Freq: Once | INTRAVENOUS | Status: AC

## 2023-04-19 NOTE — Patient Instructions (Addendum)
[facility] at [facility] Discharge Instructions   You were seen and examined today by Dr. Ellin Saba.  He reviewed the results of your lab work which are normal/stable.   He reviewed the results of your PET scan and brain MRI. The brain MRI was clear of any evidence of cancer. The PET scan is showing mostly stable lymph nodes and stability of the lung mass. The lymph nodes under your left arm have grown slightly since your last PET in November. We will refer you back to [facility] radiation oncology for treatment of the lymph node under the left arm.   We will proceed with your treatment today.   Return as scheduled.    Thank you for choosing [facility] at [facility] to provide your oncology and hematology care.  To afford each patient quality time with our provider, please arrive at least 15 minutes before your scheduled appointment time.   If you have a lab appointment with the Cancer Center please come in thru the Main Entrance and check in at the main information desk.  You need to re-schedule your appointment should you arrive 10 or more minutes late.  We strive to give you quality time with our providers, and arriving late affects you and other patients whose appointments are after yours.  Also, if you no show three or more times for appointments you may be dismissed from the clinic at the providers discretion.     Again, thank you for choosing [facility].  Our hope is that these requests will decrease the amount of time that you wait before being seen by our physicians.       _____________________________________________________________  Should you have questions after your visit to [facility], please contact our office at [phone] and follow the prompts.  Our office hours are 8:00 a.m. and 4:30 p.m. Monday - Friday.  Please note that voicemails left after 4:00 p.m. may not be returned until the following  business day.  We are closed weekends and major holidays.  You do have access to a nurse 24-7, just call the main number to the clinic [phone] and do not press any options, hold on the line and a nurse will answer the phone.    For prescription refill requests, have your pharmacy contact our office and allow 72 hours.    Due to Covid, you will need to wear a mask upon entering the hospital. If you do not have a mask, a mask will be given to you at the Main Entrance upon arrival. For doctor visits, patients may have 1 support person age 13 or older with them. For treatment visits, patients can not have anyone with them due to social distancing guidelines and our immunocompromised population.

## 2023-04-19 NOTE — Progress Notes (Signed)
Patient tolerated therapy with no complaints voiced.  Side effects with management reviewed with understanding verbalized.  Port site clean and dry with no bruising or swelling noted at site.  Good blood return noted before and after administration of therapy.  Band aid applied.  Patient left in satisfactory condition with VSS and no s/s of distress noted.  

## 2023-04-19 NOTE — Progress Notes (Signed)
Patient presents today for Keytruda infusion per providers order.  Vital signs and labs reviewed by MD.  Message received from Chapman Moss RN/Dr. Ellin Saba patient okay for treatment.  Treatment given today per MD orders.  Keytruda infusion without adverse affects.  Vital signs stable.  No complaints at this time.  Discharge from clinic ambulatory in stable condition.  Alert and oriented X 3.  Follow up with [facility] as scheduled.

## 2023-04-19 NOTE — Patient Instructions (Signed)
[facility] [place]  Discharge Instructions: Thank you for choosing [facility] to provide your oncology and hematology care.  If you have a lab appointment with the Cancer Center - please note that after April 8th, 2024, all labs will be drawn in the cancer center.  You do not have to check in or register with the main entrance as you have in the past but will complete your check-in in the cancer center.  Wear comfortable clothing and clothing appropriate for easy access to any Portacath or PICC line.   We strive to give you quality time with your provider. You may need to reschedule your appointment if you arrive late (15 or more minutes).  Arriving late affects you and other patients whose appointments are after yours.  Also, if you miss three or more appointments without notifying the office, you may be dismissed from the clinic at the provider's discretion.      For prescription refill requests, have your pharmacy contact our office and allow 72 hours for refills to be completed.    Today you received the following chemotherapy and/or immunotherapy agents Keytruda      To help prevent nausea and vomiting after your treatment, we encourage you to take your nausea medication as directed.  BELOW ARE SYMPTOMS THAT SHOULD BE REPORTED IMMEDIATELY: *FEVER GREATER THAN 100.4 F (38 C) OR HIGHER *CHILLS OR SWEATING *NAUSEA AND VOMITING THAT IS NOT CONTROLLED WITH YOUR NAUSEA MEDICATION *UNUSUAL SHORTNESS OF BREATH *UNUSUAL BRUISING OR BLEEDING *URINARY PROBLEMS (pain or burning when urinating, or frequent urination) *BOWEL PROBLEMS (unusual diarrhea, constipation, pain near the anus) TENDERNESS IN MOUTH AND THROAT WITH OR WITHOUT PRESENCE OF ULCERS (sore throat, sores in mouth, or a toothache) UNUSUAL RASH, SWELLING OR PAIN  UNUSUAL VAGINAL DISCHARGE OR ITCHING   Items with * indicate a potential emergency and should be followed up as soon as possible or go to the  Emergency Department if any problems should occur.  Please show the CHEMOTHERAPY ALERT CARD or IMMUNOTHERAPY ALERT CARD at check-in to the Emergency Department and triage nurse.  Should you have questions after your visit or need to cancel or reschedule your appointment, please contact [facility] [place] [place] [phone]  and follow the prompts.  Office hours are 8:00 a.m. to 4:30 p.m. Monday - Friday. Please note that voicemails left after 4:00 p.m. may not be returned until the following business day.  We are closed weekends and major holidays. You have access to a nurse at all times for urgent questions. Please call the main number to the clinic [phone] and follow the prompts.  For any non-urgent questions, you may also contact your provider using MyChart. We now offer e-Visits for anyone 18 and older to request care online for non-urgent symptoms. For details visit [email or web address].   Also download the MyChart app! Go to the app store, search "MyChart", open the app, select [place], and log in with your MyChart username and password.   

## 2023-04-21 LAB — T4: T4, Total: 8.7 ug/dL (ref 4.5–12.0)

## 2023-04-23 ENCOUNTER — Other Ambulatory Visit: Payer: Self-pay

## 2023-04-25 ENCOUNTER — Other Ambulatory Visit: Payer: Self-pay

## 2023-05-08 ENCOUNTER — Telehealth: Payer: Self-pay | Admitting: Hematology

## 2023-05-31 ENCOUNTER — Inpatient Hospital Stay: Payer: Medicare Other | Attending: Hematology

## 2023-05-31 ENCOUNTER — Inpatient Hospital Stay: Payer: Medicare Other

## 2023-05-31 VITALS — BP 128/95 | HR 75 | Temp 97.3°F | Resp 18 | Wt 146.6 lb

## 2023-05-31 DIAGNOSIS — Z79620 Long term (current) use of immunosuppressive biologic: Secondary | ICD-10-CM | POA: Insufficient documentation

## 2023-05-31 DIAGNOSIS — C3491 Malignant neoplasm of unspecified part of right bronchus or lung: Secondary | ICD-10-CM

## 2023-05-31 DIAGNOSIS — C3411 Malignant neoplasm of upper lobe, right bronchus or lung: Secondary | ICD-10-CM | POA: Insufficient documentation

## 2023-05-31 DIAGNOSIS — Z5112 Encounter for antineoplastic immunotherapy: Secondary | ICD-10-CM | POA: Diagnosis present

## 2023-05-31 LAB — CBC WITH DIFFERENTIAL/PLATELET
Abs Immature Granulocytes: 0.03 10*3/uL (ref 0.00–0.07)
Basophils Absolute: 0.1 10*3/uL (ref 0.0–0.1)
Basophils Relative: 1 %
Eosinophils Absolute: 0.4 10*3/uL (ref 0.0–0.5)
Eosinophils Relative: 6 %
HCT: 38.1 % — ABNORMAL LOW (ref 39.0–52.0)
Hemoglobin: 12.5 g/dL — ABNORMAL LOW (ref 13.0–17.0)
Immature Granulocytes: 1 %
Lymphocytes Relative: 10 %
Lymphs Abs: 0.6 10*3/uL — ABNORMAL LOW (ref 0.7–4.0)
MCH: 29.9 pg (ref 26.0–34.0)
MCHC: 32.8 g/dL (ref 30.0–36.0)
MCV: 91.1 fL (ref 80.0–100.0)
Monocytes Absolute: 0.8 10*3/uL (ref 0.1–1.0)
Monocytes Relative: 13 %
Neutro Abs: 4.3 10*3/uL (ref 1.7–7.7)
Neutrophils Relative %: 69 %
Platelets: 277 10*3/uL (ref 150–400)
RBC: 4.18 MIL/uL — ABNORMAL LOW (ref 4.22–5.81)
RDW: 13.2 % (ref 11.5–15.5)
WBC: 6.2 10*3/uL (ref 4.0–10.5)
nRBC: 0 % (ref 0.0–0.2)

## 2023-05-31 LAB — COMPREHENSIVE METABOLIC PANEL
ALT: 14 U/L (ref 0–44)
AST: 17 U/L (ref 15–41)
Albumin: 3.8 g/dL (ref 3.5–5.0)
Alkaline Phosphatase: 74 U/L (ref 38–126)
Anion gap: 6 (ref 5–15)
BUN: 12 mg/dL (ref 8–23)
CO2: 28 mmol/L (ref 22–32)
Calcium: 8.6 mg/dL — ABNORMAL LOW (ref 8.9–10.3)
Chloride: 95 mmol/L — ABNORMAL LOW (ref 98–111)
Creatinine, Ser: 0.79 mg/dL (ref 0.61–1.24)
GFR, Estimated: >60 mL/min (ref >60)
Glucose, Bld: 83 mg/dL (ref 70–99)
Potassium: 4.1 mmol/L (ref 3.5–5.1)
Sodium: 129 mmol/L — ABNORMAL LOW (ref 135–145)
Total Bilirubin: 0.6 mg/dL (ref 0.3–1.2)
Total Protein: 6.7 g/dL (ref 6.5–8.1)

## 2023-05-31 LAB — TSH: TSH: 1.363 u[IU]/mL (ref 0.350–4.500)

## 2023-05-31 MED ORDER — SODIUM CHLORIDE 0.9 % IV SOLN
400.0000 mg | Freq: Once | INTRAVENOUS | Status: AC
  Administered 2023-05-31: 400 mg via INTRAVENOUS
  Filled 2023-05-31: qty 16

## 2023-05-31 MED ORDER — SODIUM CHLORIDE 0.9 % IV SOLN: Freq: Once | INTRAVENOUS | Status: AC

## 2023-05-31 NOTE — Progress Notes (Signed)
Patient presents today for chemotherapy infusion.  Patient is in satisfactory condition with no new complaints voiced.  Vital signs are stable.  Labs reviewed and all labs are within treatment parameters.  We will proceed with treatment per MD orders.

## 2023-05-31 NOTE — Progress Notes (Signed)
Treatment given today per MD orders.  Stable during infusion without adverse affects.  Vital signs stable.  No complaints at this time.  Discharge from clinic ambulatory in stable condition.  Alert and oriented X 3.  Follow up with [facility] as scheduled.  

## 2023-05-31 NOTE — Patient Instructions (Signed)
[facility]  Discharge Instructions: Thank you for choosing [facility] to provide your oncology and hematology care.  If you have a lab appointment with the Cancer Center - please note that after April 8th, 2024, all labs will be drawn in the cancer center.  You do not have to check in or register with the main entrance as you have in the past but will complete your check-in in the cancer center.  Wear comfortable clothing and clothing appropriate for easy access to any Portacath or PICC line.   We strive to give you quality time with your provider. You may need to reschedule your appointment if you arrive late (15 or more minutes).  Arriving late affects you and other patients whose appointments are after yours.  Also, if you miss three or more appointments without notifying the office, you may be dismissed from the clinic at the provider's discretion.      For prescription refill requests, have your pharmacy contact our office and allow 72 hours for refills to be completed.    Today you received the following chemotherapy and/or immunotherapy agents Keytruda. Pembrolizumab Injection What is this medication? PEMBROLIZUMAB (PEM broe LIZ ue mab) treats some types of cancer. It works by helping your immune system slow or stop the spread of cancer cells. It is a monoclonal antibody. This medicine may be used for other purposes; ask your health care provider or pharmacist if you have questions. COMMON BRAND NAME(S): Keytruda What should I tell my care team before I take this medication? They need to know if you have any of these conditions: Allogeneic stem cell transplant (uses someone else's stem cells) Autoimmune diseases, such as Crohn disease, ulcerative colitis, lupus History of chest radiation Nervous system problems, such as Guillain-Barre syndrome, myasthenia gravis Organ transplant An unusual or allergic reaction to pembrolizumab, other medications,  foods, dyes, or preservatives Pregnant or trying to get pregnant Breast-feeding How should I use this medication? This medication is injected into a vein. It is given by your care team in a hospital or clinic setting. A special MedGuide will be given to you before each treatment. Be sure to read this information carefully each time. Talk to your care team about the use of this medication in children. While it may be prescribed for children as young as 6 months for selected conditions, precautions do apply. Overdosage: If you think you have taken too much of this medicine contact a poison control center or emergency room at once. NOTE: This medicine is only for you. Do not share this medicine with others. What if I miss a dose? Keep appointments for follow-up doses. It is important not to miss your dose. Call your care team if you are unable to keep an appointment. What may interact with this medication? Interactions have not been studied. This list may not describe all possible interactions. Give your health care provider a list of all the medicines, herbs, non-prescription drugs, or dietary supplements you use. Also tell them if you smoke, drink alcohol, or use illegal drugs. Some items may interact with your medicine. What should I watch for while using this medication? Your condition will be monitored carefully while you are receiving this medication. You may need blood work while taking this medication. This medication may cause serious skin reactions. They can happen weeks to months after starting the medication. Contact your care team right away if you notice fevers or flu-like symptoms with a rash. The rash   may be red or purple and then turn into blisters or peeling of the skin. You may also notice a red rash with swelling of the face, lips, or lymph nodes in your neck or under your arms. Tell your care team right away if you have any change in your eyesight. Talk to your care team if you  may be pregnant. Serious birth defects can occur if you take this medication during pregnancy and for 4 months after the last dose. You will need a negative pregnancy test before starting this medication. Contraception is recommended while taking this medication and for 4 months after the last dose. Your care team can help you find the option that works for you. Do not breastfeed while taking this medication and for 4 months after the last dose. What side effects may I notice from receiving this medication? Side effects that you should report to your care team as soon as possible: Allergic reactions--skin rash, itching, hives, swelling of the face, lips, tongue, or throat Dry cough, shortness of breath or trouble breathing Eye pain, redness, irritation, or discharge with blurry or decreased vision Heart muscle inflammation--unusual weakness or fatigue, shortness of breath, chest pain, fast or irregular heartbeat, dizziness, swelling of the ankles, feet, or hands Hormone gland problems--headache, sensitivity to light, unusual weakness or fatigue, dizziness, fast or irregular heartbeat, increased sensitivity to cold or heat, excessive sweating, constipation, hair loss, increased thirst or amount of urine, tremors or shaking, irritability Infusion reactions--chest pain, shortness of breath or trouble breathing, feeling faint or lightheaded Kidney injury (glomerulonephritis)--decrease in the amount of urine, red or dark brown urine, foamy or bubbly urine, swelling of the ankles, hands, or feet Liver injury--right upper belly pain, loss of appetite, nausea, light-colored stool, dark yellow or brown urine, yellowing skin or eyes, unusual weakness or fatigue Pain, tingling, or numbness in the hands or feet, muscle weakness, change in vision, confusion or trouble speaking, loss of balance or coordination, trouble walking, seizures Rash, fever, and swollen lymph nodes Redness, blistering, peeling, or loosening  of the skin, including inside the mouth Sudden or severe stomach pain, bloody diarrhea, fever, nausea, vomiting Side effects that usually do not require medical attention (report to your care team if they continue or are bothersome): Bone, joint, or muscle pain Diarrhea Fatigue Loss of appetite Nausea Skin rash This list may not describe all possible side effects. Call your doctor for medical advice about side effects. You may report side effects to FDA at 1-800-FDA-1088. Where should I keep my medication? This medication is given in a hospital or clinic. It will not be stored at home. NOTE: This sheet is a summary. It may not cover all possible information. If you have questions about this medicine, talk to your doctor, pharmacist, or health care provider.  2024 Elsevier/Gold Standard (2022-01-31 00:00:00)       To help prevent nausea and vomiting after your treatment, we encourage you to take your nausea medication as directed.  BELOW ARE SYMPTOMS THAT SHOULD BE REPORTED IMMEDIATELY: *FEVER GREATER THAN 100.4 F (38 C) OR HIGHER *CHILLS OR SWEATING *NAUSEA AND VOMITING THAT IS NOT CONTROLLED WITH YOUR NAUSEA MEDICATION *UNUSUAL SHORTNESS OF BREATH *UNUSUAL BRUISING OR BLEEDING *URINARY PROBLEMS (pain or burning when urinating, or frequent urination) *BOWEL PROBLEMS (unusual diarrhea, constipation, pain near the anus) TENDERNESS IN MOUTH AND THROAT WITH OR WITHOUT PRESENCE OF ULCERS (sore throat, sores in mouth, or a toothache) UNUSUAL RASH, SWELLING OR PAIN  UNUSUAL VAGINAL DISCHARGE OR ITCHING     Items with * indicate a potential emergency and should be followed up as soon as possible or go to the Emergency Department if any problems should occur.  Please show the CHEMOTHERAPY ALERT CARD or IMMUNOTHERAPY ALERT CARD at check-in to the Emergency Department and triage nurse.  Should you have questions after your visit or need to cancel or reschedule your appointment, please contact  [facility] [phone]  and follow the prompts.  Office hours are 8:00 a.m. to 4:30 p.m. Monday - Friday. Please note that voicemails left after 4:00 p.m. may not be returned until the following business day.  We are closed weekends and major holidays. You have access to a nurse at all times for urgent questions. Please call the main number to the clinic [phone] and follow the prompts.  For any non-urgent questions, you may also contact your provider using MyChart. We now offer e-Visits for anyone 18 and older to request care online for non-urgent symptoms. For details visit [email or web address].   Also download the MyChart app! Go to the app store, search "MyChart", open the app, select [facility], and log in with your MyChart username and password.   

## 2023-07-10 ENCOUNTER — Other Ambulatory Visit: Payer: Self-pay

## 2023-07-12 ENCOUNTER — Inpatient Hospital Stay: Payer: Medicare Other

## 2023-07-12 ENCOUNTER — Inpatient Hospital Stay: Payer: Medicare Other | Attending: Hematology | Admitting: Hematology

## 2023-07-12 VITALS — BP 126/70 | HR 86 | Temp 96.1°F | Resp 18

## 2023-07-12 VITALS — BP 137/77 | HR 77 | Temp 97.6°F | Resp 18 | Wt 151.0 lb

## 2023-07-12 DIAGNOSIS — C7931 Secondary malignant neoplasm of brain: Secondary | ICD-10-CM | POA: Diagnosis not present

## 2023-07-12 DIAGNOSIS — Z79620 Long term (current) use of immunosuppressive biologic: Secondary | ICD-10-CM | POA: Diagnosis not present

## 2023-07-12 DIAGNOSIS — J449 Chronic obstructive pulmonary disease, unspecified: Secondary | ICD-10-CM | POA: Insufficient documentation

## 2023-07-12 DIAGNOSIS — Z5112 Encounter for antineoplastic immunotherapy: Secondary | ICD-10-CM | POA: Diagnosis present

## 2023-07-12 DIAGNOSIS — C3491 Malignant neoplasm of unspecified part of right bronchus or lung: Secondary | ICD-10-CM

## 2023-07-12 DIAGNOSIS — C3411 Malignant neoplasm of upper lobe, right bronchus or lung: Secondary | ICD-10-CM | POA: Insufficient documentation

## 2023-07-12 LAB — CBC WITH DIFFERENTIAL/PLATELET
Abs Immature Granulocytes: 0.02 10*3/uL (ref 0.00–0.07)
Basophils Absolute: 0.1 10*3/uL (ref 0.0–0.1)
Basophils Relative: 1 %
Eosinophils Absolute: 0.5 10*3/uL (ref 0.0–0.5)
Eosinophils Relative: 8 %
HCT: 41.9 % (ref 39.0–52.0)
Hemoglobin: 13.6 g/dL (ref 13.0–17.0)
Immature Granulocytes: 0 %
Lymphocytes Relative: 12 %
Lymphs Abs: 0.8 10*3/uL (ref 0.7–4.0)
MCH: 30 pg (ref 26.0–34.0)
MCHC: 32.5 g/dL (ref 30.0–36.0)
MCV: 92.3 fL (ref 80.0–100.0)
Monocytes Absolute: 0.6 10*3/uL (ref 0.1–1.0)
Monocytes Relative: 10 %
Neutro Abs: 4.2 10*3/uL (ref 1.7–7.7)
Neutrophils Relative %: 69 %
Platelets: 243 10*3/uL (ref 150–400)
RBC: 4.54 MIL/uL (ref 4.22–5.81)
RDW: 13.8 % (ref 11.5–15.5)
WBC: 6.1 10*3/uL (ref 4.0–10.5)
nRBC: 0 % (ref 0.0–0.2)

## 2023-07-12 LAB — TSH: TSH: 4.767 u[IU]/mL — ABNORMAL HIGH (ref 0.350–4.500)

## 2023-07-12 LAB — COMPREHENSIVE METABOLIC PANEL
ALT: 15 U/L (ref 0–44)
AST: 19 U/L (ref 15–41)
Albumin: 3.8 g/dL (ref 3.5–5.0)
Alkaline Phosphatase: 74 U/L (ref 38–126)
Anion gap: 5 (ref 5–15)
BUN: 15 mg/dL (ref 8–23)
CO2: 30 mmol/L (ref 22–32)
Calcium: 8.8 mg/dL — ABNORMAL LOW (ref 8.9–10.3)
Chloride: 96 mmol/L — ABNORMAL LOW (ref 98–111)
Creatinine, Ser: 0.77 mg/dL (ref 0.61–1.24)
GFR, Estimated: >60 mL/min (ref >60)
Glucose, Bld: 88 mg/dL (ref 70–99)
Potassium: 4.2 mmol/L (ref 3.5–5.1)
Sodium: 131 mmol/L — ABNORMAL LOW (ref 135–145)
Total Bilirubin: 0.4 mg/dL (ref 0.3–1.2)
Total Protein: 6.6 g/dL (ref 6.5–8.1)

## 2023-07-12 MED ORDER — SODIUM CHLORIDE 0.9 % IV SOLN
400.0000 mg | Freq: Once | INTRAVENOUS | Status: AC
  Administered 2023-07-12: 400 mg via INTRAVENOUS
  Filled 2023-07-12: qty 16

## 2023-07-12 MED ORDER — SODIUM CHLORIDE 0.9 % IV SOLN: Freq: Once | INTRAVENOUS | Status: AC

## 2023-07-12 NOTE — Progress Notes (Signed)
Patient presents today for Keytruda infusion. Patient is in satisfactory condition with no new complaints voiced.  Vital signs are stable.  Labs reviewed by Dr. Katragadda during the office visit and all labs are within treatment parameters.  We will proceed with treatment per MD orders.   Treatment given today per MD orders. Tolerated infusion without adverse affects. Vital signs stable. No complaints at this time. Discharged from clinic ambulatory in stable condition. Alert and oriented x 3. F/U with [facility] as scheduled.   

## 2023-07-12 NOTE — Patient Instructions (Signed)

## 2023-07-12 NOTE — Progress Notes (Signed)
[facility] [address] [place] [address]    Clinic Day:  07/12/23   Referring physician: No ref. provider found  Patient Care Team: Pcp, No as PCP - General [name], RN as Oncology Nurse Navigator (Oncology) [name], MD as Medical Oncologist (Medical Oncology)   ASSESSMENT & PLAN:   Assessment: 1.  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 -Mutations were negative for EGFR, ALK, ROS1, RET, BRAF V600 -PD-L1 22 C3 greater than 95%. -He was started on single agent pembrolizumab every 3 weeks under the direction of Dr. Alain Marion in [place] in August 2020, later switched to every 6 weeks. - CT chest (06/21/2022): Irregular solid 1.4 cm left lower lobe lung nodule, slightly increased suspicious for malignancy.  This has been steadily increasing for the last 1 year.  Stable spiculated solid 3.4 cm right upper lobe lung mass.  Mild left axillary and left retrocrural adenopathy stable. - PET scan (08/03/2022): Left lower lobe lung nodule 1.3 cm solid irregular with SUV 1.8.  Hypermetabolic spiculated 3.3 cm right upper lobe lung nodule, SUV 12.  Hypermetabolic left retrocrural and axillary lymph nodes.  Solitary left level 2 neck lymph node. - Right upper lobe lung mass is stable since March 2022.  Steadily increasing left lower lobe lung nodule has very low FDG uptake on PET scan. - Left axillary lymph node biopsy (08/31/2022): Metastatic poorly differentiated adenocarcinoma. - NGS: PD-L1 TPS 100%, no other targetable mutations.  TMB-low.  MSI-stable.  CDK N2A pathogenic variant. - XRT to the left axillary lymph node from 05/08/2023 through 05/28/2023, 40 Gray   2.  Social/family history: -He is a retired Naval architect.  Quit smoking in August 2020. -Father had prostate cancer.    Plan: 1.  Stage IV  adenocarcinoma of the lung to the brain, PD-L1 TPS >95%: - PET scan on 03/29/2023: Hypermetabolic left axillary nodal metastasis increased in size and number in metabolism.  Other lymphadenopathy is stable. - He underwent XRT to the left axillary lymph node from 05/08/2023 through 05/28/2023. - On examination, left axillary lymph node decreased in size. - He does not report any immunotherapy related side effects. - Labs today: Normal LFTs and CBC.  TSH is 4.7. - Proceed with Keytruda today and in 6 weeks.  RTC 12 weeks for follow-up.  Will plan to repeat scan prior to next visit.    2.  Brain metastasis: - MRI brain on 03/10/2023: No evidence of intracranial metastatic disease.  Will consider once a year brain MRI.   3.  COPD: - Continue Breo Ellipta once daily and Ventolin as needed.  Symptoms well-controlled.    Orders Placed This Encounter  Procedures   NM PET Image Restag (PS) Skull Base To Thigh    Standing Status:   Future    Standing Expiration Date:   07/11/2024    Order Specific Question:   If indicated for the ordered procedure, I authorize the administration of a radiopharmaceutical per Radiology protocol    Answer:   Yes    Order Specific Question:   Preferred imaging location?    Answer:   [facility]   CBC with Differential    Standing Status:   Future    Standing Expiration Date:   10/03/2024   Comprehensive metabolic panel    Standing Status:   Future  Standing Expiration Date:   10/03/2024   T4    Standing Status:   Future    Standing Expiration Date:   10/03/2024   TSH    Standing Status:   Future    Standing Expiration Date:   10/03/2024      [name],acting as a scribe for [name], MD.,have documented all relevant documentation on the behalf of [name], MD,as directed by  [name], MD while in the presence of [name], MD.  I, [name] MD, have reviewed the above documentation for accuracy and  completeness, and I agree with the above.     [name], MD   10/10/20245:28 PM  CHIEF COMPLAINT:   Diagnosis: metastatic right lung adenocarcinoma to brain    Cancer Staging  Adenocarcinoma of lung, stage 4, right [facility]) Staging form: Lung, AJCC 8th Edition - Clinical stage from 12/08/2020: Stage IVB (cT2b, cN3, pM1c) - Signed by [name], MD on 12/08/2020    Prior Therapy: none  Current Therapy:  Keytruda    HISTORY OF PRESENT ILLNESS:   Oncology History  Adenocarcinoma of lung, stage 4, right (HCC)  12/08/2020 Initial Diagnosis   Adenocarcinoma of lung, stage 4, right (HCC)   12/08/2020 Cancer Staging   Staging form: Lung, AJCC 8th Edition - Clinical stage from 12/08/2020: Stage IVB (cT2b, cN3, pM1c) - Signed by [name], MD on 12/08/2020 Histopathologic type: Adenocarcinoma, NOS   12/29/2020 - 05/17/2022 Chemotherapy   Patient is on Treatment Plan : LUNG Pembrolizumab q42d     12/29/2020 -  Chemotherapy   Patient is on Treatment Plan : LUNG Pembrolizumab (400) q42d        INTERVAL HISTORY:   Antonio Payne is a 67 y.o. male presenting to clinic today for follow up of metastatic right lung adenocarcinoma to brain. He was last seen by me on 04/19/23.  Today, he states that he is doing well overall. His appetite level is at 100%. His energy level is at 80%. He denies any issues with XRT and states that it went well. He denies any side effects from treatment or new pains.   PAST MEDICAL HISTORY:   Past Medical History: Past Medical History:  Diagnosis Date   Cancer (HCC)    lung cancer     Surgical History: Past Surgical History:  Procedure Laterality Date   BACK SURGERY      Social History: Social History   Socioeconomic History   Marital status: Single    Spouse name: Not on file   Number of children: Not on file   Years of education: Not on file   Highest education level: Not on file  Occupational History   Not on file  Tobacco  Use   Smoking status: Former    Current packs/day: 0.00    Types: Cigarettes    Quit date: 12/01/2019    Years since quitting: 3.6   Smokeless tobacco: Never  Substance and Sexual Activity   Alcohol use: Never   Drug use: Never   Sexual activity: Not Currently  Other Topics Concern   Not on file  Social History Narrative   Not on file   Social Determinants of Health   Financial Resource Strain: Low Risk  (12/06/2020)   Overall Financial Resource Strain (CARDIA)    Difficulty of Paying Living Expenses: Not hard at all  Food Insecurity: No Food Insecurity (12/06/2020)   Hunger Vital Sign    Worried About Running Out of Food in the Last  Year: Never true    Ran Out of Food in the Last Year: Never true  Transportation Needs: No Transportation Needs (12/06/2020)   PRAPARE - Administrator, Civil Service (Medical): No    Lack of Transportation (Non-Medical): No  Physical Activity: Sufficiently Active (12/06/2020)   Exercise Vital Sign    Days of Exercise per Week: 7 days    Minutes of Exercise per Session: 30 min  Stress: No Stress Concern Present (12/06/2020)   [name] of Occupational Health - Occupational Stress Questionnaire    Feeling of Stress : Only a little  Social Connections: Moderately Integrated (12/06/2020)   Social Connection and Isolation Panel [NHANES]    Frequency of Communication with Friends and Family: More than three times a week    Frequency of Social Gatherings with Friends and Family: More than three times a week    Attends Religious Services: 1 to 4 times per year    Active Member of [employer] or Organizations: No    Attends Banker Meetings: 1 to 4 times per year    Marital Status: Divorced  Catering manager Violence: Not At Risk (12/06/2020)   Humiliation, Afraid, Rape, and Kick questionnaire    Fear of Current or Ex-Partner: No    Emotionally Abused: No    Physically Abused: No    Sexually Abused: No    Family History: No family  history on file.  Current Medications:  Current Outpatient Medications:    albuterol (VENTOLIN HFA) 108 (90 Base) MCG/ACT inhaler, Inhale 2 puffs into the lungs every 4 (four) hours as needed for wheezing or shortness of breath., Disp: 18 g, Rfl: 1   fluticasone furoate-vilanterol (BREO ELLIPTA) 200-25 MCG/ACT AEPB, INHALE 1 PUFF INTO THE LUNGS EVERY MORNING, Disp: 60 each, Rfl: 11 No current facility-administered medications for this visit.  Facility-Administered Medications Ordered in Other Visits:    octreotide (SANDOSTATIN LAR) 30 MG IM injection, , , ,    Allergies: No Known Allergies  REVIEW OF SYSTEMS:   Review of Systems  Constitutional:  Negative for chills, fatigue and fever.  HENT:   Negative for lump/mass, mouth sores, nosebleeds, sore throat and trouble swallowing.   Eyes:  Negative for eye problems.  Respiratory:  Negative for cough and shortness of breath.   Cardiovascular:  Negative for chest pain, leg swelling and palpitations.  Gastrointestinal:  Negative for abdominal pain, constipation, diarrhea, nausea and vomiting.  Genitourinary:  Negative for bladder incontinence, difficulty urinating, dysuria, frequency, hematuria and nocturia.   Musculoskeletal:  Negative for arthralgias, back pain, flank pain, myalgias and neck pain.  Skin:  Negative for itching and rash.  Neurological:  Negative for dizziness, headaches and numbness.  Hematological:  Does not bruise/bleed easily.  Psychiatric/Behavioral:  Negative for depression, sleep disturbance and suicidal ideas. The patient is not nervous/anxious.   All other systems reviewed and are negative.    VITALS:   Blood pressure 137/77, pulse 77, temperature 97.6 F (36.4 C), temperature source Oral, resp. rate 18, weight 151 lb (68.5 kg), SpO2 100%.  Wt Readings from Last 3 Encounters:  07/12/23 151 lb (68.5 kg)  05/31/23 146 lb 9.6 oz (66.5 kg)  04/19/23 150 lb 3.2 oz (68.1 kg)    Body mass index is 22.63  kg/m.  Performance status (ECOG): 1 - Symptomatic but completely ambulatory  PHYSICAL EXAM:   Physical Exam Vitals and nursing note reviewed. Exam conducted with a chaperone present.  Constitutional:      Appearance:  Normal appearance.  Cardiovascular:     Rate and Rhythm: Normal rate and regular rhythm.     Pulses: Normal pulses.     Heart sounds: Normal heart sounds.  Pulmonary:     Effort: Pulmonary effort is normal.     Breath sounds: Normal breath sounds.  Abdominal:     Palpations: Abdomen is soft. There is no hepatomegaly, splenomegaly or mass.     Tenderness: There is no abdominal tenderness.  Musculoskeletal:     Right lower leg: No edema.     Left lower leg: No edema.  Lymphadenopathy:     Cervical: No cervical adenopathy.     Right cervical: No superficial, deep or posterior cervical adenopathy.    Left cervical: No superficial, deep or posterior cervical adenopathy.     Upper Body:     Right upper body: No supraclavicular or axillary adenopathy.     Left upper body: Axillary adenopathy (1 cm) present. No supraclavicular adenopathy.  Neurological:     General: No focal deficit present.     Mental Status: He is alert and oriented to person, place, and time.  Psychiatric:        Mood and Affect: Mood normal.        Behavior: Behavior normal.     LABS:      Latest Ref Rng & Units 07/12/2023   12:01 PM 05/31/2023   12:48 PM 04/19/2023   12:23 PM  CBC  WBC 4.0 - 10.5 K/uL 6.1  6.2  7.9   Hemoglobin 13.0 - 17.0 g/dL 91.4  78.2  95.6   Hematocrit 39.0 - 52.0 % 41.9  38.1  38.6   Platelets 150 - 400 K/uL 243  277  313       Latest Ref Rng & Units 07/12/2023   12:01 PM 05/31/2023   12:48 PM 04/19/2023   12:23 PM  CMP  Glucose 70 - 99 mg/dL 88  83  213   BUN 8 - 23 mg/dL 15  12  12    Creatinine 0.61 - 1.24 mg/dL 0.86  5.78  4.69   Sodium 135 - 145 mmol/L 131  129  128   Potassium 3.5 - 5.1 mmol/L 4.2  4.1  4.0   Chloride 98 - 111 mmol/L 96  95  95   CO2  22 - 32 mmol/L 30  28  27    Calcium 8.9 - 10.3 mg/dL 8.8  8.6  8.5   Total Protein 6.5 - 8.1 g/dL 6.6  6.7  7.4   Total Bilirubin 0.3 - 1.2 mg/dL 0.4  0.6  0.6   Alkaline Phos 38 - 126 U/L 74  74  83   AST 15 - 41 U/L 19  17  17    ALT 0 - 44 U/L 15  14  14       No results found for: "CEA1", "CEA" / No results found for: "CEA1", "CEA" No results found for: "PSA1" No results found for: "GEX528" No results found for: "CAN125"  No results found for: "TOTALPROTELP", "ALBUMINELP", "A1GS", "A2GS", "BETS", "BETA2SER", "GAMS", "MSPIKE", "SPEI" No results found for: "TIBC", "FERRITIN", "IRONPCTSAT" No results found for: "LDH"   STUDIES:   No results found.

## 2023-07-12 NOTE — Patient Instructions (Signed)
[facility]  Discharge Instructions: Thank you for choosing [facility] to provide your oncology and hematology care.  If you have a lab appointment with the Cancer Center - please note that after April 8th, 2024, all labs will be drawn in the cancer center.  You do not have to check in or register with the main entrance as you have in the past but will complete your check-in in the cancer center.  Wear comfortable clothing and clothing appropriate for easy access to any Portacath or PICC line.   We strive to give you quality time with your provider. You may need to reschedule your appointment if you arrive late (15 or more minutes).  Arriving late affects you and other patients whose appointments are after yours.  Also, if you miss three or more appointments without notifying the office, you may be dismissed from the clinic at the provider's discretion.      For prescription refill requests, have your pharmacy contact our office and allow 72 hours for refills to be completed.    Today you received the following chemotherapy and/or immunotherapy agents Keytruda   To help prevent nausea and vomiting after your treatment, we encourage you to take your nausea medication as directed.  Pembrolizumab Injection What is this medication? PEMBROLIZUMAB (PEM broe LIZ ue mab) treats some types of cancer. It works by helping your immune system slow or stop the spread of cancer cells. It is a monoclonal antibody. This medicine may be used for other purposes; ask your health care provider or pharmacist if you have questions. COMMON BRAND NAME(S): Keytruda What should I tell my care team before I take this medication? They need to know if you have any of these conditions: Allogeneic stem cell transplant (uses someone else's stem cells) Autoimmune diseases, such as Crohn disease, ulcerative colitis, lupus History of chest radiation Nervous system problems, such as  Guillain-Barre syndrome, myasthenia gravis Organ transplant An unusual or allergic reaction to pembrolizumab, other medications, foods, dyes, or preservatives Pregnant or trying to get pregnant Breast-feeding How should I use this medication? This medication is injected into a vein. It is given by your care team in a hospital or clinic setting. A special MedGuide will be given to you before each treatment. Be sure to read this information carefully each time. Talk to your care team about the use of this medication in children. While it may be prescribed for children as young as 6 months for selected conditions, precautions do apply. Overdosage: If you think you have taken too much of this medicine contact a poison control center or emergency room at once. NOTE: This medicine is only for you. Do not share this medicine with others. What if I miss a dose? Keep appointments for follow-up doses. It is important not to miss your dose. Call your care team if you are unable to keep an appointment. What may interact with this medication? Interactions have not been studied. This list may not describe all possible interactions. Give your health care provider a list of all the medicines, herbs, non-prescription drugs, or dietary supplements you use. Also tell them if you smoke, drink alcohol, or use illegal drugs. Some items may interact with your medicine. What should I watch for while using this medication? Your condition will be monitored carefully while you are receiving this medication. You may need blood work while taking this medication. This medication may cause serious skin reactions. They can happen weeks to months   after starting the medication. Contact your care team right away if you notice fevers or flu-like symptoms with a rash. The rash may be red or purple and then turn into blisters or peeling of the skin. You may also notice a red rash with swelling of the face, lips, or lymph nodes in your  neck or under your arms. Tell your care team right away if you have any change in your eyesight. Talk to your care team if you may be pregnant. Serious birth defects can occur if you take this medication during pregnancy and for 4 months after the last dose. You will need a negative pregnancy test before starting this medication. Contraception is recommended while taking this medication and for 4 months after the last dose. Your care team can help you find the option that works for you. Do not breastfeed while taking this medication and for 4 months after the last dose. What side effects may I notice from receiving this medication? Side effects that you should report to your care team as soon as possible: Allergic reactions--skin rash, itching, hives, swelling of the face, lips, tongue, or throat Dry cough, shortness of breath or trouble breathing Eye pain, redness, irritation, or discharge with blurry or decreased vision Heart muscle inflammation--unusual weakness or fatigue, shortness of breath, chest pain, fast or irregular heartbeat, dizziness, swelling of the ankles, feet, or hands Hormone gland problems--headache, sensitivity to light, unusual weakness or fatigue, dizziness, fast or irregular heartbeat, increased sensitivity to cold or heat, excessive sweating, constipation, hair loss, increased thirst or amount of urine, tremors or shaking, irritability Infusion reactions--chest pain, shortness of breath or trouble breathing, feeling faint or lightheaded Kidney injury (glomerulonephritis)--decrease in the amount of urine, red or dark brown urine, foamy or bubbly urine, swelling of the ankles, hands, or feet Liver injury--right upper belly pain, loss of appetite, nausea, light-colored stool, dark yellow or brown urine, yellowing skin or eyes, unusual weakness or fatigue Pain, tingling, or numbness in the hands or feet, muscle weakness, change in vision, confusion or trouble speaking, loss of  balance or coordination, trouble walking, seizures Rash, fever, and swollen lymph nodes Redness, blistering, peeling, or loosening of the skin, including inside the mouth Sudden or severe stomach pain, bloody diarrhea, fever, nausea, vomiting Side effects that usually do not require medical attention (report to your care team if they continue or are bothersome): Bone, joint, or muscle pain Diarrhea Fatigue Loss of appetite Nausea Skin rash This list may not describe all possible side effects. Call your doctor for medical advice about side effects. You may report side effects to FDA at 1-800-FDA-1088. Where should I keep my medication? This medication is given in a hospital or clinic. It will not be stored at home. NOTE: This sheet is a summary. It may not cover all possible information. If you have questions about this medicine, talk to your doctor, pharmacist, or health care provider.  2024 Elsevier/Gold Standard (2022-01-31 00:00:00)   BELOW ARE SYMPTOMS THAT SHOULD BE REPORTED IMMEDIATELY: *FEVER GREATER THAN 100.4 F (38 C) OR HIGHER *CHILLS OR SWEATING *NAUSEA AND VOMITING THAT IS NOT CONTROLLED WITH YOUR NAUSEA MEDICATION *UNUSUAL SHORTNESS OF BREATH *UNUSUAL BRUISING OR BLEEDING *URINARY PROBLEMS (pain or burning when urinating, or frequent urination) *BOWEL PROBLEMS (unusual diarrhea, constipation, pain near the anus) TENDERNESS IN MOUTH AND THROAT WITH OR WITHOUT PRESENCE OF ULCERS (sore throat, sores in mouth, or a toothache) UNUSUAL RASH, SWELLING OR PAIN  UNUSUAL VAGINAL DISCHARGE OR ITCHING   Items   with * indicate a potential emergency and should be followed up as soon as possible or go to the Emergency Department if any problems should occur.  Please show the CHEMOTHERAPY ALERT CARD or IMMUNOTHERAPY ALERT CARD at check-in to the Emergency Department and triage nurse.  Should you have questions after your visit or need to cancel or reschedule your appointment, please  contact [facility] [phone]  and follow the prompts.  Office hours are 8:00 a.m. to 4:30 p.m. Monday - Friday. Please note that voicemails left after 4:00 p.m. may not be returned until the following business day.  We are closed weekends and major holidays. You have access to a nurse at all times for urgent questions. Please call the main number to the clinic [phone] and follow the prompts.  For any non-urgent questions, you may also contact your provider using MyChart. We now offer e-Visits for anyone 18 and older to request care online for non-urgent symptoms. For details visit [email or web address].   Also download the MyChart app! Go to the app store, search "MyChart", open the app, select [place], and log in with your MyChart username and password.   

## 2023-07-12 NOTE — Progress Notes (Signed)
Patient has been examined by Dr. Katragadda. Vital signs and labs have been reviewed by MD - ANC, Creatinine, LFTs, hemoglobin, and platelets are within treatment parameters per M.D. - pt may proceed with treatment.  Primary RN and pharmacy notified.  

## 2023-07-13 LAB — T4: T4, Total: 7.5 ug/dL (ref 4.5–12.0)

## 2023-07-17 ENCOUNTER — Other Ambulatory Visit: Payer: Self-pay

## 2023-08-16 ENCOUNTER — Other Ambulatory Visit (HOSPITAL_COMMUNITY): Payer: Medicare Other

## 2023-08-22 ENCOUNTER — Other Ambulatory Visit: Payer: Medicare Other

## 2023-08-23 ENCOUNTER — Other Ambulatory Visit: Payer: Medicare Other

## 2023-08-23 ENCOUNTER — Ambulatory Visit: Payer: Medicare Other | Admitting: Hematology

## 2023-08-23 ENCOUNTER — Inpatient Hospital Stay: Payer: Medicare Other

## 2023-08-23 ENCOUNTER — Inpatient Hospital Stay: Payer: Medicare Other | Attending: Hematology

## 2023-08-23 VITALS — BP 135/80 | HR 88 | Temp 96.5°F | Resp 18

## 2023-08-23 DIAGNOSIS — C3491 Malignant neoplasm of unspecified part of right bronchus or lung: Secondary | ICD-10-CM

## 2023-08-23 DIAGNOSIS — C3411 Malignant neoplasm of upper lobe, right bronchus or lung: Secondary | ICD-10-CM | POA: Diagnosis present

## 2023-08-23 DIAGNOSIS — Z5112 Encounter for antineoplastic immunotherapy: Secondary | ICD-10-CM | POA: Insufficient documentation

## 2023-08-23 DIAGNOSIS — Z79620 Long term (current) use of immunosuppressive biologic: Secondary | ICD-10-CM | POA: Diagnosis not present

## 2023-08-23 LAB — COMPREHENSIVE METABOLIC PANEL WITH GFR
ALT: 25 U/L (ref 0–44)
AST: 42 U/L — ABNORMAL HIGH (ref 15–41)
Albumin: 4.5 g/dL (ref 3.5–5.0)
Alkaline Phosphatase: 77 U/L (ref 38–126)
Anion gap: 10 (ref 5–15)
BUN: 13 mg/dL (ref 8–23)
CO2: 29 mmol/L (ref 22–32)
Calcium: 8.8 mg/dL — ABNORMAL LOW (ref 8.9–10.3)
Chloride: 90 mmol/L — ABNORMAL LOW (ref 98–111)
Creatinine, Ser: 1.08 mg/dL (ref 0.61–1.24)
GFR, Estimated: >60 mL/min
Glucose, Bld: 101 mg/dL — ABNORMAL HIGH (ref 70–99)
Potassium: 4.1 mmol/L (ref 3.5–5.1)
Sodium: 129 mmol/L — ABNORMAL LOW (ref 135–145)
Total Bilirubin: 0.7 mg/dL
Total Protein: 7.6 g/dL (ref 6.5–8.1)

## 2023-08-23 LAB — CBC WITH DIFFERENTIAL/PLATELET
Abs Immature Granulocytes: 0.02 10*3/uL (ref 0.00–0.07)
Basophils Absolute: 0.1 10*3/uL (ref 0.0–0.1)
Basophils Relative: 1 %
Eosinophils Absolute: 0.2 10*3/uL (ref 0.0–0.5)
Eosinophils Relative: 4 %
HCT: 45.1 % (ref 39.0–52.0)
Hemoglobin: 14.9 g/dL (ref 13.0–17.0)
Immature Granulocytes: 0 %
Lymphocytes Relative: 20 %
Lymphs Abs: 1.2 10*3/uL (ref 0.7–4.0)
MCH: 29.9 pg (ref 26.0–34.0)
MCHC: 33 g/dL (ref 30.0–36.0)
MCV: 90.6 fL (ref 80.0–100.0)
Monocytes Absolute: 0.5 10*3/uL (ref 0.1–1.0)
Monocytes Relative: 9 %
Neutro Abs: 4.1 10*3/uL (ref 1.7–7.7)
Neutrophils Relative %: 66 %
Platelets: 258 10*3/uL (ref 150–400)
RBC: 4.98 MIL/uL (ref 4.22–5.81)
RDW: 13.8 % (ref 11.5–15.5)
WBC: 6.1 10*3/uL (ref 4.0–10.5)
nRBC: 0 % (ref 0.0–0.2)

## 2023-08-23 LAB — TSH: TSH: 82.604 u[IU]/mL — ABNORMAL HIGH (ref 0.350–4.500)

## 2023-08-23 MED ORDER — SODIUM CHLORIDE 0.9 % IV SOLN: Freq: Once | INTRAVENOUS | Status: AC

## 2023-08-23 MED ORDER — SODIUM CHLORIDE 0.9 % IV SOLN
400.0000 mg | Freq: Once | INTRAVENOUS | Status: AC
  Administered 2023-08-23: 400 mg via INTRAVENOUS
  Filled 2023-08-23: qty 16

## 2023-08-23 NOTE — Progress Notes (Signed)
Patient presents today for Keytruda infusion. Patient is in satisfactory condition with no new complaints voiced.  Vital signs are stable.  Labs reviewed and all labs are within treatment parameters. Patient's TSH noted to be 82.604, Dr.Kandala made aware. We will proceed with treatment per MD orders.    Peripheral IV started with good blood return pre and post infusion.  Treatment given today per MD orders. Tolerated infusion without adverse affects. Vital signs stable. No complaints at this time. Discharged from clinic ambulatory in stable condition. Alert and oriented x 3. F/U with [facility] as scheduled.

## 2023-08-23 NOTE — Patient Instructions (Signed)
 [facility] A DEPT OF [facility]  Discharge Instructions: Thank you for choosing [facility] to provide your oncology and hematology care.  If you have a lab appointment with the Cancer Center - please note that after April 8th, 2024, all labs will be drawn in the cancer center.  You do not have to check in or register with the main entrance as you have in the past but will complete your check-in in the cancer center.  Wear comfortable clothing and clothing appropriate for easy access to any Portacath or PICC line.   We strive to give you quality time with your provider. You may need to reschedule your appointment if you arrive late (15 or more minutes).  Arriving late affects you and other patients whose appointments are after yours.  Also, if you miss three or more appointments without notifying the office, you may be dismissed from the clinic at the provider's discretion.      For prescription refill requests, have your pharmacy contact our office and allow 72 hours for refills to be completed.    Today you received the following chemotherapy and/or immunotherapy agents Keytruda   To help prevent nausea and vomiting after your treatment, we encourage you to take your nausea medication as directed.  Pembrolizumab Injection What is this medication? PEMBROLIZUMAB (PEM broe LIZ ue mab) treats some types of cancer. It works by helping your immune system slow or stop the spread of cancer cells. It is a monoclonal antibody. This medicine may be used for other purposes; ask your health care provider or pharmacist if you have questions. COMMON BRAND NAME(S): Keytruda What should I tell my care team before I take this medication? They need to know if you have any of these conditions: Allogeneic stem cell transplant (uses someone else's stem cells) Autoimmune diseases, such as Crohn disease, ulcerative colitis, lupus History of chest  radiation Nervous system problems, such as Guillain-Barre syndrome, myasthenia gravis Organ transplant An unusual or allergic reaction to pembrolizumab, other medications, foods, dyes, or preservatives Pregnant or trying to get pregnant Breast-feeding How should I use this medication? This medication is injected into a vein. It is given by your care team in a hospital or clinic setting. A special MedGuide will be given to you before each treatment. Be sure to read this information carefully each time. Talk to your care team about the use of this medication in children. While it may be prescribed for children as young as 6 months for selected conditions, precautions do apply. Overdosage: If you think you have taken too much of this medicine contact a poison control center or emergency room at once. NOTE: This medicine is only for you. Do not share this medicine with others. What if I miss a dose? Keep appointments for follow-up doses. It is important not to miss your dose. Call your care team if you are unable to keep an appointment. What may interact with this medication? Interactions have not been studied. This list may not describe all possible interactions. Give your health care provider a list of all the medicines, herbs, non-prescription drugs, or dietary supplements you use. Also tell them if you smoke, drink alcohol, or use illegal drugs. Some items may interact with your medicine. What should I watch for while using this medication? Your condition will be monitored carefully while you are receiving this medication. You may need blood work while taking this medication. This medication may cause serious  skin reactions. They can happen weeks to months after starting the medication. Contact your care team right away if you notice fevers or flu-like symptoms with a rash. The rash may be red or purple and then turn into blisters or peeling of the skin. You may also notice a red rash with  swelling of the face, lips, or lymph nodes in your neck or under your arms. Tell your care team right away if you have any change in your eyesight. Talk to your care team if you may be pregnant. Serious birth defects can occur if you take this medication during pregnancy and for 4 months after the last dose. You will need a negative pregnancy test before starting this medication. Contraception is recommended while taking this medication and for 4 months after the last dose. Your care team can help you find the option that works for you. Do not breastfeed while taking this medication and for 4 months after the last dose. What side effects may I notice from receiving this medication? Side effects that you should report to your care team as soon as possible: Allergic reactions--skin rash, itching, hives, swelling of the face, lips, tongue, or throat Dry cough, shortness of breath or trouble breathing Eye pain, redness, irritation, or discharge with blurry or decreased vision Heart muscle inflammation--unusual weakness or fatigue, shortness of breath, chest pain, fast or irregular heartbeat, dizziness, swelling of the ankles, feet, or hands Hormone gland problems--headache, sensitivity to light, unusual weakness or fatigue, dizziness, fast or irregular heartbeat, increased sensitivity to cold or heat, excessive sweating, constipation, hair loss, increased thirst or amount of urine, tremors or shaking, irritability Infusion reactions--chest pain, shortness of breath or trouble breathing, feeling faint or lightheaded Kidney injury (glomerulonephritis)--decrease in the amount of urine, red or dark brown urine, foamy or bubbly urine, swelling of the ankles, hands, or feet Liver injury--right upper belly pain, loss of appetite, nausea, light-colored stool, dark yellow or brown urine, yellowing skin or eyes, unusual weakness or fatigue Pain, tingling, or numbness in the hands or feet, muscle weakness, change in  vision, confusion or trouble speaking, loss of balance or coordination, trouble walking, seizures Rash, fever, and swollen lymph nodes Redness, blistering, peeling, or loosening of the skin, including inside the mouth Sudden or severe stomach pain, bloody diarrhea, fever, nausea, vomiting Side effects that usually do not require medical attention (report to your care team if they continue or are bothersome): Bone, joint, or muscle pain Diarrhea Fatigue Loss of appetite Nausea Skin rash This list may not describe all possible side effects. Call your doctor for medical advice about side effects. You may report side effects to FDA at 1-800-FDA-1088. Where should I keep my medication? This medication is given in a hospital or clinic. It will not be stored at home. NOTE: This sheet is a summary. It may not cover all possible information. If you have questions about this medicine, talk to your doctor, pharmacist, or health care provider.  2024 Elsevier/Gold Standard (2022-01-31 00:00:00)   BELOW ARE SYMPTOMS THAT SHOULD BE REPORTED IMMEDIATELY: *FEVER GREATER THAN 100.4 F (38 C) OR HIGHER *CHILLS OR SWEATING *NAUSEA AND VOMITING THAT IS NOT CONTROLLED WITH YOUR NAUSEA MEDICATION *UNUSUAL SHORTNESS OF BREATH *UNUSUAL BRUISING OR BLEEDING *URINARY PROBLEMS (pain or burning when urinating, or frequent urination) *BOWEL PROBLEMS (unusual diarrhea, constipation, pain near the anus) TENDERNESS IN MOUTH AND THROAT WITH OR WITHOUT PRESENCE OF ULCERS (sore throat, sores in mouth, or a toothache) UNUSUAL RASH, SWELLING OR PAIN  UNUSUAL VAGINAL DISCHARGE OR ITCHING   Items with * indicate a potential emergency and should be followed up as soon as possible or go to the Emergency Department if any problems should occur.  Please show the CHEMOTHERAPY ALERT CARD or IMMUNOTHERAPY ALERT CARD at check-in to the Emergency Department and triage nurse.  Should you have questions after your visit or need to  cancel or reschedule your appointment, please contact [facility] - [facility] DE[facility] OF [facility] [phone]  and follow the prompts.  Office hours are 8:00 a.m. to 4:30 p.m. Monday - Friday. Please note that voicemails left after 4:00 p.m. may not be returned until the following business day.  We are closed weekends and major holidays. You have access to a nurse at all times for urgent questions. Please call the main number to the clinic [phone] and follow the prompts.  For any non-urgent questions, you may also contact your provider using MyChart. We now offer e-Visits for anyone 25 and older to request care online for non-urgent symptoms. For details visit [email or web address].   Also download the MyChart app! Go to the app store, search "MyChart", open the app, select [place], and log in with your MyChart username and password.

## 2023-08-24 LAB — T4: T4, Total: 1.2 ug/dL — ABNORMAL LOW (ref 4.5–12.0)

## 2023-08-27 ENCOUNTER — Other Ambulatory Visit: Payer: Self-pay | Admitting: Oncology

## 2023-08-27 MED ORDER — LEVOTHYROXINE SODIUM 50 MCG PO TABS: 50.0000 ug | ORAL_TABLET | Freq: Every day | ORAL | 2 refills | Status: DC

## 2023-08-28 ENCOUNTER — Encounter: Payer: Self-pay | Admitting: *Deleted

## 2023-09-04 ENCOUNTER — Encounter: Payer: Self-pay | Admitting: *Deleted

## 2023-09-04 NOTE — Progress Notes (Signed)
Attempted to contact patient regarding thyroid labs, starting of levothyroxine 50 mcg daily and referral to endocrinology via phone and [facility].  Unable to reach patient by either means, however verified with pharmacy that medication was picked up by patient on 08/31/23.

## 2023-09-25 ENCOUNTER — Other Ambulatory Visit: Payer: Self-pay

## 2023-09-25 DIAGNOSIS — C3491 Malignant neoplasm of unspecified part of right bronchus or lung: Secondary | ICD-10-CM

## 2023-09-27 ENCOUNTER — Inpatient Hospital Stay: Payer: Medicare Other | Attending: Hematology

## 2023-09-27 ENCOUNTER — Other Ambulatory Visit: Payer: Self-pay | Admitting: Hematology

## 2023-09-27 ENCOUNTER — Encounter (HOSPITAL_COMMUNITY)
Admission: RE | Admit: 2023-09-27 | Discharge: 2023-09-27 | Disposition: A | Discharge status: Home / Self Care | Payer: Medicare Other | Source: Ambulatory Visit | Attending: Hematology | Admitting: Hematology

## 2023-09-27 DIAGNOSIS — C3411 Malignant neoplasm of upper lobe, right bronchus or lung: Secondary | ICD-10-CM | POA: Insufficient documentation

## 2023-09-27 DIAGNOSIS — C3491 Malignant neoplasm of unspecified part of right bronchus or lung: Secondary | ICD-10-CM | POA: Insufficient documentation

## 2023-09-27 DIAGNOSIS — Z79899 Other long term (current) drug therapy: Secondary | ICD-10-CM | POA: Diagnosis not present

## 2023-09-27 LAB — COMPREHENSIVE METABOLIC PANEL
ALT: 14 U/L (ref 0–44)
AST: 27 U/L (ref 15–41)
Albumin: 4.5 g/dL (ref 3.5–5.0)
Alkaline Phosphatase: 71 U/L (ref 38–126)
Anion gap: 10 (ref 5–15)
BUN: 12 mg/dL (ref 8–23)
CO2: 26 mmol/L (ref 22–32)
Calcium: 9 mg/dL (ref 8.9–10.3)
Chloride: 93 mmol/L — ABNORMAL LOW (ref 98–111)
Creatinine, Ser: 0.93 mg/dL (ref 0.61–1.24)
GFR, Estimated: >60 mL/min (ref >60)
Glucose, Bld: 115 mg/dL — ABNORMAL HIGH (ref 70–99)
Potassium: 4.4 mmol/L (ref 3.5–5.1)
Sodium: 129 mmol/L — ABNORMAL LOW (ref 135–145)
Total Bilirubin: 0.8 mg/dL (ref <1.2)
Total Protein: 7.6 g/dL (ref 6.5–8.1)

## 2023-09-27 LAB — CBC WITH DIFFERENTIAL/PLATELET
Abs Immature Granulocytes: 0.04 10*3/uL (ref 0.00–0.07)
Basophils Absolute: 0.1 10*3/uL (ref 0.0–0.1)
Basophils Relative: 1 %
Eosinophils Absolute: 0.1 10*3/uL (ref 0.0–0.5)
Eosinophils Relative: 2 %
HCT: 41 % (ref 39.0–52.0)
Hemoglobin: 13.7 g/dL (ref 13.0–17.0)
Immature Granulocytes: 1 %
Lymphocytes Relative: 16 %
Lymphs Abs: 1 10*3/uL (ref 0.7–4.0)
MCH: 30.9 pg (ref 26.0–34.0)
MCHC: 33.4 g/dL (ref 30.0–36.0)
MCV: 92.3 fL (ref 80.0–100.0)
Monocytes Absolute: 0.7 10*3/uL (ref 0.1–1.0)
Monocytes Relative: 11 %
Neutro Abs: 4.2 10*3/uL (ref 1.7–7.7)
Neutrophils Relative %: 69 %
Platelets: 378 10*3/uL (ref 150–400)
RBC: 4.44 MIL/uL (ref 4.22–5.81)
RDW: 14.3 % (ref 11.5–15.5)
WBC: 6.1 10*3/uL (ref 4.0–10.5)
nRBC: 0 % (ref 0.0–0.2)

## 2023-09-27 LAB — TSH: TSH: 42.357 u[IU]/mL — ABNORMAL HIGH (ref 0.350–4.500)

## 2023-09-27 MED ORDER — FLUDEOXYGLUCOSE F - 18 (FDG) INJECTION
6.8800 | Freq: Once | INTRAVENOUS | Status: AC | PRN
  Administered 2023-09-27: 6.88 via INTRAVENOUS

## 2023-09-28 LAB — T4: T4, Total: 5.8 ug/dL (ref 4.5–12.0)

## 2023-10-04 ENCOUNTER — Inpatient Hospital Stay (HOSPITAL_BASED_OUTPATIENT_CLINIC_OR_DEPARTMENT_OTHER): Payer: Medicare Other | Admitting: Hematology

## 2023-10-04 ENCOUNTER — Inpatient Hospital Stay: Payer: Medicare Other | Attending: Hematology

## 2023-10-04 VITALS — BP 139/71 | HR 82

## 2023-10-04 VITALS — HR 79 | Resp 18 | Wt 155.9 lb

## 2023-10-04 DIAGNOSIS — C7931 Secondary malignant neoplasm of brain: Secondary | ICD-10-CM | POA: Diagnosis not present

## 2023-10-04 DIAGNOSIS — E039 Hypothyroidism, unspecified: Secondary | ICD-10-CM | POA: Insufficient documentation

## 2023-10-04 DIAGNOSIS — C3491 Malignant neoplasm of unspecified part of right bronchus or lung: Secondary | ICD-10-CM

## 2023-10-04 DIAGNOSIS — J449 Chronic obstructive pulmonary disease, unspecified: Secondary | ICD-10-CM | POA: Insufficient documentation

## 2023-10-04 DIAGNOSIS — C3411 Malignant neoplasm of upper lobe, right bronchus or lung: Secondary | ICD-10-CM | POA: Insufficient documentation

## 2023-10-04 DIAGNOSIS — Z79890 Hormone replacement therapy (postmenopausal): Secondary | ICD-10-CM | POA: Diagnosis not present

## 2023-10-04 DIAGNOSIS — Z5112 Encounter for antineoplastic immunotherapy: Secondary | ICD-10-CM | POA: Insufficient documentation

## 2023-10-04 DIAGNOSIS — Z79620 Long term (current) use of immunosuppressive biologic: Secondary | ICD-10-CM | POA: Insufficient documentation

## 2023-10-04 MED ORDER — SODIUM CHLORIDE 0.9 % IV SOLN: Freq: Once | INTRAVENOUS | Status: AC

## 2023-10-04 MED ORDER — AMOXICILLIN-POT CLAVULANATE 875-125 MG PO TABS: 1.0000 | ORAL_TABLET | Freq: Two times a day (BID) | ORAL | 0 refills | Status: DC

## 2023-10-04 MED ORDER — PEMBROLIZUMAB CHEMO INJECTION 100 MG/4ML
400.0000 mg | Freq: Once | INTRAVENOUS | Status: AC
  Administered 2023-10-04: 400 mg via INTRAVENOUS
  Filled 2023-10-04: qty 16

## 2023-10-04 MED ORDER — AZITHROMYCIN 250 MG PO TABS: ORAL_TABLET | ORAL | 0 refills | Status: DC

## 2023-10-04 NOTE — Progress Notes (Signed)
 Patient has been examined by Dr. Ellin Saba. Vital signs and labs have been reviewed by MD - ANC, Creatinine, LFTs, hemoglobin, and platelets are within treatment parameters per M.D. - pt may proceed with treatment.  Primary RN and pharmacy notified.

## 2023-10-04 NOTE — Progress Notes (Signed)
 [facility] [address], [place] [address]    Clinic Day:  10/04/23   Referring physician: Rogers Hai, MD  Patient Care Team: Pcp, No as PCP - General Celestia Joesph SQUIBB, RN as Oncology Nurse Navigator (Oncology) Rogers Hai, MD as Medical Oncologist (Medical Oncology)   ASSESSMENT & PLAN:   Assessment: 1.  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 -Mutations were negative for EGFR, ALK, ROS1, RET, BRAF V600 -PD-L1 22 C3 greater than 95%. -He was started on single agent pembrolizumab  every 3 weeks under the direction of Dr. Lamar Salina in [place]  in August 2020, later switched to every 6 weeks. - CT chest (06/21/2022): Irregular solid 1.4 cm left lower lobe lung nodule, slightly increased suspicious for malignancy.  This has been steadily increasing for the last 1 year.  Stable spiculated solid 3.4 cm right upper lobe lung mass.  Mild left axillary and left retrocrural adenopathy stable. - PET scan (08/03/2022): Left lower lobe lung nodule 1.3 cm solid irregular with SUV 1.8.  Hypermetabolic spiculated 3.3 cm right upper lobe lung nodule, SUV 12.  Hypermetabolic left retrocrural and axillary lymph nodes.  Solitary left level 2 neck lymph node. - Right upper lobe lung mass is stable since March 2022.  Steadily increasing left lower lobe lung nodule has very low FDG uptake on PET scan. - Left axillary lymph node biopsy (08/31/2022): Metastatic poorly differentiated adenocarcinoma. - NGS: PD-L1 TPS 100%, no other targetable mutations.  TMB-low.  MSI-stable.  CDK N2A pathogenic variant. - XRT to the left axillary lymph node from 05/08/2023 through 05/28/2023, 40 Gray   2.  Social/family history: -He is a retired naval architect.  Quit smoking in August 2020. -Father had prostate cancer.    Plan: 1.  Stage IV  adenocarcinoma of the lung to the brain, PD-L1 TPS >95%: - He does not report any immunotherapy related side effects. - Labs today: Normal LFTs and creatinine.  CBC grossly normal. - I reviewed the PET scan from 09/27/2023 images.  Right upper lobe hypermetabolic mass is more or less stable in size.  However there is new infection/inflammation/mass in the left upper lobe.  Left axillary lymph nodes have improved. - He reports slight worsening of cough and expectoration with brown sputum.  Will give him Augmentin  and azithromycin .  He may proceed with treatment today.  RTC 6 weeks for follow-up.  Will plan on short interval CT chest in 6 to 12 weeks.  2.  Brain metastasis: - MRI brain on 03/10/2023: No evidence of intracranial metastatic disease.  Will repeat MRI in 1 year.   3.  COPD: - Continue Breo Ellipta  once daily and Ventolin  as needed.  Symptoms are well-controlled.  4.  Hypothyroidism: - TSH was 82 on 08/23/2023.  Synthroid  50 mcg was started on 08/27/2023. - TSH today improved 42.3.  Free T4 is normal at 5.8, up from 1.2.  Continue Synthroid  50 mcg daily.  Will repeat in 6 weeks.    No orders of the defined types were placed in this encounter.     Antonio Payne,acting as a neurosurgeon for Hai Rogers, MD.,have documented all relevant documentation on the behalf of Hai Rogers, MD,as directed by  Hai Rogers, MD while in the presence of Hai Rogers, MD.  I, Hai Rogers MD, have reviewed the above documentation for accuracy and completeness, and  I agree with the above.      Alean Stands, MD   1/2/20254:12 PM  CHIEF COMPLAINT:   Diagnosis: metastatic right lung adenocarcinoma to brain    Cancer Staging  Adenocarcinoma of lung, stage 4, right [facility]) Staging form: Lung, AJCC 8th Edition - Clinical stage from 12/08/2020: Stage IVB (cT2b, cN3, pM1c) - Signed by Stands Alean, MD on 12/08/2020    Prior Therapy: none  Current  Therapy:  Keytruda     HISTORY OF PRESENT ILLNESS:   Oncology History  Adenocarcinoma of lung, stage 4, right (HCC)  12/08/2020 Initial Diagnosis   Adenocarcinoma of lung, stage 4, right (HCC)   12/08/2020 Cancer Staging   Staging form: Lung, AJCC 8th Edition - Clinical stage from 12/08/2020: Stage IVB (cT2b, cN3, pM1c) - Signed by Stands Alean, MD on 12/08/2020 Histopathologic type: Adenocarcinoma, NOS   12/29/2020 - 05/17/2022 Chemotherapy   Patient is on Treatment Plan : LUNG Pembrolizumab  q42d     12/29/2020 -  Chemotherapy   Patient is on Treatment Plan : LUNG Pembrolizumab  (400) q42d        INTERVAL HISTORY:   Antonio Payne is a 68 y.o. male presenting to clinic today for follow up of metastatic right lung adenocarcinoma to brain. He was last seen by me on 07/12/23.  Since his last visit, he underwent restaging PET on 09/27/23.   Today, he states that he is doing well overall. His appetite level is at 100%. His energy level is at 100%. He denies any side effects from infusions. He denies any recent lung infections since his last visit. He reports he has a worsening cough with mucus that is brown colored, which he attributed to the cold weather. He is not allergic to any antibiotics. He notes his daughter-in-law recently had walking pneumonia, though he reportedly kept his distance from her while she was sick. He has been taking Synthroid  daily since it was prescribed on 08/27/23.   PAST MEDICAL HISTORY:   Past Medical History: Past Medical History:  Diagnosis Date   Cancer (HCC)    lung cancer     Surgical History: Past Surgical History:  Procedure Laterality Date   BACK SURGERY      Social History: Social History   Socioeconomic History   Marital status: Single    Spouse name: Not on file   Number of children: Not on file   Years of education: Not on file   Highest education level: Not on file  Occupational History   Not on file  Tobacco Use   Smoking status:  Former    Current packs/day: 0.00    Types: Cigarettes    Quit date: 12/01/2019    Years since quitting: 3.8   Smokeless tobacco: Never  Substance and Sexual Activity   Alcohol use: Never   Drug use: Never   Sexual activity: Not Currently  Other Topics Concern   Not on file  Social History Narrative   Not on file   Social Drivers of Health   Financial Resource Strain: Low Risk  (12/06/2020)   Overall Financial Resource Strain (CARDIA)    Difficulty of Paying Living Expenses: Not hard at all  Food Insecurity: No Food Insecurity (12/06/2020)   Hunger Vital Sign    Worried About Running Out of Food in the Last Year: Never true    Ran Out of Food in the Last Year: Never true  Transportation Needs: No Transportation Needs (12/06/2020)   PRAPARE - Transportation    Lack of Transportation (  Medical): No    Lack of Transportation (Non-Medical): No  Physical Activity: Sufficiently Active (12/06/2020)   Exercise Vital Sign    Days of Exercise per Week: 7 days    Minutes of Exercise per Session: 30 min  Stress: No Stress Concern Present (12/06/2020)   [name] of Occupational Health - Occupational Stress Questionnaire    Feeling of Stress : Only a little  Social Connections: Moderately Integrated (12/06/2020)   Social Connection and Isolation Panel [NHANES]    Frequency of Communication with Friends and Family: More than three times a week    Frequency of Social Gatherings with Friends and Family: More than three times a week    Attends Religious Services: 1 to 4 times per year    Active Member of [employer] or Organizations: No    Attends Banker Meetings: 1 to 4 times per year    Marital Status: Divorced  Catering Manager Violence: Not At Risk (12/06/2020)   Humiliation, Afraid, Rape, and Kick questionnaire    Fear of Current or Ex-Partner: No    Emotionally Abused: No    Physically Abused: No    Sexually Abused: No    Family History: No family history on file.  Current  Medications:  Current Outpatient Medications:    albuterol  (VENTOLIN  HFA) 108 (90 Base) MCG/ACT inhaler, Inhale 2 puffs into the lungs every 4 (four) hours as needed for wheezing or shortness of breath., Disp: 18 g, Rfl: 1   amoxicillin -clavulanate (AUGMENTIN ) 875-125 MG tablet, Take 1 tablet by mouth 2 (two) times daily., Disp: 14 tablet, Rfl: 0   azithromycin  (ZITHROMAX  Z-PAK) 250 MG tablet, Take two tablets by mouth on day 1 and then one tablet by mouth daily until completed., Disp: 6 each, Rfl: 0   BREO ELLIPTA  200-25 MCG/ACT AEPB, INHALE 1 PUFF INTO THE LUNGS EVERY MORNING, Disp: 60 each, Rfl: 11   levothyroxine  (SYNTHROID ) 50 MCG tablet, Take 1 tablet (50 mcg total) by mouth daily before breakfast., Disp: 30 tablet, Rfl: 2 No current facility-administered medications for this visit.  Facility-Administered Medications Ordered in Other Visits:    octreotide  (SANDOSTATIN  LAR) 30 MG IM injection, , , ,    Allergies: No Known Allergies  REVIEW OF SYSTEMS:   Review of Systems  Constitutional:  Negative for chills, fatigue and fever.  HENT:   Negative for lump/mass, mouth sores, nosebleeds, sore throat and trouble swallowing.   Eyes:  Negative for eye problems.  Respiratory:  Negative for cough and shortness of breath.   Cardiovascular:  Negative for chest pain, leg swelling and palpitations.  Gastrointestinal:  Negative for abdominal pain, constipation, diarrhea, nausea and vomiting.  Genitourinary:  Negative for bladder incontinence, difficulty urinating, dysuria, frequency, hematuria and nocturia.   Musculoskeletal:  Negative for arthralgias, back pain, flank pain, myalgias and neck pain.  Skin:  Negative for itching and rash.  Neurological:  Negative for dizziness, headaches and numbness.  Hematological:  Does not bruise/bleed easily.  Psychiatric/Behavioral:  Negative for depression, sleep disturbance and suicidal ideas. The patient is not nervous/anxious.   All other systems  reviewed and are negative.    VITALS:   Pulse 79, resp. rate 18, weight 155 lb 13.8 oz (70.7 kg), SpO2 100%.  Wt Readings from Last 3 Encounters:  10/04/23 155 lb 13.8 oz (70.7 kg)  07/12/23 151 lb (68.5 kg)  05/31/23 146 lb 9.6 oz (66.5 kg)    Body mass index is 23.35 kg/m.  Performance status (ECOG): 1 - Symptomatic  but completely ambulatory  PHYSICAL EXAM:   Physical Exam Vitals and nursing note reviewed. Exam conducted with a chaperone present.  Constitutional:      Appearance: Normal appearance.  Cardiovascular:     Rate and Rhythm: Normal rate and regular rhythm.     Pulses: Normal pulses.     Heart sounds: Normal heart sounds.  Pulmonary:     Effort: Pulmonary effort is normal.     Breath sounds: Normal breath sounds.  Abdominal:     Palpations: Abdomen is soft. There is no hepatomegaly, splenomegaly or mass.     Tenderness: There is no abdominal tenderness.  Musculoskeletal:     Right lower leg: No edema.     Left lower leg: No edema.  Lymphadenopathy:     Cervical: No cervical adenopathy.     Right cervical: No superficial, deep or posterior cervical adenopathy.    Left cervical: No superficial, deep or posterior cervical adenopathy.     Upper Body:     Right upper body: No supraclavicular or axillary adenopathy.     Left upper body: No supraclavicular or axillary adenopathy.  Neurological:     General: No focal deficit present.     Mental Status: He is alert and oriented to person, place, and time.  Psychiatric:        Mood and Affect: Mood normal.        Behavior: Behavior normal.     LABS:      Latest Ref Rng & Units 09/27/2023    8:37 AM 08/23/2023   11:52 AM 07/12/2023   12:01 PM  CBC  WBC 4.0 - 10.5 K/uL 6.1  6.1  6.1   Hemoglobin 13.0 - 17.0 g/dL 86.2  85.0  86.3   Hematocrit 39.0 - 52.0 % 41.0  45.1  41.9   Platelets 150 - 400 K/uL 378  258  243       Latest Ref Rng & Units 09/27/2023    8:37 AM 08/23/2023   11:52 AM 07/12/2023    12:01 PM  CMP  Glucose 70 - 99 mg/dL 884  898  88   BUN 8 - 23 mg/dL 12  13  15    Creatinine 0.61 - 1.24 mg/dL 9.06  8.91  9.22   Sodium 135 - 145 mmol/L 129  129  131   Potassium 3.5 - 5.1 mmol/L 4.4  4.1  4.2   Chloride 98 - 111 mmol/L 93  90  96   CO2 22 - 32 mmol/L 26  29  30    Calcium  8.9 - 10.3 mg/dL 9.0  8.8  8.8   Total Protein 6.5 - 8.1 g/dL 7.6  7.6  6.6   Total Bilirubin <1.2 mg/dL 0.8  0.7  0.4   Alkaline Phos 38 - 126 U/L 71  77  74   AST 15 - 41 U/L 27  42  19   ALT 0 - 44 U/L 14  25  15       No results found for: CEA1, CEA / No results found for: CEA1, CEA No results found for: PSA1 No results found for: CAN199 No results found for: CAN125  No results found for: TOTALPROTELP, ALBUMINELP, A1GS, A2GS, BETS, BETA2SER, GAMS, MSPIKE, SPEI No results found for: TIBC, FERRITIN, IRONPCTSAT No results found for: LDH   STUDIES:   No results found.

## 2023-10-04 NOTE — Progress Notes (Signed)
 Patient okay for Keytruda  per Dr. Rogers. Patient tolerated therapy with no complaints voiced. Side effects with management reviewed with understanding verbalized. IV site clean and dry with no bruising or swelling noted at site. Good blood return noted before and after administration of therapy. Band aid applied. Patient left in satisfactory condition with VSS and no s/s of distress noted.

## 2023-10-04 NOTE — Patient Instructions (Signed)
 [facility] [place] - A DEPT OF [facility]  Discharge Instructions: Thank you for choosing [facility] to provide your oncology and hematology care.  If you have a lab appointment with the Cancer Center - please note that after April 8th, 2024, all labs will be drawn in the cancer center.  You do not have to check in or register with the main entrance as you have in the past but will complete your check-in in the cancer center.  Wear comfortable clothing and clothing appropriate for easy access to any Portacath or PICC line.   We strive to give you quality time with your provider. You may need to reschedule your appointment if you arrive late (15 or more minutes).  Arriving late affects you and other patients whose appointments are after yours.  Also, if you miss three or more appointments without notifying the office, you may be dismissed from the clinic at the provider's discretion.      For prescription refill requests, have your pharmacy contact our office and allow 72 hours for refills to be completed.    Today you received the following chemotherapy and/or immunotherapy agents Keytruda, return as scheduled.   To help prevent nausea and vomiting after your treatment, we encourage you to take your nausea medication as directed.  BELOW ARE SYMPTOMS THAT SHOULD BE REPORTED IMMEDIATELY: *FEVER GREATER THAN 100.4 F (38 C) OR HIGHER *CHILLS OR SWEATING *NAUSEA AND VOMITING THAT IS NOT CONTROLLED WITH YOUR NAUSEA MEDICATION *UNUSUAL SHORTNESS OF BREATH *UNUSUAL BRUISING OR BLEEDING *URINARY PROBLEMS (pain or burning when urinating, or frequent urination) *BOWEL PROBLEMS (unusual diarrhea, constipation, pain near the anus) TENDERNESS IN MOUTH AND THROAT WITH OR WITHOUT PRESENCE OF ULCERS (sore throat, sores in mouth, or a toothache) UNUSUAL RASH, SWELLING OR PAIN  UNUSUAL VAGINAL DISCHARGE OR ITCHING   Items with * indicate a potential emergency and  should be followed up as soon as possible or go to the Emergency Department if any problems should occur.  Please show the CHEMOTHERAPY ALERT CARD or IMMUNOTHERAPY ALERT CARD at check-in to the Emergency Department and triage nurse.  Should you have questions after your visit or need to cancel or reschedule your appointment, please contact [facility] [place] - A DEPT OF [facility] [phone]  and follow the prompts.  Office hours are 8:00 a.m. to 4:30 p.m. Monday - Friday. Please note that voicemails left after 4:00 p.m. may not be returned until the following business day.  We are closed weekends and major holidays. You have access to a nurse at all times for urgent questions. Please call the main number to the clinic [phone] and follow the prompts.  For any non-urgent questions, you may also contact your provider using MyChart. We now offer e-Visits for anyone 58 and older to request care online for non-urgent symptoms. For details visit [email or web address].   Also download the MyChart app! Go to the app store, search "MyChart", open the app, select [place], and log in with your MyChart username and password.

## 2023-10-04 NOTE — Patient Instructions (Signed)
 [facility] at [facility] Discharge Instructions   You were seen and examined today by Dr. Rogers.  He reviewed the results of your lab work which are normal/stable.   He reviewed the images of your PET scan. There is a new spot on the left lung that could be attributed to infection/inflammation. The spot on the right lung is stable. The lymph nodes have resolved.   We will proceed with your treatment today.   Return as scheduled.    Thank you for choosing [facility] at [facility] to provide your oncology and hematology care.  To afford each patient quality time with our provider, please arrive at least 15 minutes before your scheduled appointment time.   If you have a lab appointment with the Cancer Center please come in thru the Main Entrance and check in at the main information desk.  You need to re-schedule your appointment should you arrive 10 or more minutes late.  We strive to give you quality time with our providers, and arriving late affects you and other patients whose appointments are after yours.  Also, if you no show three or more times for appointments you may be dismissed from the clinic at the providers discretion.     Again, thank you for choosing [facility].  Our hope is that these requests will decrease the amount of time that you wait before being seen by our physicians.       _____________________________________________________________  Should you have questions after your visit to [facility], please contact our office at [phone] and follow the prompts.  Our office hours are 8:00 a.m. and 4:30 p.m. Monday - Friday.  Please note that voicemails left after 4:00 p.m. may not be returned until the following business day.  We are closed weekends and major holidays.  You do have access to a nurse 24-7, just call the main number to the clinic [phone] and do not press any options, hold on  the line and a nurse will answer the phone.    For prescription refill requests, have your pharmacy contact our office and allow 72 hours.    Due to Covid, you will need to wear a mask upon entering the hospital. If you do not have a mask, a mask will be given to you at the Main Entrance upon arrival. For doctor visits, patients may have 1 support person age 44 or older with them. For treatment visits, patients can not have anyone with them due to social distancing guidelines and our immunocompromised population.

## 2023-10-05 ENCOUNTER — Other Ambulatory Visit: Payer: Self-pay

## 2023-10-19 ENCOUNTER — Other Ambulatory Visit: Payer: Self-pay

## 2023-11-09 ENCOUNTER — Other Ambulatory Visit: Payer: Self-pay

## 2023-11-14 NOTE — Progress Notes (Signed)
[facility] [address] [place] [address]    Clinic Day:  11/15/23   Referring physician: [name], MD  Patient Care Team: Pcp, No as PCP - General [name], RN as Oncology Nurse Navigator (Oncology) [name], MD as Medical Oncologist (Medical Oncology)   ASSESSMENT & PLAN:   Assessment: 1.  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 -Mutations were negative for EGFR, ALK, ROS1, RET, BRAF V600 -PD-L1 22 C3 greater than 95%. -He was started on single agent pembrolizumab every 3 weeks under the direction of Dr. Alain Marion in [place] in August 2020, later switched to every 6 weeks. - CT chest (06/21/2022): Irregular solid 1.4 cm left lower lobe lung nodule, slightly increased suspicious for malignancy.  This has been steadily increasing for the last 1 year.  Stable spiculated solid 3.4 cm right upper lobe lung mass.  Mild left axillary and left retrocrural adenopathy stable. - PET scan (08/03/2022): Left lower lobe lung nodule 1.3 cm solid irregular with SUV 1.8.  Hypermetabolic spiculated 3.3 cm right upper lobe lung nodule, SUV 12.  Hypermetabolic left retrocrural and axillary lymph nodes.  Solitary left level 2 neck lymph node. - Right upper lobe lung mass is stable since March 2022.  Steadily increasing left lower lobe lung nodule has very low FDG uptake on PET scan. - Left axillary lymph node biopsy (08/31/2022): Metastatic poorly differentiated adenocarcinoma. - NGS: PD-L1 TPS 100%, no other targetable mutations.  TMB-low.  MSI-stable.  CDK N2A pathogenic variant. - XRT to the left axillary lymph node from 05/08/2023 through 05/28/2023, 40 Gray   2.  Social/family history: -He is a retired Naval architect.  Quit smoking in August 2020. -Father had prostate cancer.    Plan: 1.  Stage IV  adenocarcinoma of the lung to the brain, PD-L1 TPS >95%: - PET scan (09/27/2023): Right upper lobe hypermetabolic mass is stable.  However there is new inflammation/infection/mass in the left upper lobe. - Reviewed labs today: Normal LFTs and creatinine.  CBC normal. - Denies any immunotherapy related side effects. - He may proceed with treatment today.  RTC 6 weeks for follow-up.  Will plan on repeating CT chest with contrast prior to next visit.  2.  Brain metastasis: - MRI of the brain on 03/10/2023: No evidence of intracranial metastatic disease.  Will repeat MRI in 1 year.   3.  COPD: - Continue Breo Ellipta once daily and Ventolin as needed.  Symptoms are well-controlled.  4.  Hypothyroidism: - Continue Synthroid 50 mcg daily.  TSH is 7.8, down from 42.  Will repeat TSH at next visit.    Orders Placed This Encounter  Procedures   CT CHEST W CONTRAST    Standing Status:   Future    Expected Date:   12/13/2023    Expiration Date:   11/14/2024    If indicated for the ordered procedure, I authorize the administration of contrast media per Radiology protocol:   Yes    Does the patient have a contrast media/X-ray dye allergy?:   No    Preferred imaging location?:   [facility]   CBC with Differential    Standing Status:   Future    Expected Date:   12/27/2023    Expiration Date:   12/27/2024   Comprehensive metabolic panel    Standing Status:  Future    Expected Date:   12/27/2023    Expiration Date:   12/27/2024   T4    Standing Status:   Future    Expected Date:   12/27/2023    Expiration Date:   12/27/2024   TSH    Standing Status:   Future    Expected Date:   12/27/2023    Expiration Date:   12/27/2024   CBC with Differential    Standing Status:   Future    Expected Date:   02/07/2024    Expiration Date:   02/07/2025   Comprehensive metabolic panel    Standing Status:   Future    Expected Date:   02/07/2024    Expiration Date:   02/07/2025   T4    Standing Status:   Future     Expected Date:   02/07/2024    Expiration Date:   02/07/2025   TSH    Standing Status:   Future    Expected Date:   02/07/2024    Expiration Date:   02/07/2025      [name],acting as a scribe for [name], MD.,have documented all relevant documentation on the behalf of [name], MD,as directed by  [name], MD while in the presence of [name], MD.  I, [name] MD, have reviewed the above documentation for accuracy and completeness, and I agree with the above.     [name], MD   2/13/20251:07 PM  CHIEF COMPLAINT:   Diagnosis: metastatic right lung adenocarcinoma to brain    Cancer Staging  Adenocarcinoma of lung, stage 4, right [facility]) Staging form: Lung, AJCC 8th Edition - Clinical stage from 12/08/2020: Stage IVB (cT2b, cN3, pM1c) - Signed by [name], MD on 12/08/2020    Prior Therapy: none  Current Therapy:  Keytruda    HISTORY OF PRESENT ILLNESS:   Oncology History  Adenocarcinoma of lung, stage 4, right (HCC)  12/08/2020 Initial Diagnosis   Adenocarcinoma of lung, stage 4, right (HCC)   12/08/2020 Cancer Staging   Staging form: Lung, AJCC 8th Edition - Clinical stage from 12/08/2020: Stage IVB (cT2b, cN3, pM1c) - Signed by [name], MD on 12/08/2020 Histopathologic type: Adenocarcinoma, NOS   12/29/2020 - 05/17/2022 Chemotherapy   Patient is on Treatment Plan : LUNG Pembrolizumab q42d     12/29/2020 -  Chemotherapy   Patient is on Treatment Plan : LUNG Pembrolizumab (400) q42d        INTERVAL HISTORY:   Antonio Payne is a 68 y.o. male presenting to clinic today for follow up of metastatic right lung adenocarcinoma to brain. He was last seen by me on 10/04/23.  Today, he states that he is doing well overall. His appetite level is at 100%. His energy level is at 100%. He denies any infections, changes in baseline cough, diarrhea, SOB, or skin rashes. He is taking Synthroid as  prescribed. Augmentin and Azithromycin prescribed at last visit for worsening cough and brown sputum resolved symptoms.   PAST MEDICAL HISTORY:   Past Medical History: Past Medical History:  Diagnosis Date   Cancer (HCC)    lung cancer     Surgical History: Past Surgical History:  Procedure Laterality Date   BACK SURGERY      Social History: Social History   Socioeconomic History   Marital status: Single    Spouse name: Not on file   Number of children: Not on file   Years of education: Not on file  Highest education level: Not on file  Occupational History   Not on file  Tobacco Use   Smoking status: Former    Current packs/day: 0.00    Types: Cigarettes    Quit date: 12/01/2019    Years since quitting: 3.9   Smokeless tobacco: Never  Substance and Sexual Activity   Alcohol use: Never   Drug use: Never   Sexual activity: Not Currently  Other Topics Concern   Not on file  Social History Narrative   Not on file   Social Drivers of Health   Financial Resource Strain: Low Risk  (12/06/2020)   Overall Financial Resource Strain (CARDIA)    Difficulty of Paying Living Expenses: Not hard at all  Food Insecurity: No Food Insecurity (12/06/2020)   Hunger Vital Sign    Worried About Running Out of Food in the Last Year: Never true    Ran Out of Food in the Last Year: Never true  Transportation Needs: No Transportation Needs (12/06/2020)   PRAPARE - Administrator, Civil Service (Medical): No    Lack of Transportation (Non-Medical): No  Physical Activity: Sufficiently Active (12/06/2020)   Exercise Vital Sign    Days of Exercise per Week: 7 days    Minutes of Exercise per Session: 30 min  Stress: No Stress Concern Present (12/06/2020)   [name] of Occupational Health - Occupational Stress Questionnaire    Feeling of Stress : Only a little  Social Connections: Moderately Integrated (12/06/2020)   Social Connection and Isolation Panel [NHANES]    Frequency  of Communication with Friends and Family: More than three times a week    Frequency of Social Gatherings with Friends and Family: More than three times a week    Attends Religious Services: 1 to 4 times per year    Active Member of [employer] or Organizations: No    Attends Banker Meetings: 1 to 4 times per year    Marital Status: Divorced  Catering manager Violence: Not At Risk (12/06/2020)   Humiliation, Afraid, Rape, and Kick questionnaire    Fear of Current or Ex-Partner: No    Emotionally Abused: No    Physically Abused: No    Sexually Abused: No    Family History: No family history on file.  Current Medications:  Current Outpatient Medications:    azithromycin (ZITHROMAX Z-PAK) 250 MG tablet, Take two tablets by mouth on day 1 and then one tablet by mouth daily until completed., Disp: 6 each, Rfl: 0   BREO ELLIPTA 200-25 MCG/ACT AEPB, INHALE 1 PUFF INTO THE LUNGS EVERY MORNING, Disp: 60 each, Rfl: 11   levothyroxine (SYNTHROID) 50 MCG tablet, Take 1 tablet (50 mcg total) by mouth daily before breakfast., Disp: 30 tablet, Rfl: 2   albuterol (VENTOLIN HFA) 108 (90 Base) MCG/ACT inhaler, Inhale 2 puffs into the lungs every 4 (four) hours as needed for wheezing or shortness of breath., Disp: 18 g, Rfl: 3 No current facility-administered medications for this visit.  Facility-Administered Medications Ordered in Other Visits:    heparin lock flush 100 unit/mL, 500 Units, Intracatheter, Once PRN, [name], MD   octreotide (SANDOSTATIN LAR) 30 MG IM injection, , , ,    sodium chloride flush (NS) 0.9 % injection 10 mL, 10 mL, Intracatheter, PRN, [name], MD   Allergies: No Known Allergies  REVIEW OF SYSTEMS:   Review of Systems  Constitutional:  Negative for chills, fatigue and fever.  HENT:   Negative for lump/mass,  mouth sores, nosebleeds, sore throat and trouble swallowing.   Eyes:  Negative for eye problems.  Respiratory:  Positive for wheezing.  Negative for cough and shortness of breath.   Cardiovascular:  Negative for chest pain, leg swelling and palpitations.  Gastrointestinal:  Negative for abdominal pain, constipation, diarrhea, nausea and vomiting.  Genitourinary:  Negative for bladder incontinence, difficulty urinating, dysuria, frequency, hematuria and nocturia.   Musculoskeletal:  Negative for arthralgias, back pain, flank pain, myalgias and neck pain.  Skin:  Negative for itching and rash.  Neurological:  Negative for dizziness, headaches and numbness.  Hematological:  Does not bruise/bleed easily.  Psychiatric/Behavioral:  Negative for depression, sleep disturbance and suicidal ideas. The patient is not nervous/anxious.   All other systems reviewed and are negative.    VITALS:   Blood pressure (!) 144/87, pulse 85, temperature (!) 97.4 F (36.3 C), temperature source Tympanic, resp. rate 18, height 5\' 9"  (1.753 m), weight 148 lb (67.1 kg), SpO2 97%.  Wt Readings from Last 3 Encounters:  11/15/23 148 lb (67.1 kg)  10/04/23 155 lb 13.8 oz (70.7 kg)  07/12/23 151 lb (68.5 kg)    Body mass index is 21.86 kg/m.  Performance status (ECOG): 1 - Symptomatic but completely ambulatory  PHYSICAL EXAM:   Physical Exam Vitals and nursing note reviewed. Exam conducted with a chaperone present.  Constitutional:      Appearance: Normal appearance.  Cardiovascular:     Rate and Rhythm: Normal rate and regular rhythm.     Pulses: Normal pulses.     Heart sounds: Normal heart sounds.  Pulmonary:     Effort: Pulmonary effort is normal.     Breath sounds: Normal breath sounds.  Abdominal:     Palpations: Abdomen is soft. There is no hepatomegaly, splenomegaly or mass.     Tenderness: There is no abdominal tenderness.  Musculoskeletal:     Right lower leg: No edema.     Left lower leg: No edema.  Lymphadenopathy:     Cervical: No cervical adenopathy.     Right cervical: No superficial, deep or posterior cervical  adenopathy.    Left cervical: No superficial, deep or posterior cervical adenopathy.     Upper Body:     Right upper body: No supraclavicular or axillary adenopathy.     Left upper body: No supraclavicular or axillary adenopathy.  Neurological:     General: No focal deficit present.     Mental Status: He is alert and oriented to person, place, and time.  Psychiatric:        Mood and Affect: Mood normal.        Behavior: Behavior normal.     LABS:      Latest Ref Rng & Units 11/15/2023    9:34 AM 09/27/2023    8:37 AM 08/23/2023   11:52 AM  CBC  WBC 4.0 - 10.5 K/uL 6.4  6.1  6.1   Hemoglobin 13.0 - 17.0 g/dL 16.1  09.6  04.5   Hematocrit 39.0 - 52.0 % 41.3  41.0  45.1   Platelets 150 - 400 K/uL 231  378  258       Latest Ref Rng & Units 11/15/2023    9:34 AM 09/27/2023    8:37 AM 08/23/2023   11:52 AM  CMP  Glucose 70 - 99 mg/dL 409  811  914   BUN 8 - 23 mg/dL 17  12  13    Creatinine 0.61 - 1.24 mg/dL 7.82  9.56  1.08   Sodium 135 - 145 mmol/L 133  129  129   Potassium 3.5 - 5.1 mmol/L 4.5  4.4  4.1   Chloride 98 - 111 mmol/L 98  93  90   CO2 22 - 32 mmol/L 27  26  29    Calcium 8.9 - 10.3 mg/dL 9.1  9.0  8.8   Total Protein 6.5 - 8.1 g/dL 7.5  7.6  7.6   Total Bilirubin 0.0 - 1.2 mg/dL 0.7  0.8  0.7   Alkaline Phos 38 - 126 U/L 75  71  77   AST 15 - 41 U/L 21  27  42   ALT 0 - 44 U/L 16  14  25       No results found for: "CEA1", "CEA" / No results found for: "CEA1", "CEA" No results found for: "PSA1" No results found for: "ZOX096" No results found for: "CAN125"  No results found for: "TOTALPROTELP", "ALBUMINELP", "A1GS", "A2GS", "BETS", "BETA2SER", "GAMS", "MSPIKE", "SPEI" No results found for: "TIBC", "FERRITIN", "IRONPCTSAT" No results found for: "LDH"   STUDIES:   No results found.

## 2023-11-15 ENCOUNTER — Encounter: Payer: Self-pay | Admitting: Hematology

## 2023-11-15 ENCOUNTER — Inpatient Hospital Stay: Payer: Medicare Other | Admitting: Hematology

## 2023-11-15 ENCOUNTER — Inpatient Hospital Stay: Payer: Medicare Other

## 2023-11-15 ENCOUNTER — Inpatient Hospital Stay: Payer: Medicare Other | Attending: Hematology

## 2023-11-15 VITALS — BP 144/87 | HR 85 | Temp 97.4°F | Resp 18 | Ht 69.0 in | Wt 148.0 lb

## 2023-11-15 VITALS — BP 140/73 | HR 92 | Temp 97.4°F | Resp 18

## 2023-11-15 DIAGNOSIS — C3411 Malignant neoplasm of upper lobe, right bronchus or lung: Secondary | ICD-10-CM | POA: Diagnosis present

## 2023-11-15 DIAGNOSIS — J449 Chronic obstructive pulmonary disease, unspecified: Secondary | ICD-10-CM | POA: Insufficient documentation

## 2023-11-15 DIAGNOSIS — C3491 Malignant neoplasm of unspecified part of right bronchus or lung: Secondary | ICD-10-CM

## 2023-11-15 DIAGNOSIS — Z5112 Encounter for antineoplastic immunotherapy: Secondary | ICD-10-CM | POA: Insufficient documentation

## 2023-11-15 DIAGNOSIS — Z79890 Hormone replacement therapy (postmenopausal): Secondary | ICD-10-CM | POA: Diagnosis not present

## 2023-11-15 DIAGNOSIS — C7931 Secondary malignant neoplasm of brain: Secondary | ICD-10-CM | POA: Diagnosis not present

## 2023-11-15 DIAGNOSIS — E039 Hypothyroidism, unspecified: Secondary | ICD-10-CM | POA: Diagnosis not present

## 2023-11-15 LAB — CBC WITH DIFFERENTIAL/PLATELET
Abs Immature Granulocytes: 0.02 10*3/uL (ref 0.00–0.07)
Basophils Absolute: 0.1 10*3/uL (ref 0.0–0.1)
Basophils Relative: 1 %
Eosinophils Absolute: 0.5 10*3/uL (ref 0.0–0.5)
Eosinophils Relative: 8 %
HCT: 41.3 % (ref 39.0–52.0)
Hemoglobin: 13.3 g/dL (ref 13.0–17.0)
Immature Granulocytes: 0 %
Lymphocytes Relative: 12 %
Lymphs Abs: 0.7 10*3/uL (ref 0.7–4.0)
MCH: 31.4 pg (ref 26.0–34.0)
MCHC: 32.2 g/dL (ref 30.0–36.0)
MCV: 97.4 fL (ref 80.0–100.0)
Monocytes Absolute: 0.6 10*3/uL (ref 0.1–1.0)
Monocytes Relative: 9 %
Neutro Abs: 4.5 10*3/uL (ref 1.7–7.7)
Neutrophils Relative %: 70 %
Platelets: 231 10*3/uL (ref 150–400)
RBC: 4.24 MIL/uL (ref 4.22–5.81)
RDW: 13.4 % (ref 11.5–15.5)
WBC: 6.4 10*3/uL (ref 4.0–10.5)
nRBC: 0 % (ref 0.0–0.2)

## 2023-11-15 LAB — COMPREHENSIVE METABOLIC PANEL
ALT: 16 U/L (ref 0–44)
AST: 21 U/L (ref 15–41)
Albumin: 4.3 g/dL (ref 3.5–5.0)
Alkaline Phosphatase: 75 U/L (ref 38–126)
Anion gap: 8 (ref 5–15)
BUN: 17 mg/dL (ref 8–23)
CO2: 27 mmol/L (ref 22–32)
Calcium: 9.1 mg/dL (ref 8.9–10.3)
Chloride: 98 mmol/L (ref 98–111)
Creatinine, Ser: 0.86 mg/dL (ref 0.61–1.24)
GFR, Estimated: >60 mL/min (ref >60)
Glucose, Bld: 133 mg/dL — ABNORMAL HIGH (ref 70–99)
Potassium: 4.5 mmol/L (ref 3.5–5.1)
Sodium: 133 mmol/L — ABNORMAL LOW (ref 135–145)
Total Bilirubin: 0.7 mg/dL (ref 0.0–1.2)
Total Protein: 7.5 g/dL (ref 6.5–8.1)

## 2023-11-15 LAB — TSH: TSH: 7.892 u[IU]/mL — ABNORMAL HIGH (ref 0.350–4.500)

## 2023-11-15 MED ORDER — SODIUM CHLORIDE 0.9 % IV SOLN: Freq: Once | INTRAVENOUS | Status: AC

## 2023-11-15 MED ORDER — ALBUTEROL SULFATE HFA 108 (90 BASE) MCG/ACT IN AERS: 2.0000 | INHALATION_SPRAY | RESPIRATORY_TRACT | 3 refills | Status: DC | PRN

## 2023-11-15 MED ORDER — SODIUM CHLORIDE 0.9% FLUSH: 10.0000 mL | INTRAVENOUS | Status: DC | PRN

## 2023-11-15 MED ORDER — SODIUM CHLORIDE 0.9 % IV SOLN
400.0000 mg | Freq: Once | INTRAVENOUS | Status: AC
  Administered 2023-11-15: 400 mg via INTRAVENOUS
  Filled 2023-11-15: qty 16

## 2023-11-15 MED ORDER — HEPARIN SOD (PORK) LOCK FLUSH 100 UNIT/ML IV SOLN: 500.0000 [IU] | Freq: Once | INTRAVENOUS | Status: DC | PRN

## 2023-11-15 NOTE — Patient Instructions (Signed)

## 2023-11-15 NOTE — Patient Instructions (Signed)
[facility] [place] - A DEPT OF [facility]  Discharge Instructions: Thank you for choosing [facility] to provide your oncology and hematology care.  If you have a lab appointment with the Cancer Center - please note that after April 8th, 2024, all labs will be drawn in the cancer center.  You do not have to check in or register with the main entrance as you have in the past but will complete your check-in in the cancer center.  Wear comfortable clothing and clothing appropriate for easy access to any Portacath or PICC line.   We strive to give you quality time with your provider. You may need to reschedule your appointment if you arrive late (15 or more minutes).  Arriving late affects you and other patients whose appointments are after yours.  Also, if you miss three or more appointments without notifying the office, you may be dismissed from the clinic at the provider's discretion.      For prescription refill requests, have your pharmacy contact our office and allow 72 hours for refills to be completed.    Today you received the following chemotherapy and/or immunotherapy agents Keytruda      To help prevent nausea and vomiting after your treatment, we encourage you to take your nausea medication as directed.  BELOW ARE SYMPTOMS THAT SHOULD BE REPORTED IMMEDIATELY: *FEVER GREATER THAN 100.4 F (38 C) OR HIGHER *CHILLS OR SWEATING *NAUSEA AND VOMITING THAT IS NOT CONTROLLED WITH YOUR NAUSEA MEDICATION *UNUSUAL SHORTNESS OF BREATH *UNUSUAL BRUISING OR BLEEDING *URINARY PROBLEMS (pain or burning when urinating, or frequent urination) *BOWEL PROBLEMS (unusual diarrhea, constipation, pain near the anus) TENDERNESS IN MOUTH AND THROAT WITH OR WITHOUT PRESENCE OF ULCERS (sore throat, sores in mouth, or a toothache) UNUSUAL RASH, SWELLING OR PAIN  UNUSUAL VAGINAL DISCHARGE OR ITCHING   Items with * indicate a potential emergency and should be followed up  as soon as possible or go to the Emergency Department if any problems should occur.  Please show the CHEMOTHERAPY ALERT CARD or IMMUNOTHERAPY ALERT CARD at check-in to the Emergency Department and triage nurse.  Should you have questions after your visit or need to cancel or reschedule your appointment, please contact [facility]  - A DEPT OF [facility] [phone]  and follow the prompts.  Office hours are 8:00 a.m. to 4:30 p.m. Monday - Friday. Please note that voicemails left after 4:00 p.m. may not be returned until the following business day.  We are closed weekends and major holidays. You have access to a nurse at all times for urgent questions. Please call the main number to the clinic [phone] and follow the prompts.  For any non-urgent questions, you may also contact your provider using MyChart. We now offer e-Visits for anyone 30 and older to request care online for non-urgent symptoms. For details visit [email or web address].   Also download the MyChart app! Go to the app store, search "MyChart", open the app, select [place], and log in with your MyChart username and password.

## 2023-11-15 NOTE — Progress Notes (Signed)
Patient presents today for Keytruda infusion per providers order.  Vital signs and labs reviewed by MD.  Message received from Chapman Moss RN/Dr. Ellin Saba, patient okay for treatment. Treatment given today per MD orders.  Keytruda infusion without adverse affects.  Vital signs stable.  No complaints at this time.  Discharge from clinic ambulatory in stable condition.  Alert and oriented X 3.  Follow up with [facility] as scheduled.

## 2023-11-15 NOTE — Progress Notes (Signed)
Patient has been examined by Dr. Ellin Saba. Vital signs and labs have been reviewed by MD - ANC, Creatinine, LFTs, hemoglobin, and platelets are within treatment parameters per M.D. - pt may proceed with treatment.  Primary RN and pharmacy notified.

## 2023-11-16 LAB — T4: T4, Total: 8.3 ug/dL (ref 4.5–12.0)

## 2023-11-17 ENCOUNTER — Other Ambulatory Visit: Payer: Self-pay

## 2023-11-26 ENCOUNTER — Other Ambulatory Visit: Payer: Self-pay | Admitting: Oncology

## 2023-12-07 ENCOUNTER — Other Ambulatory Visit: Payer: Self-pay

## 2023-12-14 ENCOUNTER — Other Ambulatory Visit: Payer: Self-pay

## 2023-12-17 ENCOUNTER — Ambulatory Visit (HOSPITAL_COMMUNITY)
Admission: RE | Admit: 2023-12-17 | Discharge: 2023-12-17 | Disposition: A | Discharge status: Home / Self Care | Payer: Medicare Other | Source: Ambulatory Visit | Attending: Hematology | Admitting: Hematology

## 2023-12-17 DIAGNOSIS — C3491 Malignant neoplasm of unspecified part of right bronchus or lung: Secondary | ICD-10-CM | POA: Insufficient documentation

## 2023-12-17 MED ORDER — IOHEXOL 300 MG/ML  SOLN
80.0000 mL | Freq: Once | INTRAMUSCULAR | Status: AC | PRN
  Administered 2023-12-17: 80 mL via INTRAVENOUS

## 2023-12-25 ENCOUNTER — Other Ambulatory Visit: Payer: Self-pay

## 2023-12-26 ENCOUNTER — Inpatient Hospital Stay: Payer: Medicare Other

## 2023-12-26 ENCOUNTER — Other Ambulatory Visit: Payer: Self-pay | Admitting: Hematology

## 2023-12-26 ENCOUNTER — Inpatient Hospital Stay (HOSPITAL_BASED_OUTPATIENT_CLINIC_OR_DEPARTMENT_OTHER): Payer: Medicare Other | Admitting: Hematology

## 2023-12-26 ENCOUNTER — Inpatient Hospital Stay: Payer: Medicare Other | Attending: Hematology

## 2023-12-26 VITALS — BP 153/85 | HR 84 | Temp 97.4°F | Resp 18 | Wt 146.0 lb

## 2023-12-26 VITALS — BP 143/78 | HR 91 | Temp 98.2°F | Resp 18

## 2023-12-26 DIAGNOSIS — Z79620 Long term (current) use of immunosuppressive biologic: Secondary | ICD-10-CM | POA: Insufficient documentation

## 2023-12-26 DIAGNOSIS — C7931 Secondary malignant neoplasm of brain: Secondary | ICD-10-CM | POA: Diagnosis not present

## 2023-12-26 DIAGNOSIS — Z5112 Encounter for antineoplastic immunotherapy: Secondary | ICD-10-CM | POA: Diagnosis present

## 2023-12-26 DIAGNOSIS — C3411 Malignant neoplasm of upper lobe, right bronchus or lung: Secondary | ICD-10-CM | POA: Insufficient documentation

## 2023-12-26 DIAGNOSIS — C3491 Malignant neoplasm of unspecified part of right bronchus or lung: Secondary | ICD-10-CM

## 2023-12-26 LAB — CBC WITH DIFFERENTIAL/PLATELET
Abs Immature Granulocytes: 0.01 10*3/uL (ref 0.00–0.07)
Basophils Absolute: 0 10*3/uL (ref 0.0–0.1)
Basophils Relative: 1 %
Eosinophils Absolute: 0.4 10*3/uL (ref 0.0–0.5)
Eosinophils Relative: 6 %
HCT: 42.2 % (ref 39.0–52.0)
Hemoglobin: 14 g/dL (ref 13.0–17.0)
Immature Granulocytes: 0 %
Lymphocytes Relative: 16 %
Lymphs Abs: 1 10*3/uL (ref 0.7–4.0)
MCH: 31.5 pg (ref 26.0–34.0)
MCHC: 33.2 g/dL (ref 30.0–36.0)
MCV: 95 fL (ref 80.0–100.0)
Monocytes Absolute: 0.8 10*3/uL (ref 0.1–1.0)
Monocytes Relative: 12 %
Neutro Abs: 4 10*3/uL (ref 1.7–7.7)
Neutrophils Relative %: 65 %
Platelets: 239 10*3/uL (ref 150–400)
RBC: 4.44 MIL/uL (ref 4.22–5.81)
RDW: 12.3 % (ref 11.5–15.5)
WBC: 6.2 10*3/uL (ref 4.0–10.5)
nRBC: 0 % (ref 0.0–0.2)

## 2023-12-26 LAB — COMPREHENSIVE METABOLIC PANEL
ALT: 14 U/L (ref 0–44)
AST: 20 U/L (ref 15–41)
Albumin: 4.1 g/dL (ref 3.5–5.0)
Alkaline Phosphatase: 71 U/L (ref 38–126)
Anion gap: 9 (ref 5–15)
BUN: 14 mg/dL (ref 8–23)
CO2: 29 mmol/L (ref 22–32)
Calcium: 9.4 mg/dL (ref 8.9–10.3)
Chloride: 95 mmol/L — ABNORMAL LOW (ref 98–111)
Creatinine, Ser: 0.83 mg/dL (ref 0.61–1.24)
GFR, Estimated: >60 mL/min (ref >60)
Glucose, Bld: 103 mg/dL — ABNORMAL HIGH (ref 70–99)
Potassium: 4.4 mmol/L (ref 3.5–5.1)
Sodium: 133 mmol/L — ABNORMAL LOW (ref 135–145)
Total Bilirubin: 0.4 mg/dL (ref 0.0–1.2)
Total Protein: 7.3 g/dL (ref 6.5–8.1)

## 2023-12-26 LAB — TSH: TSH: 15.367 u[IU]/mL — ABNORMAL HIGH (ref 0.350–4.500)

## 2023-12-26 MED ORDER — SODIUM CHLORIDE 0.9 % IV SOLN: Freq: Once | INTRAVENOUS | Status: AC

## 2023-12-26 MED ORDER — SODIUM CHLORIDE 0.9% FLUSH
10.0000 mL | INTRAVENOUS | Status: DC | PRN
Start: 2023-12-26 — End: 2023-12-26

## 2023-12-26 MED ORDER — HEPARIN SOD (PORK) LOCK FLUSH 100 UNIT/ML IV SOLN
500.0000 [IU] | Freq: Once | INTRAVENOUS | Status: DC | PRN
Start: 2023-12-26 — End: 2023-12-26

## 2023-12-26 MED ORDER — SODIUM CHLORIDE 0.9 % IV SOLN
400.0000 mg | Freq: Once | INTRAVENOUS | Status: AC
  Administered 2023-12-26: 400 mg via INTRAVENOUS
  Filled 2023-12-26: qty 16

## 2023-12-26 MED ORDER — LEVOTHYROXINE SODIUM 50 MCG PO TABS: 50.0000 ug | ORAL_TABLET | Freq: Every day | ORAL | 3 refills | Status: DC

## 2023-12-26 NOTE — Progress Notes (Signed)
 Patient presents today for Keytruda infusion per providers order.  Vital signs and labs reviewed by MD.  Message received from Chapman Moss RN/Katragadda, patient okay for treatment.  Peripheral IV started and blood return noted pre and post infusion.  Treatment given today per MD orders.  Stable during infusion without adverse affects.  Vital signs stable.  No complaints at this time.  Discharge from clinic ambulatory in stable condition.  Alert and oriented X 3.  Follow up with [facility] as scheduled.

## 2023-12-26 NOTE — Patient Instructions (Addendum)
 [facility] at [facility] Discharge Instructions   You were seen and examined today by Dr. Ellin Saba.  He reviewed the results of your lab work which are mostly normal/stable. Your TSH is elevated. You need to start back on the levothyroxine.   He reviewed the results of your CT scan. One spot has grown slightly in size and one spot has decreased in size. There is a new tiny spot that may be a slow growing cancer. This does not need to be addressed at this time. If it continues to grow we will plan to treat this down the line with radiation.   We will proceed with your treatment today and in 6 weeks.   We will see you back in 12 weeks. We will repeat a CT scan of the chest prior to this visit.   Return as scheduled.    Thank you for choosing [facility] at [facility] to provide your oncology and hematology care.  To afford each patient quality time with our provider, please arrive at least 15 minutes before your scheduled appointment time.   If you have a lab appointment with the Cancer Center please come in thru the Main Entrance and check in at the main information desk.  You need to re-schedule your appointment should you arrive 10 or more minutes late.  We strive to give you quality time with our providers, and arriving late affects you and other patients whose appointments are after yours.  Also, if you no show three or more times for appointments you may be dismissed from the clinic at the providers discretion.     Again, thank you for choosing [facility].  Our hope is that these requests will decrease the amount of time that you wait before being seen by our physicians.       _____________________________________________________________  Should you have questions after your visit to [facility] please contact our office at [phone] and follow the prompts.  Our office hours are 8:00 a.m. and 4:30 p.m.  Monday - Friday.  Please note that voicemails left after 4:00 p.m. may not be returned until the following business day.  We are closed weekends and major holidays.  You do have access to a nurse 24-7, just call the main number to the clinic [phone] and do not press any options, hold on the line and a nurse will answer the phone.    For prescription refill requests, have your pharmacy contact our office and allow 72 hours.    Due to Covid, you will need to wear a mask upon entering the hospital. If you do not have a mask, a mask will be given to you at the Main Entrance upon arrival. For doctor visits, patients may have 1 support person age 41 or older with them. For treatment visits, patients can not have anyone with them due to social distancing guidelines and our immunocompromised population.

## 2023-12-26 NOTE — Progress Notes (Signed)
 [facility] [address], [place] [address]    Clinic Day:  12/26/23   Referring physician: [name], MD  Patient Care Team: Pcp, No as PCP - General [name], RN as Oncology Nurse Navigator (Oncology) [name], MD as Medical Oncologist (Medical Oncology)   ASSESSMENT & PLAN:   Assessment: 1.  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 -Mutations were negative for EGFR, ALK, ROS1, RET, BRAF V600 -PD-L1 22 C3 greater than 95%. -He was started on single agent pembrolizumab every 3 weeks under the direction of Dr. Alain Marion in [place] in August 2020, later switched to every 6 weeks. - CT chest (06/21/2022): Irregular solid 1.4 cm left lower lobe lung nodule, slightly increased suspicious for malignancy.  This has been steadily increasing for the last 1 year.  Stable spiculated solid 3.4 cm right upper lobe lung mass.  Mild left axillary and left retrocrural adenopathy stable. - PET scan (08/03/2022): Left lower lobe lung nodule 1.3 cm solid irregular with SUV 1.8.  Hypermetabolic spiculated 3.3 cm right upper lobe lung nodule, SUV 12.  Hypermetabolic left retrocrural and axillary lymph nodes.  Solitary left level 2 neck lymph node. - Right upper lobe lung mass is stable since March 2022.  Steadily increasing left lower lobe lung nodule has very low FDG uptake on PET scan. - Left axillary lymph node biopsy (08/31/2022): Metastatic poorly differentiated adenocarcinoma. - NGS: PD-L1 TPS 100%, no other targetable mutations.  TMB-low.  MSI-stable.  CDK N2A pathogenic variant. - XRT to the left axillary lymph node from 05/08/2023 through 05/28/2023, 40 Gray   2.  Social/family history: -He is a retired [employer] architect.  Quit smoking in August 2020. -Father had prostate cancer.    Plan: 1.  Stage IV  adenocarcinoma of the lung to the brain, PD-L1 TPS >95%: - PET scan (09/27/2023): Right upper lobe hypermetabolic mass is stable.  There is new inflammation/infection/mass in the left upper lobe. - CT chest (12/17/2023): Right upper lobe lung nodule is stable.  Left lower lobe spiculated nodule has grown by few millimeters.  4 mm area of nodularity in the posterior left upper lobe is new.  Left axillary lymph node has resolved. - He does not report any immunotherapy related side effects. - I reviewed images of the CT scan with the patient.  Recommend continue close monitoring.  If there is any significant increase in the left lower lobe nodule, will consider radiation. - Labs today: Normal LFTs and CBC.  He may proceed with Keytruda today and in 6 weeks.  RTC 12 weeks for follow-up with repeat CT chest with contrast.  2.  Brain metastasis: - MRI brain (03/10/2023): No evidence of intracranial metastatic disease.  Consider repeating MRI in 1 year.   3.  COPD: - He stopped using Breo Ellipta because of cost.  Continue albuterol inhaler.  4.  Hypothyroidism: - He stopped taking Synthroid about 4 weeks ago as he did not see any refills on the bottle.  TSH has increased to 15 from 7.8.  Recommend that he restart back on Synthroid 50 mcg daily.  We have sent prescription again with several refills.    Orders Placed This Encounter  Procedures   CT CHEST W CONTRAST    Standing Status:   Future    Expected Date:   03/27/2024  Expiration Date:   12/25/2024    If indicated for the ordered procedure, I authorize the administration of contrast media per Radiology protocol:   Yes    Does the patient have a contrast media/X-ray dye allergy?:   No    Preferred imaging location?:   [facility]   CBC with Differential    Standing Status:   Future    Expected Date:   03/20/2024    Expiration Date:   03/21/2025   Comprehensive metabolic panel    Standing Status:   Future    Expected Date:   03/20/2024     Expiration Date:   03/21/2025   T4    Standing Status:   Future    Expected Date:   03/20/2024    Expiration Date:   03/21/2025   TSH    Standing Status:   Future    Expected Date:   03/20/2024    Expiration Date:   03/21/2025   CBC with Differential    Standing Status:   Future    Expected Date:   05/01/2024    Expiration Date:   05/02/2025   Comprehensive metabolic panel    Standing Status:   Future    Expected Date:   05/01/2024    Expiration Date:   05/02/2025   T4    Standing Status:   Future    Expected Date:   05/01/2024    Expiration Date:   05/02/2025   TSH    Standing Status:   Future    Expected Date:   05/01/2024    Expiration Date:   05/02/2025      Antonio Payne,acting as a scribe for [name], MD.,have documented all relevant documentation on the behalf of [name], MD,as directed by  [name], MD while in the presence of [name], MD.  I, [name] MD, have reviewed the above documentation for accuracy and completeness, and I agree with the above.      [name], MD   3/26/202511:24 AM  CHIEF COMPLAINT:   Diagnosis: metastatic right lung adenocarcinoma to brain    Cancer Staging  Adenocarcinoma of lung, stage 4, right [facility]) Staging form: Lung, AJCC 8th Edition - Clinical stage from 12/08/2020: Stage IVB (cT2b, cN3, pM1c) - Signed by [name], MD on 12/08/2020    Prior Therapy: none  Current Therapy:  Keytruda    HISTORY OF PRESENT ILLNESS:   Oncology History  Adenocarcinoma of lung, stage 4, right (HCC)  12/08/2020 Initial Diagnosis   Adenocarcinoma of lung, stage 4, right (HCC)   12/08/2020 Cancer Staging   Staging form: Lung, AJCC 8th Edition - Clinical stage from 12/08/2020: Stage IVB (cT2b, cN3, pM1c) - Signed by [name], MD on 12/08/2020 Histopathologic type: Adenocarcinoma, NOS   12/29/2020 - 05/17/2022 Chemotherapy   Patient is on Treatment Plan : LUNG  Pembrolizumab q42d     12/29/2020 -  Chemotherapy   Patient is on Treatment Plan : LUNG Pembrolizumab (400) q42d        INTERVAL HISTORY:   Antonio Payne is a 68 y.o. male presenting to clinic today for follow up of metastatic right lung adenocarcinoma to brain. He was last seen by me on 11/15/23.  Since his last visit, he underwent CT chest on 12/17/23 that found: Interval increase in the size of the spiculated nodule in the left lower lobe. Interval development of a 4 mm area of architectural nodularity in the posterior left upper lobe along the fissure.  Interval decrease in the size of the spiculated dominant nodule in the right upper lobe. Resolution of the previously seen left axillary adenopathy. T8 compression fracture with approximately 50% loss of vertebral body height, new since the prior CT, and concerning for a pathologic fracture.  Today, he states that he is doing well overall. His appetite level is at 100%. His energy level is at 100%.   PAST MEDICAL HISTORY:   Past Medical History: Past Medical History:  Diagnosis Date   Cancer (HCC)    lung cancer     Surgical History: Past Surgical History:  Procedure Laterality Date   BACK SURGERY      Social History: Social History   Socioeconomic History   Marital status: Single    Spouse name: Not on file   Number of children: Not on file   Years of education: Not on file   Highest education level: Not on file  Occupational History   Not on file  Tobacco Use   Smoking status: Former    Current packs/day: 0.00    Types: Cigarettes    Quit date: 12/01/2019    Years since quitting: 4.0   Smokeless tobacco: Never  Substance and Sexual Activity   Alcohol use: Never   Drug use: Never   Sexual activity: Not Currently  Other Topics Concern   Not on file  Social History Narrative   Not on file   Social Drivers of Health   Financial Resource Strain: Low Risk  (12/06/2020)   Overall Financial Resource Strain (CARDIA)     Difficulty of Paying Living Expenses: Not hard at all  Food Insecurity: No Food Insecurity (12/06/2020)   Hunger Vital Sign    Worried About Running Out of Food in the Last Year: Never true    Ran Out of Food in the Last Year: Never true  Transportation Needs: No Transportation Needs (12/06/2020)   PRAPARE - Administrator, Civil Service (Medical): No    Lack of Transportation (Non-Medical): No  Physical Activity: Sufficiently Active (12/06/2020)   Exercise Vital Sign    Days of Exercise per Week: 7 days    Minutes of Exercise per Session: 30 min  Stress: No Stress Concern Present (12/06/2020)   [name] of Occupational Health - Occupational Stress Questionnaire    Feeling of Stress : Only a little  Social Connections: Moderately Integrated (12/06/2020)   Social Connection and Isolation Panel [NHANES]    Frequency of Communication with Friends and Family: More than three times a week    Frequency of Social Gatherings with Friends and Family: More than three times a week    Attends Religious Services: 1 to 4 times per year    Active Member of [employer] or Organizations: No    Attends Banker Meetings: 1 to 4 times per year    Marital Status: Divorced  Catering manager Violence: Not At Risk (12/06/2020)   Humiliation, Afraid, Rape, and Kick questionnaire    Fear of Current or Ex-Partner: No    Emotionally Abused: No    Physically Abused: No    Sexually Abused: No    Family History: No family history on file.  Current Medications:  Current Outpatient Medications:    albuterol (VENTOLIN HFA) 108 (90 Base) MCG/ACT inhaler, Inhale 2 puffs into the lungs every 4 (four) hours as needed for wheezing or shortness of breath., Disp: 18 g, Rfl: 3   levothyroxine (SYNTHROID) 50 MCG tablet, Take 1 tablet (50 mcg  total) by mouth daily before breakfast., Disp: 30 tablet, Rfl: 3 No current facility-administered medications for this visit.  Facility-Administered Medications  Ordered in Other Visits:    0.9 %  sodium chloride infusion, , Intravenous, Once, [name], MD   heparin lock flush 100 unit/mL, 500 Units, Intracatheter, Once PRN, [name], MD   pembrolizumab [facility]) 400 mg in sodium chloride 0.9 % 50 mL chemo infusion, 400 mg, Intravenous, Once, [name], MD   sodium chloride flush (NS) 0.9 % injection 10 mL, 10 mL, Intracatheter, PRN, [name], MD   Allergies: No Known Allergies  REVIEW OF SYSTEMS:   Review of Systems  Constitutional:  Negative for chills, fatigue and fever.  HENT:   Negative for lump/mass, mouth sores, nosebleeds, sore throat and trouble swallowing.   Eyes:  Negative for eye problems.  Respiratory:  Positive for shortness of breath. Negative for cough.   Cardiovascular:  Negative for chest pain, leg swelling and palpitations.  Gastrointestinal:  Negative for abdominal pain, constipation, diarrhea, nausea and vomiting.  Genitourinary:  Negative for bladder incontinence, difficulty urinating, dysuria, frequency, hematuria and nocturia.   Musculoskeletal:  Negative for arthralgias, back pain, flank pain, myalgias and neck pain.  Skin:  Negative for itching and rash.  Neurological:  Negative for dizziness, headaches and numbness.  Hematological:  Does not bruise/bleed easily.  Psychiatric/Behavioral:  Negative for depression, sleep disturbance and suicidal ideas. The patient is not nervous/anxious.   All other systems reviewed and are negative.    VITALS:   Blood pressure (!) 153/85, pulse 84, temperature (!) 97.4 F (36.3 C), temperature source Tympanic, resp. rate 18, weight 146 lb (66.2 kg), SpO2 100%.  Wt Readings from Last 3 Encounters:  12/26/23 146 lb (66.2 kg)  11/15/23 148 lb (67.1 kg)  10/04/23 155 lb 13.8 oz (70.7 kg)    Body mass index is 21.56 kg/m.  Performance status (ECOG): 1 - Symptomatic but completely ambulatory  PHYSICAL EXAM:   Physical Exam Vitals  and nursing note reviewed. Exam conducted with a chaperone present.  Constitutional:      Appearance: Normal appearance.  Cardiovascular:     Rate and Rhythm: Normal rate and regular rhythm.     Pulses: Normal pulses.     Heart sounds: Normal heart sounds.  Pulmonary:     Effort: Pulmonary effort is normal.     Breath sounds: Normal breath sounds.  Abdominal:     Palpations: Abdomen is soft. There is no hepatomegaly, splenomegaly or mass.     Tenderness: There is no abdominal tenderness.  Musculoskeletal:     Right lower leg: No edema.     Left lower leg: No edema.  Lymphadenopathy:     Cervical: No cervical adenopathy.     Right cervical: No superficial, deep or posterior cervical adenopathy.    Left cervical: No superficial, deep or posterior cervical adenopathy.     Upper Body:     Right upper body: No supraclavicular or axillary adenopathy.     Left upper body: No supraclavicular or axillary adenopathy.  Neurological:     General: No focal deficit present.     Mental Status: He is alert and oriented to person, place, and time.  Psychiatric:        Mood and Affect: Mood normal.        Behavior: Behavior normal.     LABS:      Latest Ref Rng & Units 12/26/2023   10:05 AM 11/15/2023    9:34  AM 09/27/2023    8:37 AM  CBC  WBC 4.0 - 10.5 K/uL 6.2  6.4  6.1   Hemoglobin 13.0 - 17.0 g/dL 16.1  09.6  04.5   Hematocrit 39.0 - 52.0 % 42.2  41.3  41.0   Platelets 150 - 400 K/uL 239  231  378       Latest Ref Rng & Units 12/26/2023   10:05 AM 11/15/2023    9:34 AM 09/27/2023    8:37 AM  CMP  Glucose 70 - 99 mg/dL 409  811  914   BUN 8 - 23 mg/dL 14  17  12    Creatinine 0.61 - 1.24 mg/dL 7.82  9.56  2.13   Sodium 135 - 145 mmol/L 133  133  129   Potassium 3.5 - 5.1 mmol/L 4.4  4.5  4.4   Chloride 98 - 111 mmol/L 95  98  93   CO2 22 - 32 mmol/L 29  27  26    Calcium 8.9 - 10.3 mg/dL 9.4  9.1  9.0   Total Protein 6.5 - 8.1 g/dL 7.3  7.5  7.6   Total Bilirubin 0.0 - 1.2  mg/dL 0.4  0.7  0.8   Alkaline Phos 38 - 126 U/L 71  75  71   AST 15 - 41 U/L 20  21  27    ALT 0 - 44 U/L 14  16  14       No results found for: "CEA1", "CEA" / No results found for: "CEA1", "CEA" No results found for: "PSA1" No results found for: "YQM578" No results found for: "CAN125"  No results found for: "TOTALPROTELP", "ALBUMINELP", "A1GS", "A2GS", "BETS", "BETA2SER", "GAMS", "MSPIKE", "SPEI" No results found for: "TIBC", "FERRITIN", "IRONPCTSAT" No results found for: "LDH"   STUDIES:   CT CHEST W CONTRAST Result Date: 12/25/2023 CLINICAL DATA:  Non-small cell lung cancer.  Monitor. EXAM: CT CHEST WITH CONTRAST TECHNIQUE: Multidetector CT imaging of the chest was performed during intravenous contrast administration. RADIATION DOSE REDUCTION: This exam was performed according to the departmental dose-optimization program which includes automated exposure control, adjustment of the mA and/or kV according to patient size and/or use of iterative reconstruction technique. CONTRAST:  80mL OMNIPAQUE IOHEXOL 300 MG/ML  SOLN COMPARISON:  Chest CT dated 01/18/2023. FINDINGS: Cardiovascular: There is no cardiomegaly or pericardial effusion. Three-vessel coronary vascular calcification. Mild atherosclerotic calcification of the thoracic aorta. No aneurysmal dilatation or dissection. The origins of the great vessels of the aortic arch and the central pulmonary arteries appear patent. Mediastinum/Nodes: No hilar or mediastinal adenopathy. The esophagus is grossly unremarkable. No mediastinal fluid collection. Lungs/Pleura: Background of moderate centrilobular emphysema. Spiculated nodule in the right upper lobe measures 2.6 x 2.4 cm (previously approximately 3.1 x 2.4 cm by my measurements). Interval increase in the size of the spiculated nodule in the left lower lobe measuring approximately 1.5 x 1.3 cm (previously 1.5 x 1.1 cm). A 4 mm nodule in the left upper lobe (34/3) is similar to prior CT. Interval  development of an area of architectural nodularity in the posterior left upper lobe along the fissure measuring approximately 4 mm (48/3). Additional 3 mm nodule in the anterior right middle lobe is similar to prior CT. No pleural effusion or pneumothorax. The central airways are patent. Upper Abdomen: No acute abnormality. Musculoskeletal: There is loss of subcutaneous fat and cachexia. Resolution of the previously seen left axillary adenopathy. Ill-defined 1.3 x 0.9 cm hypodense focus in the left subscapularis muscle (26/2) is  similar to prior CT. There is osteopenia with degenerative changes of the spine. There is T8 compression fracture with approximately 50% loss of vertebral body height, new since the prior CT. Sclerotic areas within T8 suspicious for metastatic disease. No retropulsion. IMPRESSION: 1. Interval increase in the size of the spiculated nodule in the left lower lobe. 2. Interval development of a 4 mm area of architectural nodularity in the posterior left upper lobe along the fissure. 3. Interval decrease in the size of the spiculated dominant nodule in the right upper lobe. 4. Resolution of the previously seen left axillary adenopathy. 5. T8 compression fracture with approximately 50% loss of vertebral body height, new since the prior CT, and concerning for a pathologic fracture. Electronically Signed   By: Elgie Collard M.D.   On: 12/25/2023 16:40

## 2023-12-26 NOTE — Patient Instructions (Signed)
 [facility] [place] - A DEPT OF [facility]  Discharge Instructions: Thank you for choosing [facility] to provide your oncology and hematology care.  If you have a lab appointment with the Cancer Center - please note that after April 8th, 2024, all labs will be drawn in the cancer center.  You do not have to check in or register with the main entrance as you have in the past but will complete your check-in in the cancer center.  Wear comfortable clothing and clothing appropriate for easy access to any Portacath or PICC line.   We strive to give you quality time with your provider. You may need to reschedule your appointment if you arrive late (15 or more minutes).  Arriving late affects you and other patients whose appointments are after yours.  Also, if you miss three or more appointments without notifying the office, you may be dismissed from the clinic at the provider's discretion.      For prescription refill requests, have your pharmacy contact our office and allow 72 hours for refills to be completed.    Today you received the following chemotherapy and/or immunotherapy agents Keytruda      To help prevent nausea and vomiting after your treatment, we encourage you to take your nausea medication as directed.  BELOW ARE SYMPTOMS THAT SHOULD BE REPORTED IMMEDIATELY: *FEVER GREATER THAN 100.4 F (38 C) OR HIGHER *CHILLS OR SWEATING *NAUSEA AND VOMITING THAT IS NOT CONTROLLED WITH YOUR NAUSEA MEDICATION *UNUSUAL SHORTNESS OF BREATH *UNUSUAL BRUISING OR BLEEDING *URINARY PROBLEMS (pain or burning when urinating, or frequent urination) *BOWEL PROBLEMS (unusual diarrhea, constipation, pain near the anus) TENDERNESS IN MOUTH AND THROAT WITH OR WITHOUT PRESENCE OF ULCERS (sore throat, sores in mouth, or a toothache) UNUSUAL RASH, SWELLING OR PAIN  UNUSUAL VAGINAL DISCHARGE OR ITCHING   Items with * indicate a potential emergency and should be followed up  as soon as possible or go to the Emergency Department if any problems should occur.  Please show the CHEMOTHERAPY ALERT CARD or IMMUNOTHERAPY ALERT CARD at check-in to the Emergency Department and triage nurse.  Should you have questions after your visit or need to cancel or reschedule your appointment, please contact [facility]  - A DEPT OF [facility] [phone]  and follow the prompts.  Office hours are 8:00 a.m. to 4:30 p.m. Monday - Friday. Please note that voicemails left after 4:00 p.m. may not be returned until the following business day.  We are closed weekends and major holidays. You have access to a nurse at all times for urgent questions. Please call the main number to the clinic [phone] and follow the prompts.  For any non-urgent questions, you may also contact your provider using MyChart. We now offer e-Visits for anyone 30 and older to request care online for non-urgent symptoms. For details visit [email or web address].   Also download the MyChart app! Go to the app store, search "MyChart", open the app, select [place], and log in with your MyChart username and password.

## 2023-12-26 NOTE — Progress Notes (Signed)
 Patient has been examined by Dr. Ellin Saba. Vital signs and labs have been reviewed by MD - ANC, Creatinine, LFTs, hemoglobin, and platelets are within treatment parameters per M.D. - pt may proceed with treatment.  Primary RN and pharmacy notified.

## 2023-12-27 ENCOUNTER — Other Ambulatory Visit: Payer: Self-pay

## 2023-12-27 LAB — T4: T4, Total: 5.8 ug/dL (ref 4.5–12.0)

## 2024-01-05 ENCOUNTER — Other Ambulatory Visit: Payer: Self-pay

## 2024-01-16 ENCOUNTER — Other Ambulatory Visit: Payer: Self-pay

## 2024-01-30 ENCOUNTER — Encounter: Payer: Self-pay | Admitting: Hematology

## 2024-02-05 ENCOUNTER — Other Ambulatory Visit: Payer: Self-pay

## 2024-02-05 DIAGNOSIS — C3491 Malignant neoplasm of unspecified part of right bronchus or lung: Secondary | ICD-10-CM

## 2024-02-06 ENCOUNTER — Inpatient Hospital Stay: Attending: Hematology

## 2024-02-06 ENCOUNTER — Inpatient Hospital Stay

## 2024-02-06 ENCOUNTER — Inpatient Hospital Stay: Admitting: Hematology

## 2024-02-06 ENCOUNTER — Encounter: Payer: Self-pay | Admitting: Hematology

## 2024-02-06 VITALS — BP 137/86 | HR 86 | Temp 97.6°F | Resp 18 | Wt 148.1 lb

## 2024-02-06 DIAGNOSIS — Z5112 Encounter for antineoplastic immunotherapy: Secondary | ICD-10-CM | POA: Insufficient documentation

## 2024-02-06 DIAGNOSIS — Z79620 Long term (current) use of immunosuppressive biologic: Secondary | ICD-10-CM | POA: Diagnosis not present

## 2024-02-06 DIAGNOSIS — C3491 Malignant neoplasm of unspecified part of right bronchus or lung: Secondary | ICD-10-CM

## 2024-02-06 DIAGNOSIS — C3411 Malignant neoplasm of upper lobe, right bronchus or lung: Secondary | ICD-10-CM | POA: Diagnosis present

## 2024-02-06 LAB — CBC WITH DIFFERENTIAL/PLATELET
Abs Immature Granulocytes: 0.02 10*3/uL (ref 0.00–0.07)
Basophils Absolute: 0.1 10*3/uL (ref 0.0–0.1)
Basophils Relative: 1 %
Eosinophils Absolute: 0.5 10*3/uL (ref 0.0–0.5)
Eosinophils Relative: 8 %
HCT: 41.7 % (ref 39.0–52.0)
Hemoglobin: 14 g/dL (ref 13.0–17.0)
Immature Granulocytes: 0 %
Lymphocytes Relative: 19 %
Lymphs Abs: 1.1 10*3/uL (ref 0.7–4.0)
MCH: 31.3 pg (ref 26.0–34.0)
MCHC: 33.6 g/dL (ref 30.0–36.0)
MCV: 93.1 fL (ref 80.0–100.0)
Monocytes Absolute: 0.6 10*3/uL (ref 0.1–1.0)
Monocytes Relative: 11 %
Neutro Abs: 3.4 10*3/uL (ref 1.7–7.7)
Neutrophils Relative %: 61 %
Platelets: 259 10*3/uL (ref 150–400)
RBC: 4.48 MIL/uL (ref 4.22–5.81)
RDW: 12.5 % (ref 11.5–15.5)
WBC: 5.7 10*3/uL (ref 4.0–10.5)
nRBC: 0 % (ref 0.0–0.2)

## 2024-02-06 LAB — TSH: TSH: 5.187 u[IU]/mL — ABNORMAL HIGH (ref 0.350–4.500)

## 2024-02-06 LAB — COMPREHENSIVE METABOLIC PANEL WITH GFR
ALT: 13 U/L (ref 0–44)
AST: 18 U/L (ref 15–41)
Albumin: 3.9 g/dL (ref 3.5–5.0)
Alkaline Phosphatase: 69 U/L (ref 38–126)
Anion gap: 8 (ref 5–15)
BUN: 14 mg/dL (ref 8–23)
CO2: 27 mmol/L (ref 22–32)
Calcium: 9 mg/dL (ref 8.9–10.3)
Chloride: 94 mmol/L — ABNORMAL LOW (ref 98–111)
Creatinine, Ser: 0.86 mg/dL (ref 0.61–1.24)
GFR, Estimated: >60 mL/min (ref >60)
Glucose, Bld: 98 mg/dL (ref 70–99)
Potassium: 4.9 mmol/L (ref 3.5–5.1)
Sodium: 129 mmol/L — ABNORMAL LOW (ref 135–145)
Total Bilirubin: 0.7 mg/dL (ref 0.0–1.2)
Total Protein: 6.9 g/dL (ref 6.5–8.1)

## 2024-02-06 MED ORDER — SODIUM CHLORIDE 0.9 % IV SOLN: Freq: Once | INTRAVENOUS | Status: AC

## 2024-02-06 MED ORDER — ALBUTEROL SULFATE HFA 108 (90 BASE) MCG/ACT IN AERS: 2.0000 | INHALATION_SPRAY | RESPIRATORY_TRACT | 3 refills | Status: DC | PRN

## 2024-02-06 MED ORDER — SODIUM CHLORIDE 0.9 % IV SOLN
400.0000 mg | Freq: Once | INTRAVENOUS | Status: AC
  Administered 2024-02-06: 400 mg via INTRAVENOUS
  Filled 2024-02-06: qty 16

## 2024-02-06 NOTE — Progress Notes (Signed)
Patient tolerated therapy with no complaints voiced.  Side effects with management reviewed with understanding verbalized.  Peripheral IV site clean and dry with no bruising or swelling noted at site.  Good blood return noted before and after administration of therapy.  Band aid applied.  Patient left in satisfactory condition with VSS and no s/s of distress noted.

## 2024-02-06 NOTE — Patient Instructions (Signed)

## 2024-02-07 LAB — T4: T4, Total: 8.3 ug/dL (ref 4.5–12.0)

## 2024-03-12 ENCOUNTER — Ambulatory Visit (HOSPITAL_COMMUNITY)
Admission: RE | Admit: 2024-03-12 | Discharge: 2024-03-12 | Disposition: A | Discharge status: Home / Self Care | Source: Ambulatory Visit | Attending: Hematology | Admitting: Hematology

## 2024-03-12 DIAGNOSIS — C3491 Malignant neoplasm of unspecified part of right bronchus or lung: Secondary | ICD-10-CM | POA: Diagnosis not present

## 2024-03-12 MED ORDER — IOHEXOL 300 MG/ML  SOLN
75.0000 mL | Freq: Once | INTRAMUSCULAR | Status: AC | PRN
  Administered 2024-03-12: 75 mL via INTRAVENOUS

## 2024-03-18 ENCOUNTER — Other Ambulatory Visit: Payer: Self-pay

## 2024-03-18 NOTE — Progress Notes (Signed)
 [facility] [address] [place] [address]    Clinic Day:  03/18/24   Referring physician: Paulett Boros, MD  Patient Care Team: Pcp, No as PCP - General Gerhard Knuckles, RN as Oncology Nurse Navigator (Oncology) Paulett Boros, MD as Medical Oncologist (Medical Oncology)   ASSESSMENT & PLAN:   Assessment: 1.  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 -Mutations were negative for EGFR, ALK, ROS1, RET, BRAF V600 -PD-L1 22 C3 greater than 95%. -He was started on single agent pembrolizumab  every 3 weeks under the direction of Dr. Kevan Peers in [place]  in August 2020, later switched to every 6 weeks. - CT chest (06/21/2022): Irregular solid 1.4 cm left lower lobe lung nodule, slightly increased suspicious for malignancy.  This has been steadily increasing for the last 1 year.  Stable spiculated solid 3.4 cm right upper lobe lung mass.  Mild left axillary and left retrocrural adenopathy stable. - PET scan (08/03/2022): Left lower lobe lung nodule 1.3 cm solid irregular with SUV 1.8.  Hypermetabolic spiculated 3.3 cm right upper lobe lung nodule, SUV 12.  Hypermetabolic left retrocrural and axillary lymph nodes.  Solitary left level 2 neck lymph node. - Right upper lobe lung mass is stable since March 2022.  Steadily increasing left lower lobe lung nodule has very low FDG uptake on PET scan. - Left axillary lymph node biopsy (08/31/2022): Metastatic poorly differentiated adenocarcinoma. - NGS: PD-L1 TPS 100%, no other targetable mutations.  TMB-low.  MSI-stable.  CDK N2A pathogenic variant. - XRT to the left axillary lymph node from 05/08/2023 through 05/28/2023, 40 Gray   2.  Social/family history: -He is a retired Naval architect.  Quit smoking in August 2020. -Father had prostate cancer.    Plan: 1.  Stage IV  adenocarcinoma of the lung to the brain, PD-L1 TPS >95%: - PET scan (09/27/2023): Right upper lobe hypermetabolic mass is stable.  There is new inflammation/infection/mass in the left upper lobe. - CT chest (12/17/2023): Right upper lobe lung nodule is stable.  Left lower lobe spiculated nodule has grown by few millimeters.  4 mm area of nodularity in the posterior left upper lobe is new.  Left axillary lymph node has resolved. - He does report some shortness of breath on exertion since radiation.  Denies any diarrhea or skin rashes. - Labs from 03/19/2024: Normal LFTs.  CBC normal.  Sodium 130 stable. - CT chest (03/12/2024): Stable 2.7 cm right upper lobe lung nodule.  Stable left lower lobe nodule 1.4 cm.  No progressive disease.  No pneumonitis changes. - Continue Keytruda  today and RTC 6 weeks.  2.  Brain metastasis: - Last brain MRI a year ago did not show any evidence of metastatic disease.  We will repeat brain MRI with and without contrast prior to next visit in 6 weeks.   3.  COPD: - Continue albuterol  inhaler as needed.  He stopped using Breo Ellipta  because of high cost.  4.  Hypothyroidism: - He has been consistently taking Synthroid  50 mcg daily since March.  TSH improved from 15 to 5.1 on 02/06/2024.  TSH today has increased to 10.7. - I will increase Synthroid  to 75 mcg daily.  Will repeat TSH in 6 weeks.    No orders of the defined types were placed in this encounter.     I,Helena R Teague,acting as a  scribe for Paulett Boros, MD.,have documented all relevant documentation on the behalf of Paulett Boros, MD,as directed by  Paulett Boros, MD while in the presence of Paulett Boros, MD.  I, Paulett Boros MD, have reviewed the above documentation for accuracy and completeness, and I agree with the above.      Antonio Payne   6/17/202511:52 AM  CHIEF COMPLAINT:   Diagnosis: metastatic right lung adenocarcinoma to brain    Cancer Staging   Adenocarcinoma of lung, stage 4, right [facility]) Staging form: Lung, AJCC 8th Edition - Clinical stage from 12/08/2020: Stage IVB (cT2b, cN3, pM1c) - Signed by Paulett Boros, MD on 12/08/2020    Prior Therapy: none  Current Therapy:  Keytruda     HISTORY OF PRESENT ILLNESS:   Oncology History  Adenocarcinoma of lung, stage 4, right (HCC)  12/08/2020 Initial Diagnosis   Adenocarcinoma of lung, stage 4, right (HCC)   12/08/2020 Cancer Staging   Staging form: Lung, AJCC 8th Edition - Clinical stage from 12/08/2020: Stage IVB (cT2b, cN3, pM1c) - Signed by Paulett Boros, MD on 12/08/2020 Histopathologic type: Adenocarcinoma, NOS   12/29/2020 - 05/17/2022 Chemotherapy   Patient is on Treatment Plan : LUNG Pembrolizumab  q42d     12/29/2020 -  Chemotherapy   Patient is on Treatment Plan : LUNG Pembrolizumab  (400) q42d        INTERVAL HISTORY:   Antonio Payne is a 68 y.o. male presenting to clinic today for follow up of metastatic right lung adenocarcinoma to brain. He was last seen by me on 12/26/23.  Since his last visit, he underwent CT chest on 03/12/24 that found: Stable 2.7 cm right upper lobe nodule, corresponding to the patient's known primary bronchogenic carcinoma. Stable left lower lobe nodule. No progressive disease.  Today, he states that he is doing well overall. His appetite level is at 100%. His energy level is at 100%.   PAST MEDICAL HISTORY:   Past Medical History: Past Medical History:  Diagnosis Date   Cancer (HCC)    lung cancer     Surgical History: Past Surgical History:  Procedure Laterality Date   BACK SURGERY      Social History: Social History   Socioeconomic History   Marital status: Single    Spouse name: Not on file   Number of children: Not on file   Years of education: Not on file   Highest education level: Not on file  Occupational History   Not on file  Tobacco Use   Smoking status: Former    Current packs/day: 0.00    Types: Cigarettes     Quit date: 12/01/2019    Years since quitting: 4.2   Smokeless tobacco: Never  Substance and Sexual Activity   Alcohol use: Never   Drug use: Never   Sexual activity: Not Currently  Other Topics Concern   Not on file  Social History Narrative   Not on file   Social Drivers of Health   Financial Resource Strain: Low Risk  (12/06/2020)   Overall Financial Resource Strain (CARDIA)    Difficulty of Paying Living Expenses: Not hard at all  Food Insecurity: No Food Insecurity (12/06/2020)   Hunger Vital Sign    Worried About Running Out of Food in the Last Year: Never true    Ran Out of Food in the Last Year: Never true  Transportation Needs: No Transportation Needs (12/06/2020)   PRAPARE - Transportation    Lack of Transportation (Medical): No    Lack of  Transportation (Non-Medical): No  Physical Activity: Sufficiently Active (12/06/2020)   Exercise Vital Sign    Days of Exercise per Week: 7 days    Minutes of Exercise per Session: 30 min  Stress: No Stress Concern Present (12/06/2020)   [name] of Occupational Health - Occupational Stress Questionnaire    Feeling of Stress : Only a little  Social Connections: Moderately Integrated (12/06/2020)   Social Connection and Isolation Panel    Frequency of Communication with Friends and Family: More than three times a week    Frequency of Social Gatherings with Friends and Family: More than three times a week    Attends Religious Services: 1 to 4 times per year    Active Member of [employer] or Organizations: No    Attends Banker Meetings: 1 to 4 times per year    Marital Status: Divorced  Catering manager Violence: Not At Risk (12/06/2020)   Humiliation, Afraid, Rape, and Kick questionnaire    Fear of Current or Ex-Partner: No    Emotionally Abused: No    Physically Abused: No    Sexually Abused: No    Family History: No family history on file.  Current Medications:  Current Outpatient Medications:    albuterol   (VENTOLIN  HFA) 108 (90 Base) MCG/ACT inhaler, Inhale 2 puffs into the lungs every 4 (four) hours as needed for wheezing or shortness of breath., Disp: 18 g, Rfl: 3   levothyroxine  (SYNTHROID ) 50 MCG tablet, Take 1 tablet (50 mcg total) by mouth daily before breakfast., Disp: 30 tablet, Rfl: 3   Allergies: No Known Allergies  REVIEW OF SYSTEMS:   Review of Systems  Constitutional:  Negative for chills, fatigue and fever.  HENT:   Negative for lump/mass, mouth sores, nosebleeds, sore throat and trouble swallowing.   Eyes:  Negative for eye problems.  Respiratory:  Positive for shortness of breath. Negative for cough.   Cardiovascular:  Negative for chest pain, leg swelling and palpitations.  Gastrointestinal:  Negative for abdominal pain, constipation, diarrhea, nausea and vomiting.  Genitourinary:  Negative for bladder incontinence, difficulty urinating, dysuria, frequency, hematuria and nocturia.   Musculoskeletal:  Negative for arthralgias, back pain, flank pain, myalgias and neck pain.  Skin:  Negative for itching and rash.  Neurological:  Negative for dizziness, headaches and numbness.  Hematological:  Does not bruise/bleed easily.  Psychiatric/Behavioral:  Negative for depression, sleep disturbance and suicidal ideas. The patient is not nervous/anxious.   All other systems reviewed and are negative.    VITALS:   There were no vitals taken for this visit.  Wt Readings from Last 3 Encounters:  02/06/24 148 lb 2.4 oz (67.2 kg)  12/26/23 146 lb (66.2 kg)  11/15/23 148 lb (67.1 kg)    There is no height or weight on file to calculate BMI.  Performance status (ECOG): 1 - Symptomatic but completely ambulatory  PHYSICAL EXAM:   Physical Exam Vitals and nursing note reviewed. Exam conducted with a chaperone present.  Constitutional:      Appearance: Normal appearance.   Cardiovascular:     Rate and Rhythm: Normal rate and regular rhythm.     Pulses: Normal pulses.     Heart  sounds: Normal heart sounds.  Pulmonary:     Effort: Pulmonary effort is normal.     Breath sounds: Normal breath sounds.  Abdominal:     Palpations: Abdomen is soft. There is no hepatomegaly, splenomegaly or mass.     Tenderness: There is no abdominal tenderness.  Musculoskeletal:     Right lower leg: No edema.     Left lower leg: No edema.  Lymphadenopathy:     Cervical: No cervical adenopathy.     Right cervical: No superficial, deep or posterior cervical adenopathy.    Left cervical: No superficial, deep or posterior cervical adenopathy.     Upper Body:     Right upper body: No supraclavicular or axillary adenopathy.     Left upper body: No supraclavicular or axillary adenopathy.   Neurological:     General: No focal deficit present.     Mental Status: He is alert and oriented to person, place, and time.   Psychiatric:        Mood and Affect: Mood normal.        Behavior: Behavior normal.     LABS:      Latest Ref Rng & Units 02/06/2024   12:11 PM 12/26/2023   10:05 AM 11/15/2023    9:34 AM  CBC  WBC 4.0 - 10.5 K/uL 5.7  6.2  6.4   Hemoglobin 13.0 - 17.0 g/dL 13.0  86.5  78.4   Hematocrit 39.0 - 52.0 % 41.7  42.2  41.3   Platelets 150 - 400 K/uL 259  239  231       Latest Ref Rng & Units 02/06/2024   12:11 PM 12/26/2023   10:05 AM 11/15/2023    9:34 AM  CMP  Glucose 70 - 99 mg/dL 98  696  295   BUN 8 - 23 mg/dL 14  14  17    Creatinine 0.61 - 1.24 mg/dL 2.84  1.32  4.40   Sodium 135 - 145 mmol/L 129  133  133   Potassium 3.5 - 5.1 mmol/L 4.9  4.4  4.5   Chloride 98 - 111 mmol/L 94  95  98   CO2 22 - 32 mmol/L 27  29  27    Calcium 8.9 - 10.3 mg/dL 9.0  9.4  9.1   Total Protein 6.5 - 8.1 g/dL 6.9  7.3  7.5   Total Bilirubin 0.0 - 1.2 mg/dL 0.7  0.4  0.7   Alkaline Phos 38 - 126 U/L 69  71  75   AST 15 - 41 U/L 18  20  21    ALT 0 - 44 U/L 13  14  16       No results found for: CEA1, CEA / No results found for: CEA1, CEA No results found for: PSA1 No  results found for: NUU725 No results found for: DGU440  No results found for: TOTALPROTELP, ALBUMINELP, A1GS, A2GS, BETS, BETA2SER, GAMS, MSPIKE, SPEI No results found for: TIBC, FERRITIN, IRONPCTSAT No results found for: LDH   STUDIES:   CT CHEST W CONTRAST Result Date: 03/16/2024 EXAM: CT CHEST WITH CONTRAST 03/12/2024 11:48:11 AM TECHNIQUE: CT of the chest was performed with the administration of intravenous contrast. Multiplanar reformatted images are provided for review. Automated exposure control, iterative reconstruction, and/or weight based adjustment of the mA/kV was utilized to reduce the radiation dose to as low as reasonably achievable. COMPARISON: 12/17/2023 CLINICAL HISTORY: Non-small cell lung cancer (NSCLC), monitor. Non-small cell lung cancer (NSCLC), monitor, Adenocarcinoma of lung, stage 4, right (HCC) FINDINGS: MEDIASTINUM: Heart and pericardium are unremarkable. The central airways are clear. LYMPH NODES: No mediastinal, hilar or axillary lymphadenopathy. LUNGS AND PLEURA: Spiculated 2.7 x 2.3 cm right upper lobe nodule (image 42), corresponding to the patient's known primary bronchogenic carcinoma, grossly unchanged. 14 mm posterior left  lower lobe nodule (image 65), grossly unchanged. 4 mm triangular subpleural nodule in the left upper lobe (image 27), unchanged, benign. Moderate centrilobular and paraseptal emphysema, upper lung predominant. No focal consolidation or pulmonary edema. No pleural effusion or pneumothorax. SOFT TISSUES/BONES: Mild anterior compression fracture deformity at T8, chronic. No acute abnormality of the soft tissues. UPPER ABDOMEN: Limited images of the upper abdomen demonstrates no acute abnormality. IMPRESSION: 1. Stable 2.7 cm right upper lobe nodule, corresponding to the patient's known primary bronchogenic carcinoma. 2. Stable left lower lobe nodule. 3. No progressive disease. Electronically signed by: Zadie Herter MD  03/16/2024 10:35 PM EDT RP Workstation: [id number]

## 2024-03-19 ENCOUNTER — Inpatient Hospital Stay (HOSPITAL_BASED_OUTPATIENT_CLINIC_OR_DEPARTMENT_OTHER): Admitting: Hematology

## 2024-03-19 ENCOUNTER — Encounter: Payer: Self-pay | Admitting: Hematology

## 2024-03-19 ENCOUNTER — Inpatient Hospital Stay

## 2024-03-19 ENCOUNTER — Inpatient Hospital Stay: Attending: Hematology

## 2024-03-19 VITALS — BP 147/79 | HR 82 | Temp 97.5°F | Resp 18

## 2024-03-19 VITALS — BP 153/86 | HR 80 | Temp 97.5°F | Resp 18 | Wt 146.6 lb

## 2024-03-19 DIAGNOSIS — C3411 Malignant neoplasm of upper lobe, right bronchus or lung: Secondary | ICD-10-CM | POA: Insufficient documentation

## 2024-03-19 DIAGNOSIS — C3491 Malignant neoplasm of unspecified part of right bronchus or lung: Secondary | ICD-10-CM

## 2024-03-19 DIAGNOSIS — Z79890 Hormone replacement therapy (postmenopausal): Secondary | ICD-10-CM | POA: Insufficient documentation

## 2024-03-19 DIAGNOSIS — C7931 Secondary malignant neoplasm of brain: Secondary | ICD-10-CM | POA: Insufficient documentation

## 2024-03-19 DIAGNOSIS — Z5112 Encounter for antineoplastic immunotherapy: Secondary | ICD-10-CM | POA: Diagnosis present

## 2024-03-19 DIAGNOSIS — J449 Chronic obstructive pulmonary disease, unspecified: Secondary | ICD-10-CM | POA: Insufficient documentation

## 2024-03-19 DIAGNOSIS — E039 Hypothyroidism, unspecified: Secondary | ICD-10-CM | POA: Insufficient documentation

## 2024-03-19 LAB — COMPREHENSIVE METABOLIC PANEL WITH GFR
ALT: 14 U/L (ref 0–44)
AST: 18 U/L (ref 15–41)
Albumin: 3.9 g/dL (ref 3.5–5.0)
Alkaline Phosphatase: 66 U/L (ref 38–126)
Anion gap: 7 (ref 5–15)
BUN: 12 mg/dL (ref 8–23)
CO2: 27 mmol/L (ref 22–32)
Calcium: 9 mg/dL (ref 8.9–10.3)
Chloride: 96 mmol/L — ABNORMAL LOW (ref 98–111)
Creatinine, Ser: 0.79 mg/dL (ref 0.61–1.24)
GFR, Estimated: >60 mL/min (ref >60)
Glucose, Bld: 94 mg/dL (ref 70–99)
Potassium: 4.5 mmol/L (ref 3.5–5.1)
Sodium: 130 mmol/L — ABNORMAL LOW (ref 135–145)
Total Bilirubin: 0.6 mg/dL (ref 0.0–1.2)
Total Protein: 6.9 g/dL (ref 6.5–8.1)

## 2024-03-19 LAB — CBC WITH DIFFERENTIAL/PLATELET
Abs Immature Granulocytes: 0.02 10*3/uL (ref 0.00–0.07)
Basophils Absolute: 0 10*3/uL (ref 0.0–0.1)
Basophils Relative: 1 %
Eosinophils Absolute: 0.4 10*3/uL (ref 0.0–0.5)
Eosinophils Relative: 9 %
HCT: 39.5 % (ref 39.0–52.0)
Hemoglobin: 13.2 g/dL (ref 13.0–17.0)
Immature Granulocytes: 0 %
Lymphocytes Relative: 19 %
Lymphs Abs: 0.9 10*3/uL (ref 0.7–4.0)
MCH: 31.6 pg (ref 26.0–34.0)
MCHC: 33.4 g/dL (ref 30.0–36.0)
MCV: 94.5 fL (ref 80.0–100.0)
Monocytes Absolute: 0.5 10*3/uL (ref 0.1–1.0)
Monocytes Relative: 12 %
Neutro Abs: 2.7 10*3/uL (ref 1.7–7.7)
Neutrophils Relative %: 59 %
Platelets: 241 10*3/uL (ref 150–400)
RBC: 4.18 MIL/uL — ABNORMAL LOW (ref 4.22–5.81)
RDW: 13.6 % (ref 11.5–15.5)
WBC: 4.5 10*3/uL (ref 4.0–10.5)
nRBC: 0 % (ref 0.0–0.2)

## 2024-03-19 LAB — TSH: TSH: 10.736 u[IU]/mL — ABNORMAL HIGH (ref 0.350–4.500)

## 2024-03-19 MED ORDER — SODIUM CHLORIDE 0.9 % IV SOLN: Freq: Once | INTRAVENOUS | Status: AC

## 2024-03-19 MED ORDER — SODIUM CHLORIDE 0.9 % IV SOLN
400.0000 mg | Freq: Once | INTRAVENOUS | Status: AC
  Administered 2024-03-19: 400 mg via INTRAVENOUS
  Filled 2024-03-19: qty 16

## 2024-03-19 MED ORDER — HEPARIN SOD (PORK) LOCK FLUSH 100 UNIT/ML IV SOLN
500.0000 [IU] | Freq: Once | INTRAVENOUS | Status: DC | PRN
Start: 2024-03-19 — End: 2024-03-19

## 2024-03-19 MED ORDER — LEVOTHYROXINE SODIUM 75 MCG PO TABS: 75.0000 ug | ORAL_TABLET | Freq: Every day | ORAL | 3 refills | Status: DC

## 2024-03-19 MED ORDER — SODIUM CHLORIDE 0.9% FLUSH: 10.0000 mL | INTRAVENOUS | Status: DC | PRN

## 2024-03-19 NOTE — Patient Instructions (Signed)
[facility] at [facility] Discharge Instructions   You were seen and examined today by Dr. Katragadda.  He reviewed the results of your lab work which are normal/stable.   He reviewed the results of your CT scan which is stable.   We will proceed with your treatment today.   Return as scheduled.    Thank you for choosing [facility] at [facility] to provide your oncology and hematology care.  To afford each patient quality time with our provider, please arrive at least 15 minutes before your scheduled appointment time.   If you have a lab appointment with the Cancer Center please come in thru the Main Entrance and check in at the main information desk.  You need to re-schedule your appointment should you arrive 10 or more minutes late.  We strive to give you quality time with our providers, and arriving late affects you and other patients whose appointments are after yours.  Also, if you no show three or more times for appointments you may be dismissed from the clinic at the providers discretion.     Again, thank you for choosing [facility].  Our hope is that these requests will decrease the amount of time that you wait before being seen by our physicians.       _____________________________________________________________  Should you have questions after your visit to [facility], please contact our office at [phone] and follow the prompts.  Our office hours are 8:00 a.m. and 4:30 p.m. Monday - Friday.  Please note that voicemails left after 4:00 p.m. may not be returned until the following business day.  We are closed weekends and major holidays.  You do have access to a nurse 24-7, just call the main number to the clinic [phone] and do not press any options, hold on the line and a nurse will answer the phone.    For prescription refill requests, have your pharmacy contact our office and allow 72 hours.     Due to Covid, you will need to wear a mask upon entering the hospital. If you do not have a mask, a mask will be given to you at the Main Entrance upon arrival. For doctor visits, patients may have 1 support person age 18 or older with them. For treatment visits, patients can not have anyone with them due to social distancing guidelines and our immunocompromised population.      

## 2024-03-19 NOTE — Patient Instructions (Signed)
 [facility] [place] - A DEPT OF [facility]  Discharge Instructions: Thank you for choosing [facility] to provide your oncology and hematology care.  If you have a lab appointment with the Cancer Center - please note that after April 8th, 2024, all labs will be drawn in the cancer center.  You do not have to check in or register with the main entrance as you have in the past but will complete your check-in in the cancer center.  Wear comfortable clothing and clothing appropriate for easy access to any Portacath or PICC line.   We strive to give you quality time with your provider. You may need to reschedule your appointment if you arrive late (15 or more minutes).  Arriving late affects you and other patients whose appointments are after yours.  Also, if you miss three or more appointments without notifying the office, you may be dismissed from the clinic at the provider's discretion.      For prescription refill requests, have your pharmacy contact our office and allow 72 hours for refills to be completed.    Today you received the following chemotherapy and/or immunotherapy agents Keytruda      To help prevent nausea and vomiting after your treatment, we encourage you to take your nausea medication as directed.  BELOW ARE SYMPTOMS THAT SHOULD BE REPORTED IMMEDIATELY: *FEVER GREATER THAN 100.4 F (38 C) OR HIGHER *CHILLS OR SWEATING *NAUSEA AND VOMITING THAT IS NOT CONTROLLED WITH YOUR NAUSEA MEDICATION *UNUSUAL SHORTNESS OF BREATH *UNUSUAL BRUISING OR BLEEDING *URINARY PROBLEMS (pain or burning when urinating, or frequent urination) *BOWEL PROBLEMS (unusual diarrhea, constipation, pain near the anus) TENDERNESS IN MOUTH AND THROAT WITH OR WITHOUT PRESENCE OF ULCERS (sore throat, sores in mouth, or a toothache) UNUSUAL RASH, SWELLING OR PAIN  UNUSUAL VAGINAL DISCHARGE OR ITCHING   Items with * indicate a potential emergency and should be followed up  as soon as possible or go to the Emergency Department if any problems should occur.  Please show the CHEMOTHERAPY ALERT CARD or IMMUNOTHERAPY ALERT CARD at check-in to the Emergency Department and triage nurse.  Should you have questions after your visit or need to cancel or reschedule your appointment, please contact [facility]  - A DEPT OF [facility] [phone]  and follow the prompts.  Office hours are 8:00 a.m. to 4:30 p.m. Monday - Friday. Please note that voicemails left after 4:00 p.m. may not be returned until the following business day.  We are closed weekends and major holidays. You have access to a nurse at all times for urgent questions. Please call the main number to the clinic [phone] and follow the prompts.  For any non-urgent questions, you may also contact your provider using MyChart. We now offer e-Visits for anyone 30 and older to request care online for non-urgent symptoms. For details visit [email or web address].   Also download the MyChart app! Go to the app store, search "MyChart", open the app, select [place], and log in with your MyChart username and password.

## 2024-03-19 NOTE — Progress Notes (Signed)
 Patient presents today Keytruda  infusion per providers order.  Vital signs and labs reviewed by MD.  Message received from Donzell Gallery RN/Dr. Cheree Cords patient okay for Keytruda .  Peripheral IV started and blood return noted pre and post infusion.    Treatment given today per MD orders.  Stable during infusion without adverse affects.  Vital signs stable.  No complaints at this time.  Discharge from clinic ambulatory in stable condition.  Alert and oriented X 3.  Follow up with [facility] as scheduled.

## 2024-03-20 LAB — T4: T4, Total: 6.6 ug/dL (ref 4.5–12.0)

## 2024-03-29 ENCOUNTER — Other Ambulatory Visit: Payer: Self-pay | Admitting: Hematology

## 2024-03-31 ENCOUNTER — Encounter: Payer: Self-pay | Admitting: Hematology

## 2024-04-09 ENCOUNTER — Other Ambulatory Visit: Payer: Self-pay | Admitting: Hematology

## 2024-04-09 DIAGNOSIS — C3491 Malignant neoplasm of unspecified part of right bronchus or lung: Secondary | ICD-10-CM

## 2024-04-21 ENCOUNTER — Ambulatory Visit (HOSPITAL_COMMUNITY)
Admission: RE | Admit: 2024-04-21 | Discharge: 2024-04-21 | Disposition: A | Discharge status: Home / Self Care | Source: Ambulatory Visit | Attending: Hematology | Admitting: Hematology

## 2024-04-21 DIAGNOSIS — C3491 Malignant neoplasm of unspecified part of right bronchus or lung: Secondary | ICD-10-CM | POA: Insufficient documentation

## 2024-04-21 MED ORDER — GADOBUTROL 1 MMOL/ML IV SOLN
7.0000 mL | Freq: Once | INTRAVENOUS | Status: AC | PRN
  Administered 2024-04-21: 7 mL via INTRAVENOUS

## 2024-04-30 ENCOUNTER — Inpatient Hospital Stay: Attending: Hematology

## 2024-04-30 ENCOUNTER — Encounter: Payer: Self-pay | Admitting: Hematology

## 2024-04-30 ENCOUNTER — Inpatient Hospital Stay

## 2024-04-30 ENCOUNTER — Inpatient Hospital Stay (HOSPITAL_BASED_OUTPATIENT_CLINIC_OR_DEPARTMENT_OTHER): Admitting: Hematology

## 2024-04-30 VITALS — BP 135/73 | HR 84 | Temp 98.0°F | Resp 20

## 2024-04-30 VITALS — BP 132/82 | HR 83 | Temp 97.4°F | Resp 16 | Wt 145.3 lb

## 2024-04-30 DIAGNOSIS — J449 Chronic obstructive pulmonary disease, unspecified: Secondary | ICD-10-CM | POA: Insufficient documentation

## 2024-04-30 DIAGNOSIS — C3491 Malignant neoplasm of unspecified part of right bronchus or lung: Secondary | ICD-10-CM

## 2024-04-30 DIAGNOSIS — C3411 Malignant neoplasm of upper lobe, right bronchus or lung: Secondary | ICD-10-CM | POA: Insufficient documentation

## 2024-04-30 DIAGNOSIS — C7931 Secondary malignant neoplasm of brain: Secondary | ICD-10-CM | POA: Insufficient documentation

## 2024-04-30 DIAGNOSIS — Z5112 Encounter for antineoplastic immunotherapy: Secondary | ICD-10-CM | POA: Insufficient documentation

## 2024-04-30 DIAGNOSIS — E039 Hypothyroidism, unspecified: Secondary | ICD-10-CM | POA: Insufficient documentation

## 2024-04-30 LAB — CBC WITH DIFFERENTIAL/PLATELET
Abs Immature Granulocytes: 0.02 K/uL (ref 0.00–0.07)
Basophils Absolute: 0.1 K/uL (ref 0.0–0.1)
Basophils Relative: 1 %
Eosinophils Absolute: 0.4 K/uL (ref 0.0–0.5)
Eosinophils Relative: 8 %
HCT: 43.1 % (ref 39.0–52.0)
Hemoglobin: 14.6 g/dL (ref 13.0–17.0)
Immature Granulocytes: 0 %
Lymphocytes Relative: 18 %
Lymphs Abs: 1 K/uL (ref 0.7–4.0)
MCH: 32.5 pg (ref 26.0–34.0)
MCHC: 33.9 g/dL (ref 30.0–36.0)
MCV: 96 fL (ref 80.0–100.0)
Monocytes Absolute: 0.7 K/uL (ref 0.1–1.0)
Monocytes Relative: 11 %
Neutro Abs: 3.5 K/uL (ref 1.7–7.7)
Neutrophils Relative %: 62 %
Platelets: 261 K/uL (ref 150–400)
RBC: 4.49 MIL/uL (ref 4.22–5.81)
RDW: 13.2 % (ref 11.5–15.5)
WBC: 5.7 K/uL (ref 4.0–10.5)
nRBC: 0 % (ref 0.0–0.2)

## 2024-04-30 LAB — COMPREHENSIVE METABOLIC PANEL WITH GFR
ALT: 13 U/L (ref 0–44)
AST: 20 U/L (ref 15–41)
Albumin: 4.2 g/dL (ref 3.5–5.0)
Alkaline Phosphatase: 78 U/L (ref 38–126)
Anion gap: 12 (ref 5–15)
BUN: 15 mg/dL (ref 8–23)
CO2: 26 mmol/L (ref 22–32)
Calcium: 9.1 mg/dL (ref 8.9–10.3)
Chloride: 95 mmol/L — ABNORMAL LOW (ref 98–111)
Creatinine, Ser: 0.93 mg/dL (ref 0.61–1.24)
GFR, Estimated: >60 mL/min (ref >60)
Glucose, Bld: 64 mg/dL — ABNORMAL LOW (ref 70–99)
Potassium: 4.1 mmol/L (ref 3.5–5.1)
Sodium: 133 mmol/L — ABNORMAL LOW (ref 135–145)
Total Bilirubin: 0.8 mg/dL (ref 0.0–1.2)
Total Protein: 7.2 g/dL (ref 6.5–8.1)

## 2024-04-30 LAB — TSH: TSH: 7.301 u[IU]/mL — ABNORMAL HIGH (ref 0.350–4.500)

## 2024-04-30 MED ORDER — SODIUM CHLORIDE 0.9 % IV SOLN
400.0000 mg | Freq: Once | INTRAVENOUS | Status: AC
  Administered 2024-04-30: 400 mg via INTRAVENOUS
  Filled 2024-04-30: qty 16

## 2024-04-30 MED ORDER — SODIUM CHLORIDE 0.9 % IV SOLN
Freq: Once | INTRAVENOUS | Status: AC
Start: 2024-04-30 — End: 2024-04-30

## 2024-04-30 NOTE — Progress Notes (Signed)
 [facility] [address], [place] [address]    Clinic Day:  04/30/24   Referring physician: Rogers Hai, MD  Patient Care Team: Pcp, No as PCP - General Celestia Joesph SQUIBB, RN as Oncology Nurse Navigator (Oncology) Rogers Hai, MD as Medical Oncologist (Medical Oncology)   ASSESSMENT & PLAN:   Assessment: 1.  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 -Mutations were negative for EGFR, ALK, ROS1, RET, BRAF V600 -PD-L1 22 C3 greater than 95%. -He was started on single agent pembrolizumab  every 3 weeks under the direction of Dr. Lamar Salina in [place]  in August 2020, later switched to every 6 weeks. - CT chest (06/21/2022): Irregular solid 1.4 cm left lower lobe lung nodule, slightly increased suspicious for malignancy.  This has been steadily increasing for the last 1 year.  Stable spiculated solid 3.4 cm right upper lobe lung mass.  Mild left axillary and left retrocrural adenopathy stable. - PET scan (08/03/2022): Left lower lobe lung nodule 1.3 cm solid irregular with SUV 1.8.  Hypermetabolic spiculated 3.3 cm right upper lobe lung nodule, SUV 12.  Hypermetabolic left retrocrural and axillary lymph nodes.  Solitary left level 2 neck lymph node. - Right upper lobe lung mass is stable since March 2022.  Steadily increasing left lower lobe lung nodule has very low FDG uptake on PET scan. - Left axillary lymph node biopsy (08/31/2022): Metastatic poorly differentiated adenocarcinoma. - NGS: PD-L1 TPS 100%, no other targetable mutations.  TMB-low.  MSI-stable.  CDK N2A pathogenic variant. - XRT to the left axillary lymph node from 05/08/2023 through 05/28/2023, 40 Gray   2.  Social/family history: -He is a retired Naval architect.  Quit smoking in August 2020. -Father had prostate cancer.    Plan: 1.  Stage IV  adenocarcinoma of the lung to the brain, PD-L1 TPS >95%: - PET scan (09/27/2023): Right upper lobe hypermetabolic mass is stable.  There is new inflammation/infection/mass in the left upper lobe. - CT chest (12/17/2023): Right upper lobe lung nodule is stable.  Left lower lobe spiculated nodule has grown by few millimeters.  4 mm area of nodularity in the posterior left upper lobe is new.  Left axillary lymph node has resolved. - He is tolerating immunotherapy very well.  Denies any worsening shortness of breath or diarrhea. - Labs from 04/30/2024: Normal LFTs and creatinine. - Recommend continuing pembrolizumab  every 6 weeks.  RTC 18 weeks for follow-up with repeat CT CAP with contrast.  2.  Brain metastasis: - I have reviewed the brain MRI from 04/21/2024: No evidence of metastatic disease.   3.  COPD: - Continue albuterol  inhaler as needed.  He stopped using Breo Ellipta  because of high cost.  4.  Hypothyroidism: - At last visit, we have increased Synthroid  to 75 mcg daily.  TSH has improved from 10.7-7.3.  Will continue the same dose at this time and repeat TSH level in 6 weeks.    Orders Placed This Encounter  Procedures   CT CHEST ABDOMEN PELVIS W CONTRAST    Standing Status:   Future    Expected Date:   09/23/2024    Expiration Date:   04/30/2025    If indicated for the ordered procedure, I authorize the administration of contrast media per Radiology protocol:   Yes    Does the patient have a contrast media/X-ray dye allergy?:  No    Preferred imaging location?:   [facility]    If indicated for the ordered procedure, I authorize the administration of oral contrast media per Radiology protocol:   Yes   CBC with Differential    Standing Status:   Future    Expected Date:   06/11/2024    Expiration Date:   06/12/2025   Comprehensive metabolic panel    Standing Status:   Future    Expected Date:   06/11/2024    Expiration Date:   06/12/2025   T4    Standing Status:   Future     Expected Date:   06/11/2024    Expiration Date:   06/12/2025   TSH    Standing Status:   Future    Expected Date:   06/11/2024    Expiration Date:   06/12/2025   CBC with Differential    Standing Status:   Future    Expected Date:   07/23/2024    Expiration Date:   07/24/2025   Comprehensive metabolic panel    Standing Status:   Future    Expected Date:   07/23/2024    Expiration Date:   07/24/2025   T4    Standing Status:   Future    Expected Date:   07/23/2024    Expiration Date:   07/24/2025   TSH    Standing Status:   Future    Expected Date:   07/23/2024    Expiration Date:   07/24/2025      LILLETTE Hummingbird R Teague,acting as a scribe for Alean Stands, MD.,have documented all relevant documentation on the behalf of Alean Stands, MD,as directed by  Alean Stands, MD while in the presence of Alean Stands, MD.  I, Alean Stands MD, have reviewed the above documentation for accuracy and completeness, and I agree with the above.      Alean Stands, MD   7/30/20254:57 PM  CHIEF COMPLAINT:   Diagnosis: metastatic right lung adenocarcinoma to brain    Cancer Staging  Adenocarcinoma of lung, stage 4, right [facility]) Staging form: Lung, AJCC 8th Edition - Clinical stage from 12/08/2020: Stage IVB (cT2b, cN3, pM1c) - Signed by Stands Alean, MD on 12/08/2020    Prior Therapy: none  Current Therapy:  Keytruda     HISTORY OF PRESENT ILLNESS:   Oncology History  Adenocarcinoma of lung, stage 4, right (HCC)  12/08/2020 Initial Diagnosis   Adenocarcinoma of lung, stage 4, right (HCC)   12/08/2020 Cancer Staging   Staging form: Lung, AJCC 8th Edition - Clinical stage from 12/08/2020: Stage IVB (cT2b, cN3, pM1c) - Signed by Stands Alean, MD on 12/08/2020 Histopathologic type: Adenocarcinoma, NOS   12/29/2020 - 05/17/2022 Chemotherapy   Patient is on Treatment Plan : LUNG Pembrolizumab  q42d     12/29/2020 -  Chemotherapy   Patient is on  Treatment Plan : LUNG Pembrolizumab  (400) q42d        INTERVAL HISTORY:   Awesome is a 68 y.o. male presenting to clinic today for follow up of metastatic right lung adenocarcinoma to brain. He was last seen by me on 03/19/2024.  Since his last visit, he underwent brain MRI on 04/21/2024 that found: No evidence of metastatic disease to the brain. Mild periventricular white matter disease.  Today, he states that he is doing well overall. His appetite level is at 100%. His energy level is at 100%.   PAST MEDICAL HISTORY:   Past Medical History: Past Medical History:  Diagnosis Date  Cancer [facility])    lung cancer     Surgical History: Past Surgical History:  Procedure Laterality Date   BACK SURGERY      Social History: Social History   Socioeconomic History   Marital status: Single    Spouse name: Not on file   Number of children: Not on file   Years of education: Not on file   Highest education level: Not on file  Occupational History   Not on file  Tobacco Use   Smoking status: Former    Current packs/day: 0.00    Types: Cigarettes    Quit date: 12/01/2019    Years since quitting: 4.4   Smokeless tobacco: Never  Substance and Sexual Activity   Alcohol use: Never   Drug use: Never   Sexual activity: Not Currently  Other Topics Concern   Not on file  Social History Narrative   Not on file   Social Drivers of Health   Financial Resource Strain: Low Risk  (12/06/2020)   Overall Financial Resource Strain (CARDIA)    Difficulty of Paying Living Expenses: Not hard at all  Food Insecurity: No Food Insecurity (12/06/2020)   Hunger Vital Sign    Worried About Running Out of Food in the Last Year: Never true    Ran Out of Food in the Last Year: Never true  Transportation Needs: No Transportation Needs (12/06/2020)   PRAPARE - Administrator, Civil Service (Medical): No    Lack of Transportation (Non-Medical): No  Physical Activity: Sufficiently Active (12/06/2020)    Exercise Vital Sign    Days of Exercise per Week: 7 days    Minutes of Exercise per Session: 30 min  Stress: No Stress Concern Present (12/06/2020)   [name] of Occupational Health - Occupational Stress Questionnaire    Feeling of Stress : Only a little  Social Connections: Moderately Integrated (12/06/2020)   Social Connection and Isolation Panel    Frequency of Communication with Friends and Family: More than three times a week    Frequency of Social Gatherings with Friends and Family: More than three times a week    Attends Religious Services: 1 to 4 times per year    Active Member of [employer] or Organizations: No    Attends Banker Meetings: 1 to 4 times per year    Marital Status: Divorced  Catering manager Violence: Not At Risk (12/06/2020)   Humiliation, Afraid, Rape, and Kick questionnaire    Fear of Current or Ex-Partner: No    Emotionally Abused: No    Physically Abused: No    Sexually Abused: No    Family History: No family history on file.  Current Medications:  Current Outpatient Medications:    albuterol  (VENTOLIN  HFA) 108 (90 Base) MCG/ACT inhaler, INHALE 2 PUFFS INTO THE LUNGS EVERY 4 HOURS AS NEEDED FOR WHEEZING OR SHORTNESS OF BREATH, Disp: 18 g, Rfl: 3   levothyroxine  (SYNTHROID ) 75 MCG tablet, Take 1 tablet (75 mcg total) by mouth daily before breakfast., Disp: 30 tablet, Rfl: 3   Allergies: No Known Allergies  REVIEW OF SYSTEMS:   Review of Systems  Constitutional:  Negative for chills, fatigue and fever.  HENT:   Negative for lump/mass, mouth sores, nosebleeds, sore throat and trouble swallowing.   Eyes:  Negative for eye problems.  Respiratory:  Positive for cough and shortness of breath.   Cardiovascular:  Negative for chest pain, leg swelling and palpitations.  Gastrointestinal:  Negative for abdominal pain,  constipation, diarrhea, nausea and vomiting.  Genitourinary:  Negative for bladder incontinence, difficulty urinating,  dysuria, frequency, hematuria and nocturia.   Musculoskeletal:  Negative for arthralgias, back pain, flank pain, myalgias and neck pain.  Skin:  Negative for itching and rash.  Neurological:  Negative for dizziness, headaches and numbness.  Hematological:  Does not bruise/bleed easily.  Psychiatric/Behavioral:  Negative for depression, sleep disturbance and suicidal ideas. The patient is not nervous/anxious.   All other systems reviewed and are negative.    VITALS:   Blood pressure 132/82, pulse 83, temperature (!) 97.4 F (36.3 C), temperature source Oral, resp. rate 16, weight 145 lb 4.5 oz (65.9 kg), SpO2 99%.  Wt Readings from Last 3 Encounters:  04/30/24 145 lb 4.5 oz (65.9 kg)  03/19/24 146 lb 9.7 oz (66.5 kg)  02/06/24 148 lb 2.4 oz (67.2 kg)    Body mass index is 21.45 kg/m.  Performance status (ECOG): 1 - Symptomatic but completely ambulatory  PHYSICAL EXAM:   Physical Exam Vitals and nursing note reviewed. Exam conducted with a chaperone present.  Constitutional:      Appearance: Normal appearance.  Cardiovascular:     Rate and Rhythm: Normal rate and regular rhythm.     Pulses: Normal pulses.     Heart sounds: Normal heart sounds.  Pulmonary:     Effort: Pulmonary effort is normal.     Breath sounds: Normal breath sounds.  Abdominal:     Palpations: Abdomen is soft. There is no hepatomegaly, splenomegaly or mass.     Tenderness: There is no abdominal tenderness.  Musculoskeletal:     Right lower leg: No edema.     Left lower leg: No edema.  Lymphadenopathy:     Cervical: No cervical adenopathy.     Right cervical: No superficial, deep or posterior cervical adenopathy.    Left cervical: No superficial, deep or posterior cervical adenopathy.     Upper Body:     Right upper body: No supraclavicular or axillary adenopathy.     Left upper body: No supraclavicular or axillary adenopathy.  Neurological:     General: No focal deficit present.     Mental Status:  He is alert and oriented to person, place, and time.  Psychiatric:        Mood and Affect: Mood normal.        Behavior: Behavior normal.     LABS:      Latest Ref Rng & Units 04/30/2024    1:13 PM 03/19/2024   11:59 AM 02/06/2024   12:11 PM  CBC  WBC 4.0 - 10.5 K/uL 5.7  4.5  5.7   Hemoglobin 13.0 - 17.0 g/dL 85.3  86.7  85.9   Hematocrit 39.0 - 52.0 % 43.1  39.5  41.7   Platelets 150 - 400 K/uL 261  241  259       Latest Ref Rng & Units 04/30/2024    1:13 PM 03/19/2024   11:59 AM 02/06/2024   12:11 PM  CMP  Glucose 70 - 99 mg/dL 64  94  98   BUN 8 - 23 mg/dL 15  12  14    Creatinine 0.61 - 1.24 mg/dL 9.06  9.20  9.13   Sodium 135 - 145 mmol/L 133  130  129   Potassium 3.5 - 5.1 mmol/L 4.1  4.5  4.9   Chloride 98 - 111 mmol/L 95  96  94   CO2 22 - 32 mmol/L 26  27  27  Calcium 8.9 - 10.3 mg/dL 9.1  9.0  9.0   Total Protein 6.5 - 8.1 g/dL 7.2  6.9  6.9   Total Bilirubin 0.0 - 1.2 mg/dL 0.8  0.6  0.7   Alkaline Phos 38 - 126 U/L 78  66  69   AST 15 - 41 U/L 20  18  18    ALT 0 - 44 U/L 13  14  13       No results found for: CEA1, CEA / No results found for: CEA1, CEA No results found for: PSA1 No results found for: CAN199 No results found for: CAN125  No results found for: TOTALPROTELP, ALBUMINELP, A1GS, A2GS, BETS, BETA2SER, GAMS, MSPIKE, SPEI No results found for: TIBC, FERRITIN, IRONPCTSAT No results found for: LDH   STUDIES:   MR Brain W Wo Contrast Result Date: 04/24/2024 CLINICAL DATA:  Metastatic disease evaluation [facility] EXAM: MRI HEAD WITHOUT AND WITH CONTRAST TECHNIQUE: Multiplanar, multiecho pulse sequences of the brain and surrounding structures were obtained without and with intravenous contrast. CONTRAST:  7mL GADAVIST  GADOBUTROL  1 MMOL/ML IV SOLN COMPARISON:  MRI of the head dated March 10, 2023. FINDINGS: Brain: Age-related cerebral volume loss. Mild periventricular white matter disease. There is no evidence of  hemorrhage, mass, cortical infarct or hydrocephalus. There is no abnormal parenchymal or meningeal enhancement. Vascular: Normal flow voids. Skull and upper cervical spine: Normal marrow signal. No osseous lesions. The left frontal scalp cyst is again demonstrated. An additional cyst is seen overlying the left zygomatic arch. Sinuses/Orbits: Clear paranasal sinuses.  Normal orbits. Other: None. IMPRESSION: 1. No evidence of metastatic disease to the brain. 2. Mild periventricular white matter disease. Electronically Signed   By: Evalene Coho M.D.   On: 04/24/2024 03:59

## 2024-04-30 NOTE — Progress Notes (Signed)
Labs reviewed, ok to treat per MD.   Treatment given per orders. Patient tolerated it well without problems. Vitals stable and discharged home from clinic ambulatory. Follow up as scheduled.  

## 2024-04-30 NOTE — Patient Instructions (Signed)
 [facility] [place] - A DEPT OF [facility]  Discharge Instructions: Thank you for choosing [facility] to provide your oncology and hematology care.  If you have a lab appointment with the Cancer Center - please note that after April 8th, 2024, all labs will be drawn in the cancer center.  You do not have to check in or register with the main entrance as you have in the past but will complete your check-in in the cancer center.  Wear comfortable clothing and clothing appropriate for easy access to any Portacath or PICC line.   We strive to give you quality time with your provider. You may need to reschedule your appointment if you arrive late (15 or more minutes).  Arriving late affects you and other patients whose appointments are after yours.  Also, if you miss three or more appointments without notifying the office, you may be dismissed from the clinic at the provider's discretion.      For prescription refill requests, have your pharmacy contact our office and allow 72 hours for refills to be completed.    Today you received the following chemotherapy and/or immunotherapy agents Keytruda      To help prevent nausea and vomiting after your treatment, we encourage you to take your nausea medication as directed.  BELOW ARE SYMPTOMS THAT SHOULD BE REPORTED IMMEDIATELY: *FEVER GREATER THAN 100.4 F (38 C) OR HIGHER *CHILLS OR SWEATING *NAUSEA AND VOMITING THAT IS NOT CONTROLLED WITH YOUR NAUSEA MEDICATION *UNUSUAL SHORTNESS OF BREATH *UNUSUAL BRUISING OR BLEEDING *URINARY PROBLEMS (pain or burning when urinating, or frequent urination) *BOWEL PROBLEMS (unusual diarrhea, constipation, pain near the anus) TENDERNESS IN MOUTH AND THROAT WITH OR WITHOUT PRESENCE OF ULCERS (sore throat, sores in mouth, or a toothache) UNUSUAL RASH, SWELLING OR PAIN  UNUSUAL VAGINAL DISCHARGE OR ITCHING   Items with * indicate a potential emergency and should be followed up  as soon as possible or go to the Emergency Department if any problems should occur.  Please show the CHEMOTHERAPY ALERT CARD or IMMUNOTHERAPY ALERT CARD at check-in to the Emergency Department and triage nurse.  Should you have questions after your visit or need to cancel or reschedule your appointment, please contact [facility]  - A DEPT OF [facility] [phone]  and follow the prompts.  Office hours are 8:00 a.m. to 4:30 p.m. Monday - Friday. Please note that voicemails left after 4:00 p.m. may not be returned until the following business day.  We are closed weekends and major holidays. You have access to a nurse at all times for urgent questions. Please call the main number to the clinic [phone] and follow the prompts.  For any non-urgent questions, you may also contact your provider using MyChart. We now offer e-Visits for anyone 30 and older to request care online for non-urgent symptoms. For details visit [email or web address].   Also download the MyChart app! Go to the app store, search "MyChart", open the app, select [place], and log in with your MyChart username and password.

## 2024-04-30 NOTE — Progress Notes (Signed)
 Patient has been examined by Dr. Ellin Saba. Vital signs and labs have been reviewed by MD - ANC, Creatinine, LFTs, hemoglobin, and platelets are within treatment parameters per M.D. - pt may proceed with treatment.  Primary RN and pharmacy notified.

## 2024-04-30 NOTE — Patient Instructions (Signed)
 [facility] at [facility] Discharge Instructions   You were seen and examined today by Dr. Rogers.  He reviewed the results of your lab work which are normal/stable.   He reviewed the results of your brain MRI which did not show any evidence of cancer.   We will proceed with your treatment today.   Return as scheduled.    Thank you for choosing [facility] at [facility] to provide your oncology and hematology care.  To afford each patient quality time with our provider, please arrive at least 15 minutes before your scheduled appointment time.   If you have a lab appointment with the Cancer Center please come in thru the Main Entrance and check in at the main information desk.  You need to re-schedule your appointment should you arrive 10 or more minutes late.  We strive to give you quality time with our providers, and arriving late affects you and other patients whose appointments are after yours.  Also, if you no show three or more times for appointments you may be dismissed from the clinic at the providers discretion.     Again, thank you for choosing [facility].  Our hope is that these requests will decrease the amount of time that you wait before being seen by our physicians.       _____________________________________________________________  Should you have questions after your visit to [facility], please contact our office at [phone] and follow the prompts.  Our office hours are 8:00 a.m. and 4:30 p.m. Monday - Friday.  Please note that voicemails left after 4:00 p.m. may not be returned until the following business day.  We are closed weekends and major holidays.  You do have access to a nurse 24-7, just call the main number to the clinic [phone] and do not press any options, hold on the line and a nurse will answer the phone.    For prescription refill requests, have your pharmacy contact our  office and allow 72 hours.    Due to Covid, you will need to wear a mask upon entering the hospital. If you do not have a mask, a mask will be given to you at the Main Entrance upon arrival. For doctor visits, patients may have 1 support person age 13 or older with them. For treatment visits, patients can not have anyone with them due to social distancing guidelines and our immunocompromised population.

## 2024-05-01 ENCOUNTER — Other Ambulatory Visit: Payer: Self-pay

## 2024-05-02 LAB — T4: T4, Total: 7.8 ug/dL (ref 4.5–12.0)

## 2024-05-04 ENCOUNTER — Other Ambulatory Visit: Payer: Self-pay

## 2024-06-09 ENCOUNTER — Other Ambulatory Visit: Payer: Self-pay | Admitting: *Deleted

## 2024-06-09 DIAGNOSIS — C3491 Malignant neoplasm of unspecified part of right bronchus or lung: Secondary | ICD-10-CM

## 2024-06-09 MED ORDER — ALBUTEROL SULFATE HFA 108 (90 BASE) MCG/ACT IN AERS
2.0000 | INHALATION_SPRAY | RESPIRATORY_TRACT | 3 refills | Status: DC | PRN
Start: 2024-06-09 — End: 2024-08-06

## 2024-06-11 ENCOUNTER — Inpatient Hospital Stay: Attending: Hematology

## 2024-06-11 ENCOUNTER — Inpatient Hospital Stay

## 2024-06-11 ENCOUNTER — Encounter: Payer: Self-pay | Admitting: Oncology

## 2024-06-11 VITALS — BP 137/80 | HR 85 | Temp 97.4°F | Resp 20 | Wt 145.4 lb

## 2024-06-11 DIAGNOSIS — Z5112 Encounter for antineoplastic immunotherapy: Secondary | ICD-10-CM | POA: Diagnosis present

## 2024-06-11 DIAGNOSIS — C3491 Malignant neoplasm of unspecified part of right bronchus or lung: Secondary | ICD-10-CM

## 2024-06-11 DIAGNOSIS — C3411 Malignant neoplasm of upper lobe, right bronchus or lung: Secondary | ICD-10-CM | POA: Diagnosis present

## 2024-06-11 DIAGNOSIS — Z79620 Long term (current) use of immunosuppressive biologic: Secondary | ICD-10-CM | POA: Diagnosis not present

## 2024-06-11 LAB — CBC WITH DIFFERENTIAL/PLATELET
Abs Immature Granulocytes: 0.02 K/uL (ref 0.00–0.07)
Basophils Absolute: 0.1 K/uL (ref 0.0–0.1)
Basophils Relative: 1 %
Eosinophils Absolute: 0.4 K/uL (ref 0.0–0.5)
Eosinophils Relative: 6 %
HCT: 40.1 % (ref 39.0–52.0)
Hemoglobin: 13.4 g/dL (ref 13.0–17.0)
Immature Granulocytes: 0 %
Lymphocytes Relative: 12 %
Lymphs Abs: 0.8 K/uL (ref 0.7–4.0)
MCH: 31.9 pg (ref 26.0–34.0)
MCHC: 33.4 g/dL (ref 30.0–36.0)
MCV: 95.5 fL (ref 80.0–100.0)
Monocytes Absolute: 0.7 K/uL (ref 0.1–1.0)
Monocytes Relative: 11 %
Neutro Abs: 4.6 K/uL (ref 1.7–7.7)
Neutrophils Relative %: 70 %
Platelets: 225 K/uL (ref 150–400)
RBC: 4.2 MIL/uL — ABNORMAL LOW (ref 4.22–5.81)
RDW: 12.8 % (ref 11.5–15.5)
WBC: 6.6 K/uL (ref 4.0–10.5)
nRBC: 0 % (ref 0.0–0.2)

## 2024-06-11 LAB — COMPREHENSIVE METABOLIC PANEL WITH GFR
ALT: 16 U/L (ref 0–44)
AST: 22 U/L (ref 15–41)
Albumin: 4.1 g/dL (ref 3.5–5.0)
Alkaline Phosphatase: 75 U/L (ref 38–126)
Anion gap: 13 (ref 5–15)
BUN: 13 mg/dL (ref 8–23)
CO2: 26 mmol/L (ref 22–32)
Calcium: 9 mg/dL (ref 8.9–10.3)
Chloride: 92 mmol/L — ABNORMAL LOW (ref 98–111)
Creatinine, Ser: 0.9 mg/dL (ref 0.61–1.24)
GFR, Estimated: >60 mL/min (ref >60)
Glucose, Bld: 110 mg/dL — ABNORMAL HIGH (ref 70–99)
Potassium: 4.6 mmol/L (ref 3.5–5.1)
Sodium: 131 mmol/L — ABNORMAL LOW (ref 135–145)
Total Bilirubin: 0.7 mg/dL (ref 0.0–1.2)
Total Protein: 7.3 g/dL (ref 6.5–8.1)

## 2024-06-11 LAB — TSH: TSH: 7.44 u[IU]/mL — ABNORMAL HIGH (ref 0.350–4.500)

## 2024-06-11 MED ORDER — SODIUM CHLORIDE 0.9 % IV SOLN: Freq: Once | INTRAVENOUS | Status: AC

## 2024-06-11 MED ORDER — SODIUM CHLORIDE 0.9 % IV SOLN
400.0000 mg | Freq: Once | INTRAVENOUS | Status: AC
  Administered 2024-06-11: 400 mg via INTRAVENOUS
  Filled 2024-06-11: qty 16

## 2024-06-11 NOTE — Progress Notes (Signed)
 Patient tolerated chemotherapy with no complaints voiced.  Side effects with management reviewed with understanding verbalized.  IV site clean and dry with no bruising or swelling noted at site.  Good blood return noted before and after administration of chemotherapy.  Band aid applied.  Patient left in satisfactory condition with VSS and no s/s of distress noted. All follow ups as scheduled.   Antonio Payne [employer]

## 2024-06-11 NOTE — Patient Instructions (Signed)
 [facility] [place] - A DEPT OF [facility]  Discharge Instructions: Thank you for choosing [facility] to provide your oncology and hematology care.  If you have a lab appointment with the Cancer Center - please note that after April 8th, 2024, all labs will be drawn in the cancer center.  You do not have to check in or register with the main entrance as you have in the past but will complete your check-in in the cancer center.  Wear comfortable clothing and clothing appropriate for easy access to any Portacath or PICC line.   We strive to give you quality time with your provider. You may need to reschedule your appointment if you arrive late (15 or more minutes).  Arriving late affects you and other patients whose appointments are after yours.  Also, if you miss three or more appointments without notifying the office, you may be dismissed from the clinic at the provider's discretion.      For prescription refill requests, have your pharmacy contact our office and allow 72 hours for refills to be completed.    Today you received the following chemotherapy and/or immunotherapy agents keytruda     To help prevent nausea and vomiting after your treatment, we encourage you to take your nausea medication as directed.  BELOW ARE SYMPTOMS THAT SHOULD BE REPORTED IMMEDIATELY: *FEVER GREATER THAN 100.4 F (38 C) OR HIGHER *CHILLS OR SWEATING *NAUSEA AND VOMITING THAT IS NOT CONTROLLED WITH YOUR NAUSEA MEDICATION *UNUSUAL SHORTNESS OF BREATH *UNUSUAL BRUISING OR BLEEDING *URINARY PROBLEMS (pain or burning when urinating, or frequent urination) *BOWEL PROBLEMS (unusual diarrhea, constipation, pain near the anus) TENDERNESS IN MOUTH AND THROAT WITH OR WITHOUT PRESENCE OF ULCERS (sore throat, sores in mouth, or a toothache) UNUSUAL RASH, SWELLING OR PAIN  UNUSUAL VAGINAL DISCHARGE OR ITCHING   Items with * indicate a potential emergency and should be followed up as  soon as possible or go to the Emergency Department if any problems should occur.  Please show the CHEMOTHERAPY ALERT CARD or IMMUNOTHERAPY ALERT CARD at check-in to the Emergency Department and triage nurse.  Should you have questions after your visit or need to cancel or reschedule your appointment, please contact [facility] [place] - A DEPT OF [facility] [phone]  and follow the prompts.  Office hours are 8:00 a.m. to 4:30 p.m. Monday - Friday. Please note that voicemails left after 4:00 p.m. may not be returned until the following business day.  We are closed weekends and major holidays. You have access to a nurse at all times for urgent questions. Please call the main number to the clinic [phone] and follow the prompts.  For any non-urgent questions, you may also contact your provider using MyChart. We now offer e-Visits for anyone 47 and older to request care online for non-urgent symptoms. For details visit [email or web address].   Also download the MyChart app! Go to the app store, search MyChart, open the app, select  Run, and log in with your MyChart username and password.

## 2024-06-12 LAB — T4: T4, Total: 8.8 ug/dL (ref 4.5–12.0)

## 2024-06-16 ENCOUNTER — Other Ambulatory Visit: Payer: Self-pay | Admitting: *Deleted

## 2024-06-16 MED ORDER — LEVOTHYROXINE SODIUM 75 MCG PO TABS: 75.0000 ug | ORAL_TABLET | Freq: Every day | ORAL | 3 refills | Status: DC

## 2024-06-23 ENCOUNTER — Encounter: Payer: Self-pay | Admitting: Oncology

## 2024-07-16 ENCOUNTER — Emergency Department (HOSPITAL_COMMUNITY)

## 2024-07-16 ENCOUNTER — Encounter: Payer: Self-pay | Admitting: Oncology

## 2024-07-16 ENCOUNTER — Other Ambulatory Visit: Payer: Self-pay

## 2024-07-16 ENCOUNTER — Encounter (HOSPITAL_COMMUNITY): Payer: Self-pay

## 2024-07-16 ENCOUNTER — Emergency Department (HOSPITAL_COMMUNITY): Admission: EM | Admit: 2024-07-16 | Discharge: 2024-07-16 | Disposition: A | Discharge status: Home / Self Care

## 2024-07-16 DIAGNOSIS — R778 Other specified abnormalities of plasma proteins: Secondary | ICD-10-CM | POA: Insufficient documentation

## 2024-07-16 DIAGNOSIS — Z7951 Long term (current) use of inhaled steroids: Secondary | ICD-10-CM | POA: Insufficient documentation

## 2024-07-16 DIAGNOSIS — Z85118 Personal history of other malignant neoplasm of bronchus and lung: Secondary | ICD-10-CM | POA: Diagnosis not present

## 2024-07-16 DIAGNOSIS — J441 Chronic obstructive pulmonary disease with (acute) exacerbation: Secondary | ICD-10-CM | POA: Diagnosis not present

## 2024-07-16 DIAGNOSIS — R0602 Shortness of breath: Secondary | ICD-10-CM | POA: Diagnosis present

## 2024-07-16 DIAGNOSIS — R7989 Other specified abnormal findings of blood chemistry: Secondary | ICD-10-CM

## 2024-07-16 DIAGNOSIS — R791 Abnormal coagulation profile: Secondary | ICD-10-CM | POA: Insufficient documentation

## 2024-07-16 DIAGNOSIS — M7989 Other specified soft tissue disorders: Secondary | ICD-10-CM | POA: Diagnosis not present

## 2024-07-16 LAB — CBC WITH DIFFERENTIAL/PLATELET
Abs Immature Granulocytes: 0.02 K/uL (ref 0.00–0.07)
Basophils Absolute: 0.1 K/uL (ref 0.0–0.1)
Basophils Relative: 1 %
Eosinophils Absolute: 0.3 K/uL (ref 0.0–0.5)
Eosinophils Relative: 5 %
HCT: 37.9 % — ABNORMAL LOW (ref 39.0–52.0)
Hemoglobin: 12.6 g/dL — ABNORMAL LOW (ref 13.0–17.0)
Immature Granulocytes: 0 %
Lymphocytes Relative: 15 %
Lymphs Abs: 0.9 K/uL (ref 0.7–4.0)
MCH: 32 pg (ref 26.0–34.0)
MCHC: 33.2 g/dL (ref 30.0–36.0)
MCV: 96.2 fL (ref 80.0–100.0)
Monocytes Absolute: 0.6 K/uL (ref 0.1–1.0)
Monocytes Relative: 10 %
Neutro Abs: 4.3 K/uL (ref 1.7–7.7)
Neutrophils Relative %: 69 %
Platelets: 264 K/uL (ref 150–400)
RBC: 3.94 MIL/uL — ABNORMAL LOW (ref 4.22–5.81)
RDW: 13.2 % (ref 11.5–15.5)
WBC: 6.3 K/uL (ref 4.0–10.5)
nRBC: 0 % (ref 0.0–0.2)

## 2024-07-16 LAB — COMPREHENSIVE METABOLIC PANEL WITH GFR
ALT: 16 U/L (ref 0–44)
AST: 27 U/L (ref 15–41)
Albumin: 4.3 g/dL (ref 3.5–5.0)
Alkaline Phosphatase: 80 U/L (ref 38–126)
Anion gap: 9 (ref 5–15)
BUN: 18 mg/dL (ref 8–23)
CO2: 27 mmol/L (ref 22–32)
Calcium: 9.1 mg/dL (ref 8.9–10.3)
Chloride: 91 mmol/L — ABNORMAL LOW (ref 98–111)
Creatinine, Ser: 0.88 mg/dL (ref 0.61–1.24)
GFR, Estimated: >60 mL/min (ref >60)
Glucose, Bld: 116 mg/dL — ABNORMAL HIGH (ref 70–99)
Potassium: 5.6 mmol/L — ABNORMAL HIGH (ref 3.5–5.1)
Sodium: 127 mmol/L — ABNORMAL LOW (ref 135–145)
Total Bilirubin: 0.3 mg/dL (ref 0.0–1.2)
Total Protein: 6.7 g/dL (ref 6.5–8.1)

## 2024-07-16 LAB — TROPONIN T, HIGH SENSITIVITY
Troponin T High Sensitivity: 28 ng/L — ABNORMAL HIGH (ref 0–19)
Troponin T High Sensitivity: 24 ng/L — ABNORMAL HIGH (ref 0–19)

## 2024-07-16 LAB — PRO BRAIN NATRIURETIC PEPTIDE: Pro Brain Natriuretic Peptide: 6828 pg/mL — ABNORMAL HIGH (ref <300.0)

## 2024-07-16 MED ORDER — METHYLPREDNISOLONE SODIUM SUCC 125 MG IJ SOLR
125.0000 mg | Freq: Once | INTRAMUSCULAR | Status: AC
  Administered 2024-07-16: 125 mg via INTRAVENOUS
  Filled 2024-07-16: qty 2

## 2024-07-16 MED ORDER — ALBUTEROL SULFATE HFA 108 (90 BASE) MCG/ACT IN AERS
2.0000 | INHALATION_SPRAY | Freq: Once | RESPIRATORY_TRACT | Status: AC
  Administered 2024-07-16: 2 via RESPIRATORY_TRACT
  Filled 2024-07-16: qty 6.7

## 2024-07-16 MED ORDER — FUROSEMIDE 10 MG/ML IJ SOLN
20.0000 mg | Freq: Once | INTRAMUSCULAR | Status: AC
  Administered 2024-07-16: 20 mg via INTRAVENOUS
  Filled 2024-07-16: qty 2

## 2024-07-16 MED ORDER — IPRATROPIUM-ALBUTEROL 0.5-2.5 (3) MG/3ML IN SOLN
3.0000 mL | Freq: Once | RESPIRATORY_TRACT | Status: AC
  Administered 2024-07-16: 3 mL via RESPIRATORY_TRACT
  Filled 2024-07-16: qty 3

## 2024-07-16 MED ORDER — PREDNISONE 10 MG PO TABS: 40.0000 mg | ORAL_TABLET | Freq: Every day | ORAL | 0 refills | Status: AC

## 2024-07-16 NOTE — ED Provider Notes (Signed)
 [place] EMERGENCY DEPARTMENT AT [facility] Provider Note   CSN: [id number] Arrival date & time: 07/16/24  9049     Patient presents with: Shortness of Breath   Antonio Payne is a 68 y.o. male.   68 year old male presents for evaluation of shortness of breath.  He has a history of lung cancer and COPD.  States his inhaler has not been helping.  He states over the last few days he has noticed increase in wheezing and coughing.  Denies any fevers or chills.  Denies any other symptoms or concerns at this time.   Shortness of Breath Associated symptoms: cough   Associated symptoms: no abdominal pain, no chest pain, no ear pain, no fever, no rash, no sore throat and no vomiting        Prior to Admission medications   Medication Sig Start Date End Date Taking? Authorizing Provider  albuterol  (VENTOLIN  HFA) 108 (90 Base) MCG/ACT inhaler Inhale 2 puffs into the lungs every 4 (four) hours as needed for wheezing or shortness of breath. 06/09/24  Yes Kandala, Hyndavi, MD  levothyroxine  (SYNTHROID ) 75 MCG tablet Take 1 tablet (75 mcg total) by mouth daily before breakfast. 06/16/24  Yes Kandala, Hyndavi, MD  predniSONE  (DELTASONE ) 10 MG tablet Take 4 tablets (40 mg total) by mouth daily for 4 days. 07/16/24 07/20/24 Yes Davonne Jarnigan L, DO    Allergies: Patient has no known allergies.    Review of Systems  Constitutional:  Negative for chills and fever.  HENT:  Negative for ear pain and sore throat.   Eyes:  Negative for pain and visual disturbance.  Respiratory:  Positive for cough and shortness of breath.   Cardiovascular:  Negative for chest pain and palpitations.  Gastrointestinal:  Negative for abdominal pain and vomiting.  Genitourinary:  Negative for dysuria and hematuria.  Musculoskeletal:  Negative for arthralgias and back pain.  Skin:  Negative for color change and rash.  Neurological:  Negative for seizures and syncope.  All other systems reviewed and are  negative.   Updated Vital Signs BP (!) 138/93   Pulse (!) 101   Temp 98 F (36.7 C) (Oral)   Resp 18   Ht 5' 9 (1.753 m)   Wt 67 kg   SpO2 95%   BMI 21.81 kg/m   Physical Exam Vitals and nursing note reviewed.  Constitutional:      General: He is not in acute distress.    Appearance: He is well-developed. He is not ill-appearing.  HENT:     Head: Normocephalic and atraumatic.  Eyes:     Conjunctiva/sclera: Conjunctivae normal.  Cardiovascular:     Rate and Rhythm: Regular rhythm. Tachycardia present.     Heart sounds: No murmur heard. Pulmonary:     Effort: Pulmonary effort is normal. No respiratory distress.     Breath sounds: Decreased breath sounds and wheezing present.  Abdominal:     Palpations: Abdomen is soft.     Tenderness: There is no abdominal tenderness.  Musculoskeletal:        General: No swelling.     Cervical back: Neck supple.  Skin:    General: Skin is warm and dry.     Capillary Refill: Capillary refill takes less than 2 seconds.  Neurological:     Mental Status: He is alert.  Psychiatric:        Mood and Affect: Mood normal.     (all labs ordered are listed, but only abnormal results are  displayed) Labs Reviewed  CBC WITH DIFFERENTIAL/PLATELET - Abnormal; Notable for the following components:      Result Value   RBC 3.94 (*)    Hemoglobin 12.6 (*)    HCT 37.9 (*)    All other components within normal limits  COMPREHENSIVE METABOLIC PANEL WITH GFR - Abnormal; Notable for the following components:   Sodium 127 (*)    Potassium 5.6 (*)    Chloride 91 (*)    Glucose, Bld 116 (*)    All other components within normal limits  PRO BRAIN NATRIURETIC PEPTIDE - Abnormal; Notable for the following components:   Pro Brain Natriuretic Peptide 6,828.0 (*)    All other components within normal limits  TROPONIN T, HIGH SENSITIVITY - Abnormal; Notable for the following components:   Troponin T High Sensitivity 28 (*)    All other components within  normal limits  TROPONIN T, HIGH SENSITIVITY - Abnormal; Notable for the following components:   Troponin T High Sensitivity 24 (*)    All other components within normal limits    EKG: EKG Interpretation Date/Time:  Wednesday July 16 2024 10:11:45 EDT Ventricular Rate:  103 PR Interval:  134 QRS Duration:  90 QT Interval:  343 QTC Calculation: 449 R Axis:   -17  Text Interpretation: Sinus tachycardia Ventricular premature complex Borderline left axis deviation Nonspecific T abnormalities, lateral leads Confirmed by Gennaro Bouchard ([id number]) on 07/16/2024 10:33:59 AM  Radiology: [facility] Chest 2 View Result Date: 07/16/2024 CLINICAL DATA:  Shortness of breath and wheezing.  Lung cancer. EXAM: CHEST - 2 VIEW COMPARISON:  02/25/2021 and CT chest 03/12/2024. FINDINGS: Trachea is midline. Heart is at the upper limits of normal in size. Thoracic aorta is calcified. Spiculated right upper lobe nodule with surrounding architectural distortion. Known medial left lower lobe nodule is obscured. Peripheral septal thickening in the lower right hemithorax. Lungs are emphysematous otherwise clear. IMPRESSION: 1. Suspect mild pulmonary edema. 2. Right upper lobe nodule, compatible with the provided history of lung cancer. Known medial left lower lobe nodule is better seen on CT chest 03/12/2024. 3.  Emphysema (ICD10-J43.9). Electronically Signed   By: Newell Eke M.D.   On: 07/16/2024 10:56     Procedures   Medications Ordered in the ED  ipratropium-albuterol  (DUONEB) 0.5-2.5 (3) MG/3ML nebulizer solution 3 mL (3 mLs Nebulization Given 07/16/24 1039)  methylPREDNISolone  sodium succinate (SOLU-MEDROL ) 125 mg/2 mL injection 125 mg (125 mg Intravenous Given 07/16/24 1042)  furosemide (LASIX) injection 20 mg (20 mg Intravenous Given 07/16/24 1252)  albuterol  (VENTOLIN  HFA) 108 (90 Base) MCG/ACT inhaler 2 puff (2 puffs Inhalation Given 07/16/24 1407)                                    Medical Decision  Making Cardiac monitor interpretation: Sinus tachycardia, no ectopy  Patient here for shortness of breath in the setting of COPD and lung cancer.  He was feeling much better after breathing treatment and steroids.  Wheezing is resolved.  Was found to have slightly elevated troponins, however they are flat.  He also had elevated BNP.  He states he has no history of heart failure.  Does have some slight swelling in his legs.  Will start him on Lasix.  He was given 1 dose here.  I did offer and recommend admission for further workup and management but he would like to go home and will plan to follow-up with  his specialist and primary care doctors.  Given very strict return precautions to return to the ER for new or worsening symptoms.  He feels comfortable being discharged.  Will give prescription for 4 more days of steroids as well for his COPD and refill his albuterol .  Problems Addressed: COPD exacerbation (HCC): acute illness or injury Elevated brain natriuretic peptide (BNP) level: undiagnosed new problem with uncertain prognosis Elevated troponin: undiagnosed new problem with uncertain prognosis  Amount and/or Complexity of Data Reviewed External Data Reviewed: notes.    Details: Outpatient records reviewed and patient is being treated currently for adenocarcinoma of the lungs Labs: ordered. Decision-making details documented in ED Course.    Details: Ordered and reviewed by me and troponins are elevated but flat, pro-bnp elevated, otherwise unremarkable  Radiology: ordered and independent interpretation performed. Decision-making details documented in ED Course.    Details: Ordered and inter by me independently of radiology Chest x-ray: Shows no acute abnormality, but does show known lung mass ECG/medicine tests: ordered and independent interpretation performed. Decision-making details documented in ED Course.    Details: EKG ordered and interpreted by me in the absence of cardiology and shows  nonspecific changes but no STEMI  Risk OTC drugs. Prescription drug management. Drug therapy requiring intensive monitoring for toxicity. Diagnosis or treatment significantly limited by social determinants of health.    Final diagnoses:  COPD exacerbation (HCC)  Elevated brain natriuretic peptide (BNP) level  Elevated troponin    ED Discharge Orders          Ordered    predniSONE  (DELTASONE ) 10 MG tablet  Daily        07/16/24 1346               Tatiyana Foucher, Duwaine L, DO 07/16/24 1528

## 2024-07-16 NOTE — ED Notes (Signed)
 Pt in bed, pt states that he is breathing better and denies pain.

## 2024-07-16 NOTE — ED Triage Notes (Signed)
 Pt arrived via pOV c/o on-going SOB. Pt also reports Hx of having fluid around his heart. Pt present wheezing in Triage and reports struggling with lung cancer currently.

## 2024-07-16 NOTE — Discharge Instructions (Signed)
 Continue your inhalers and take your steroids as prescribed.  You had some elevations in your BNP and troponin which are markers of your heart function while you are in the ER.  Is important that you call and follow-up with your primary care doctor and specialist to try to get in with cardiology for an echocardiogram.  Please return to the ER for any new or worsening symptoms or if you develop chest pain.

## 2024-07-23 ENCOUNTER — Inpatient Hospital Stay

## 2024-07-23 ENCOUNTER — Encounter: Payer: Self-pay | Admitting: Oncology

## 2024-07-23 ENCOUNTER — Inpatient Hospital Stay (HOSPITAL_BASED_OUTPATIENT_CLINIC_OR_DEPARTMENT_OTHER): Admitting: Oncology

## 2024-07-23 ENCOUNTER — Inpatient Hospital Stay: Attending: Hematology

## 2024-07-23 VITALS — BP 124/87 | HR 97 | Resp 20 | Wt 162.0 lb

## 2024-07-23 DIAGNOSIS — I509 Heart failure, unspecified: Secondary | ICD-10-CM

## 2024-07-23 DIAGNOSIS — E271 Primary adrenocortical insufficiency: Secondary | ICD-10-CM

## 2024-07-23 DIAGNOSIS — C7931 Secondary malignant neoplasm of brain: Secondary | ICD-10-CM | POA: Insufficient documentation

## 2024-07-23 DIAGNOSIS — Z79899 Other long term (current) drug therapy: Secondary | ICD-10-CM | POA: Diagnosis not present

## 2024-07-23 DIAGNOSIS — C3411 Malignant neoplasm of upper lobe, right bronchus or lung: Secondary | ICD-10-CM | POA: Insufficient documentation

## 2024-07-23 DIAGNOSIS — E039 Hypothyroidism, unspecified: Secondary | ICD-10-CM

## 2024-07-23 DIAGNOSIS — R7989 Other specified abnormal findings of blood chemistry: Secondary | ICD-10-CM | POA: Diagnosis not present

## 2024-07-23 DIAGNOSIS — E2740 Unspecified adrenocortical insufficiency: Secondary | ICD-10-CM | POA: Diagnosis not present

## 2024-07-23 DIAGNOSIS — C3491 Malignant neoplasm of unspecified part of right bronchus or lung: Secondary | ICD-10-CM | POA: Diagnosis not present

## 2024-07-23 LAB — CBC WITH DIFFERENTIAL/PLATELET
Abs Immature Granulocytes: 0.02 K/uL (ref 0.00–0.07)
Basophils Absolute: 0 K/uL (ref 0.0–0.1)
Basophils Relative: 0 %
Eosinophils Absolute: 0.1 K/uL (ref 0.0–0.5)
Eosinophils Relative: 2 %
HCT: 39.2 % (ref 39.0–52.0)
Hemoglobin: 13.1 g/dL (ref 13.0–17.0)
Immature Granulocytes: 0 %
Lymphocytes Relative: 11 %
Lymphs Abs: 0.9 K/uL (ref 0.7–4.0)
MCH: 32.4 pg (ref 26.0–34.0)
MCHC: 33.4 g/dL (ref 30.0–36.0)
MCV: 97 fL (ref 80.0–100.0)
Monocytes Absolute: 0.8 K/uL (ref 0.1–1.0)
Monocytes Relative: 9 %
Neutro Abs: 6.9 K/uL (ref 1.7–7.7)
Neutrophils Relative %: 78 %
Platelets: 286 K/uL (ref 150–400)
RBC: 4.04 MIL/uL — ABNORMAL LOW (ref 4.22–5.81)
RDW: 13.5 % (ref 11.5–15.5)
WBC: 8.8 K/uL (ref 4.0–10.5)
nRBC: 0 % (ref 0.0–0.2)

## 2024-07-23 LAB — COMPREHENSIVE METABOLIC PANEL WITH GFR
ALT: 43 U/L (ref 0–44)
AST: 32 U/L (ref 15–41)
Albumin: 4.2 g/dL (ref 3.5–5.0)
Alkaline Phosphatase: 91 U/L (ref 38–126)
Anion gap: 8 (ref 5–15)
BUN: 19 mg/dL (ref 8–23)
CO2: 28 mmol/L (ref 22–32)
Calcium: 8.8 mg/dL — ABNORMAL LOW (ref 8.9–10.3)
Chloride: 92 mmol/L — ABNORMAL LOW (ref 98–111)
Creatinine, Ser: 0.82 mg/dL (ref 0.61–1.24)
GFR, Estimated: >60 mL/min (ref >60)
Glucose, Bld: 105 mg/dL — ABNORMAL HIGH (ref 70–99)
Potassium: 5.2 mmol/L — ABNORMAL HIGH (ref 3.5–5.1)
Sodium: 128 mmol/L — ABNORMAL LOW (ref 135–145)
Total Bilirubin: 0.4 mg/dL (ref 0.0–1.2)
Total Protein: 6.5 g/dL (ref 6.5–8.1)

## 2024-07-23 LAB — TSH: TSH: 14 u[IU]/mL — ABNORMAL HIGH (ref 0.350–4.500)

## 2024-07-23 MED ORDER — LEVOTHYROXINE SODIUM 100 MCG PO TABS: 100.0000 ug | ORAL_TABLET | Freq: Every day | ORAL | 2 refills | Status: DC

## 2024-07-23 NOTE — Patient Instructions (Signed)
 [facility] at [facility] Discharge Instructions   You were seen and examined today by Dr. Davonna.  She reviewed the results of your lab work which are normal/stable.   We will hold your treatment today. We will move up your CT scan due to your shortness of breath. We will also get an echocardiogram of your heart.   Return as scheduled.    Thank you for choosing [facility] at [facility] to provide your oncology and hematology care.  To afford each patient quality time with our provider, please arrive at least 15 minutes before your scheduled appointment time.   If you have a lab appointment with the Cancer Center please come in thru the Main Entrance and check in at the main information desk.  You need to re-schedule your appointment should you arrive 10 or more minutes late.  We strive to give you quality time with our providers, and arriving late affects you and other patients whose appointments are after yours.  Also, if you no show three or more times for appointments you may be dismissed from the clinic at the providers discretion.     Again, thank you for choosing [facility].  Our hope is that these requests will decrease the amount of time that you wait before being seen by our physicians.       _____________________________________________________________  Should you have questions after your visit to [facility], please contact our office at [phone] and follow the prompts.  Our office hours are 8:00 a.m. and 4:30 p.m. Monday - Friday.  Please note that voicemails left after 4:00 p.m. may not be returned until the following business day.  We are closed weekends and major holidays.  You do have access to a nurse 24-7, just call the main number to the clinic [phone] and do not press any options, hold on the line and a nurse will answer the phone.    For prescription refill requests, have your pharmacy  contact our office and allow 72 hours.    Due to Covid, you will need to wear a mask upon entering the hospital. If you do not have a mask, a mask will be given to you at the Main Entrance upon arrival. For doctor visits, patients may have 1 support person age 24 or older with them. For treatment visits, patients can not have anyone with them due to social distancing guidelines and our immunocompromised population.

## 2024-07-23 NOTE — Progress Notes (Unsigned)
 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 male was transferred to my care after his prior physician has left.   Antonio Payne:   Antonio Payne: Antonio Payne  is a 68 y.o. male with metastatic adenocarcinoma of lung  Metastatic non-small cell lung carcinoma-adenocarcinoma History of metastatic non-small cell lung carcinoma-metastatic to brain and lymph nodes diagnosed in 2020.  Has been on single agent pembrolizumab  since then. Extensive oncology history below Recurrence in axilla lymphadenopathy that is treated with radiation. Last CT scan with stable disease.  - Patient recently was in the ER for shortness of breath.  He continues to report shortness of breath today.  Will obtain CT CAP today to rule out pneumonitis and also to assess for disease progression. - Will hold treatment with pembrolizumab  today for concerns of pneumonitis.  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  - Recommended patient increase his levothyroxine  to 100 mcg daily.  Adrenal insufficiency Labs consistent with low sodium, elevated potassium and elevated TSH pointing towards adrenal insufficiency.  Likely immunotherapy induced.  - Will obtain ACTH and random cortisol level today. - Will closely monitor at this time.  Pedal edema Patient had elevated proBNP on recent testing in the ER  - Will obtain echo to rule out heart failure - Recommended to take Lasix 40 mg daily as needed.   The patient understands the plans discussed today and is in agreement with them.  He knows to contact our office if he develops concerns prior to his next  appointment.  40 minutes of total time was spent for this patient encounter, including preparation,review of records,  face-to-face counseling with the patient and coordination of care, physical exam, and documentation of the encounter.    LILLETTE Antonio SAUNDERS Teague,acting as a Neurosurgeon for Antonio Dry, Payne.,have documented all relevant documentation on the behalf of Antonio Dry, Payne,as directed by  Antonio Dry, Payne while in the presence of Antonio Dry, Payne.  I, Antonio Payne, have reviewed the above documentation for accuracy and completeness, and I agree with the above.    Antonio Payne  [facility] [place] - A DEPT OF [facility] [address] [place] [address] Dept: [phone] Dept Fax: [phone]   No orders of the defined types were placed in this encounter.    ONCOLOGY HISTORY:   I have reviewed his chart and materials related to his cancer extensively and collaborated history with the patient. 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  -05/09/2019: Left supraclavicular lymph node biopsy. Pathology: Metastatic lung adenocarcinoma, poorly differentiated. NGS: Negative for ALK, ROS1, RET, BRAF V600, and EGFR. PD-L1 TPS >95%. -05/21/2019: PET scan:Within the right upper lobe there is a markedly hypermetabolic spiculated 4.4 x 3.0 x 2.0 cm mass. Finding is compatible with the known biopsy-proven malignancy. Left level 5B and bilateral supraclavicular hypermetabolic adenopathy. Left axillary hypermetabolic adenopathy. Mediastinal and right hilar and  infrahilar hypermetabolic adenopathy. Bilateral retrocrural and retroperitoneal hypermetabolic adenopathy. Previously identified large pericardial effusion has resolved. There is a focus  of increased metabolic activity in the intercostal space between the left anterior 5th and 6th ribs likely reflecting the recent pericardial window approach.  -05/21/2019: MRI brain: 3 ring-enhancing lesions involving the left cerebral hemisphere, right pons and right cerebellum. -05/2019-current: Single agent Keytruda  400 mg every 3 weeks, transitioned to every 6 weeks  -12/24/2020: MRI Brain: NED -12/24/2020: CT CAP: Previously noted right upper lobe mass is stable in size compared to the prior study, likely to represent a treated lesion, with some surrounding postradiation changes. No definitive findings to suggest new metastatic disease elsewhere in the chest, abdomen or pelvis. Small 3-4 mm pulmonary nodules in the lungs bilaterally, nonspecific, but similar to the prior study and statistically likely to be benign. -06/21/2022: CT chest:  Irregular solid 1.4 cm posteromedial left lower lobe pulmonary nodule, slightly increased, suspicious for malignant nodule, either a metachronous primary bronchogenic malignancy versus a contralateral pulmonary metastasis. Otherwise stable chest CT. Spiculated solid 3.4 cm right upper lobe lung mass is stable. Additional tiny bilateral pulmonary nodules are stable. Mild left axillary and left retropectoral adenopathy is stable, cannot exclude metastatic adenopathy. -08/03/2022: PET:  Hypermetabolic spiculated solid 3.3 cm right upper lobe lung mass, compatible with viable primary bronchogenic carcinoma. Solid irregular 1.3 cm medial left lower lobe pulmonary nodule with low level FDG uptake, suspicious for contralateral pulmonary metastasis. Hypermetabolic left retropectoral and left axillary nodal metastases. Solitary hypermetabolic left level II neck lymph node, compatible with nodal metastasis. No hypermetabolic metastatic disease in the abdomen, pelvis or skeleton. -08/31/2022: Left axillary lymph node biopsy.  Pathology: Metastatic poorly differentiated carcinoma  to a lymph node, consistent with patient's clinical history of primary lung adenocarcinoma. -***NGS: PD-L1 TPS 100%, no other targetable mutations. TMB-low. MSI-stable. CDK N2A pathogenic variant.  -05/08/2023-05/28/2023: Radiation therapy to left axillary lymph node -09/27/2023: PET: Interval complete or near complete clearing of the previous left axillary and subpectoral adenopathy, with only hazy stranding in this vicinity currently. The dominant right upper lobe nodule is similar in size and activity to the prior exam. The superior segment left lower lobe nodule is similar in size and activity to the prior exam. -12/17/2023: CT chest:  Interval increase in the size of the spiculated nodule in the left lower lobe. Interval development of a 4 mm area of architectural nodularity in the posterior left upper lobe along the fissure. Interval decrease in the size of the spiculated dominant nodule in the right upper lobe. Resolution of the previously seen left axillary adenopathy. T8 compression fracture with approximately 50% loss of vertebral body height, new since the prior CT, and concerning for a pathologic fracture. -03/12/2024: CT chest: Stable disease.   Current Treatment:  Pembrolizumab  400mg  every 6 weeks  INTERVAL HISTORY:   JADARIUS COMMONS is here today for follow up and to establish care with me for metastatic non-small cell lung carcinoma. Patient is accompanied today.  Patient was recently in the ER for increased shortness of breath.  Labs drawn at this time showed an elevated proBNP.  Patient reported that he still has shortness of breath but is not taking any oxygen .  He was discharged with Lasix and he has been taking it as needed.  He also reports some fatigue today.  He also reports significant pedal edema that he has to wear special boots as he does not fit into other shoes.  He denies chest pain  today.   I have reviewed the past medical history, past surgical history, social history  and family history with the patient and they are unchanged from previous note.  ALLERGIES:  has no known allergies.  MEDICATIONS:  Current Outpatient Medications  Medication Sig Dispense Refill   albuterol  (VENTOLIN  HFA) 108 (90 Base) MCG/ACT inhaler Inhale 2 puffs into the lungs every 4 (four) hours as needed for wheezing or shortness of breath. 18 g 3   levothyroxine  (SYNTHROID ) 75 MCG tablet Take 1 tablet (75 mcg total) by mouth daily before breakfast. 30 tablet 3   No current facility-administered medications for this visit.    REVIEW OF SYSTEMS:   Constitutional: Denies fevers, chills or abnormal weight loss Eyes: Denies blurriness of vision Ears, nose, mouth, throat, and face: Denies mucositis or sore throat Cardiovascular: Denies palpitation, chest discomfort  Gastrointestinal:  Denies nausea, heartburn or change in bowel habits Skin: Denies abnormal skin rashes Lymphatics: Denies new lymphadenopathy or easy bruising Neurological:Denies numbness, tingling or new weaknesses Behavioral/Psych: Mood is stable, no new changes  All other systems were reviewed with the patient and are negative.   VITALS:  There were no vitals taken for this visit.  Wt Readings from Last 3 Encounters:  07/16/24 147 lb 11.3 oz (67 kg)  06/11/24 145 lb 6.4 oz (66 kg)  04/30/24 145 lb 4.5 oz (65.9 kg)    There is no height or weight on file to calculate BMI.  Performance status (ECOG): 1 - Symptomatic but completely ambulatory  PHYSICAL EXAM:   GENERAL:alert, no distress and comfortable SKIN: skin color, texture, turgor are normal, no rashes or significant lesions LYMPH:  no palpable lymphadenopathy in the cervical, axillary or inguinal LUNGS: clear to auscultation and percussion with normal breathing effort HEART: regular rate & rhythm and no murmurs and no lower extremity edema ABDOMEN:abdomen soft, non-tender and normal bowel sounds Musculoskeletal:no cyanosis of digits and no clubbing   NEURO: alert & oriented x 3 with fluent speech, no focal motor/sensory deficits  LABORATORY DATA:  I have reviewed the data as listed   Lab Results  Component Value Date   WBC 6.3 07/16/2024   NEUTROABS 4.3 07/16/2024   HGB 12.6 (L) 07/16/2024   HCT 37.9 (L) 07/16/2024   MCV 96.2 07/16/2024   PLT 264 07/16/2024      Chemistry      Component Value Date/Time   NA 128 (L) 07/23/2024 1131   K 5.2 (H) 07/23/2024 1131   CL 92 (L) 07/23/2024 1131   CO2 28 07/23/2024 1131   BUN 19 07/23/2024 1131   CREATININE 0.82 07/23/2024 1131      Component Value Date/Time   CALCIUM 8.8 (L) 07/23/2024 1131   ALKPHOS 91 07/23/2024 1131   AST 32 07/23/2024 1131   ALT 43 07/23/2024 1131   BILITOT 0.4 07/23/2024 1131       Latest Reference Range & Units 07/16/24 10:25 07/16/24 13:20  Pro Brain Natriuretic Peptide <300.0 pg/mL 6,828.0 (H)   Troponin T High Sensitivity 0 - 19 ng/L 28 (H) 24 (H)  (H): Data is abnormally high   Latest Reference Range & Units 07/23/24 11:31  TSH 0.350 - 4.500 uIU/mL 14.000 (H)  (H): Data is abnormally high  RADIOGRAPHIC STUDIES: I have personally reviewed the radiological images as listed and agreed with the findings in the report.   DG Chest 2 View CLINICAL DATA:  Shortness of breath and wheezing.  Lung cancer.  EXAM: CHEST - 2 VIEW  COMPARISON:  02/25/2021 and CT chest 03/12/2024.  FINDINGS: Trachea is midline. Heart is at the upper limits of normal in size. Thoracic aorta is calcified. Spiculated right upper lobe nodule with surrounding architectural distortion. Known medial left lower lobe nodule is obscured. Peripheral septal thickening in the lower right hemithorax. Lungs are emphysematous otherwise clear.  IMPRESSION: 1. Suspect mild pulmonary edema. 2. Right upper lobe nodule, compatible with the provided history of lung cancer. Known medial left lower lobe nodule is better seen on CT chest 03/12/2024. 3.  Emphysema  (ICD10-J43.9).  Electronically Signed   By: Newell Eke M.D.   On: 07/16/2024 10:56

## 2024-07-23 NOTE — Progress Notes (Signed)
 Per treatment team to hold treatment today. All follow ups as scheduled.   [name]

## 2024-07-24 ENCOUNTER — Encounter: Payer: Self-pay | Admitting: Oncology

## 2024-07-24 ENCOUNTER — Other Ambulatory Visit: Payer: Self-pay | Admitting: *Deleted

## 2024-07-24 LAB — T4: T4, Total: 6.8 ug/dL (ref 4.5–12.0)

## 2024-07-24 MED ORDER — FUROSEMIDE 40 MG PO TABS: 40.0000 mg | ORAL_TABLET | Freq: Every day | ORAL | 2 refills | Status: DC | PRN

## 2024-07-28 ENCOUNTER — Other Ambulatory Visit: Payer: Self-pay | Admitting: Oncology

## 2024-08-06 ENCOUNTER — Encounter: Payer: Self-pay | Admitting: *Deleted

## 2024-08-06 ENCOUNTER — Ambulatory Visit (INDEPENDENT_AMBULATORY_CARE_PROVIDER_SITE_OTHER)

## 2024-08-06 ENCOUNTER — Other Ambulatory Visit: Payer: Self-pay | Admitting: Oncology

## 2024-08-06 ENCOUNTER — Inpatient Hospital Stay: Attending: Hematology

## 2024-08-06 ENCOUNTER — Ambulatory Visit (HOSPITAL_COMMUNITY)
Admission: RE | Admit: 2024-08-06 | Discharge: 2024-08-06 | Disposition: A | Discharge status: Home / Self Care | Source: Ambulatory Visit | Attending: Hematology | Admitting: Hematology

## 2024-08-06 DIAGNOSIS — C3491 Malignant neoplasm of unspecified part of right bronchus or lung: Secondary | ICD-10-CM | POA: Insufficient documentation

## 2024-08-06 DIAGNOSIS — I509 Heart failure, unspecified: Secondary | ICD-10-CM | POA: Diagnosis not present

## 2024-08-06 DIAGNOSIS — Z79899 Other long term (current) drug therapy: Secondary | ICD-10-CM

## 2024-08-06 LAB — ECHOCARDIOGRAM COMPLETE
AR max vel: 2.56 cm2
AV Peak grad: 3.1 mmHg
Ao pk vel: 0.88 m/s
Area-P 1/2: 4.19 cm2
Calc EF: 19.6 %
Est EF: <20
MV M vel: 4.93 m/s
MV Peak grad: 97.2 mmHg
S' Lateral: 5.1 cm
Single Plane A2C EF: 20.9 %
Single Plane A4C EF: 17.8 %

## 2024-08-06 LAB — CORTISOL: Cortisol, Plasma: 18.3 ug/dL

## 2024-08-06 MED ORDER — IOHEXOL 300 MG/ML  SOLN
100.0000 mL | Freq: Once | INTRAMUSCULAR | Status: AC | PRN
  Administered 2024-08-06: 100 mL via INTRAVENOUS

## 2024-08-06 NOTE — Progress Notes (Signed)
 Patient came to clinic for assessment. Very congested with brown phlem. Very sob when he lays down at night. I did a walk with him to check O2 and does not meet criteria for oxygen . 96% sitting and while ambulating. Denies fever. Patient is for CT CAP and will be read STAT.  Dr. Davonna aware and will review results for acute process.

## 2024-08-07 ENCOUNTER — Encounter: Payer: Self-pay | Admitting: *Deleted

## 2024-08-07 ENCOUNTER — Other Ambulatory Visit: Payer: Self-pay | Admitting: *Deleted

## 2024-08-07 LAB — ACTH: C206 ACTH: 60.5 pg/mL (ref 7.2–63.3)

## 2024-08-07 MED ORDER — METHYLPREDNISOLONE 4 MG PO TBPK: ORAL_TABLET | ORAL | 0 refills | Status: DC

## 2024-08-07 NOTE — Progress Notes (Signed)
 Attempted to contact patient multiple times to discuss that Echo showed severe heart failure. This is the reason for his respiratory issues that he spoke to me about yesterday. May be as a result of immunotherapy, per Dr. Davonna. She would like for him to continue taking lasix as prescribed. We have made an URGENT referral to Cardiology for him to get established. He had inquired about taking prednisone  when I spoke to him yesterday.  Dr. Davonna does not want to order this at the moment, as it may worsen his condition.  Will continue to reach out.

## 2024-08-08 ENCOUNTER — Other Ambulatory Visit: Payer: Self-pay

## 2024-08-12 ENCOUNTER — Inpatient Hospital Stay

## 2024-08-12 ENCOUNTER — Inpatient Hospital Stay: Admitting: Oncology

## 2024-08-12 VITALS — BP 123/82 | HR 117 | Temp 97.8°F | Resp 18 | Wt 161.0 lb

## 2024-08-12 DIAGNOSIS — E039 Hypothyroidism, unspecified: Secondary | ICD-10-CM

## 2024-08-12 DIAGNOSIS — C3491 Malignant neoplasm of unspecified part of right bronchus or lung: Secondary | ICD-10-CM | POA: Diagnosis not present

## 2024-08-12 DIAGNOSIS — I5020 Unspecified systolic (congestive) heart failure: Secondary | ICD-10-CM | POA: Diagnosis not present

## 2024-08-12 NOTE — Progress Notes (Signed)
 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 male with metastatic adenocarcinoma of lung  Metastatic non-small cell lung carcinoma-adenocarcinoma History of metastatic non-small cell lung carcinoma-metastatic to brain and lymph nodes diagnosed in 2020.  Has been on single agent pembrolizumab  since then. Extensive oncology history below Recurrence in axilla lymphadenopathy that is treated with radiation. Last CT scan with stable disease.  - We reviewed the recent CT scan findings together.  There are 2 areas in the lung that are concerning for recurrence of disease.  Will repeat a PET scan in 2 months. 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  - Continue levothyroxine  100 mcg daily.  Probable adrenal insufficiency Labs consistent with low sodium, elevated potassium and elevated TSH pointing towards adrenal insufficiency.  Likely immunotherapy induced. Normal ACTH  and cortisol.  Adrenal insufficiency ruled out.  Pedal edema Patient had elevated proBNP Recent echo with less than 20% LVEF  -Referred to cardiology - Increase Lasix   to 40 mg twice daily.   The patient understands the plans discussed today and is in agreement with them.  He knows to contact our office if he develops concerns prior to his next appointment.  20 minutes of total time was spent for this patient encounter, including preparation,review of records,  face-to-face counseling with the patient and coordination of care, physical exam, and documentation of the encounter.    LILLETTE Verneta SAUNDERS Teague,acting as a neurosurgeon for [name], MD.,have documented all relevant documentation on the behalf of [name], MD,as directed by  [name], MD while in the presence of [name], MD.  I, [name] MD, have reviewed the above documentation for accuracy and completeness, and I agree with the above.    [name], MD  [facility] [place] - A DEPT OF [facility] [address] [place] [address] Dept: [phone] Dept Fax: [phone]   Orders Placed This Encounter  Procedures   NM PET Image Restag (PS) Skull Base To Thigh    Standing Status:   Future    Expected Date:   10/06/2024    Expiration Date:   08/12/2025    If indicated for the ordered procedure, I authorize the administration of a radiopharmaceutical per Radiology protocol:   Yes    Preferred imaging location?:   [facility]     ONCOLOGY HISTORY:   I have reviewed his chart and materials related to his cancer extensively and collaborated history with the patient. 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  -05/09/2019: Left supraclavicular lymph node biopsy. Pathology: Metastatic lung adenocarcinoma, poorly differentiated. NGS: Negative for ALK, ROS1, RET, BRAF V600, and EGFR. PD-L1 TPS >95%. -05/21/2019: PET scan:Within  the right upper lobe there is a markedly hypermetabolic spiculated 4.4 x 3.0 x 2.0 cm mass. Finding is compatible with the known biopsy-proven malignancy. Left level 5B and bilateral supraclavicular hypermetabolic adenopathy. Left axillary hypermetabolic adenopathy. Mediastinal and right hilar and infrahilar hypermetabolic adenopathy. Bilateral retrocrural and retroperitoneal hypermetabolic adenopathy. Previously identified large pericardial effusion has resolved. There is a focus of increased metabolic activity in the intercostal space between the left anterior 5th and 6th ribs likely reflecting the recent pericardial window approach.  -05/21/2019: MRI brain: 3 ring-enhancing lesions involving the left cerebral hemisphere, right pons and right cerebellum. -05/2019-current: Single agent Keytruda  200 mg every 3 weeks, and transitioned to 400mg  every 6 weeks  -12/24/2020: MRI Brain: NED -12/24/2020: CT CAP: Previously noted right upper lobe mass is stable in size compared to the prior study, likely to represent a treated lesion, with some surrounding postradiation changes. No definitive findings to suggest new metastatic disease elsewhere in the chest, abdomen or pelvis. Small 3-4 mm pulmonary nodules in the lungs bilaterally, nonspecific, but similar to the prior study and statistically likely to be benign. -06/21/2022: CT chest:  Irregular solid 1.4 cm posteromedial left lower lobe pulmonary nodule, slightly increased, suspicious for malignant nodule, either a metachronous primary bronchogenic malignancy versus a contralateral pulmonary metastasis. Otherwise stable chest CT. Spiculated solid 3.4 cm right upper lobe lung mass is stable. Additional tiny bilateral pulmonary nodules are stable. Mild left axillary and left retropectoral adenopathy is stable, cannot exclude metastatic adenopathy. -08/03/2022: PET:  Hypermetabolic spiculated solid 3.3 cm right upper lobe lung mass, compatible with viable primary  bronchogenic carcinoma. Solid irregular 1.3 cm medial left lower lobe pulmonary nodule with low level FDG uptake, suspicious for contralateral pulmonary metastasis. Hypermetabolic left retropectoral and left axillary nodal metastases. Solitary hypermetabolic left level II neck lymph node, compatible with nodal metastasis. No hypermetabolic metastatic disease in the abdomen, pelvis or skeleton. -08/31/2022: Left axillary lymph node biopsy.  Pathology: Metastatic poorly differentiated carcinoma to a lymph node, consistent with patient's clinical history of primary lung adenocarcinoma. -10/06/2022: CarisNGS: PD-L1 TPS 100%, no other targetable mutations. TMB-low. MSI-stable. CDKN2A pathogenic variant.  -05/08/2023-05/28/2023: Radiation therapy to left axillary lymph node -09/27/2023: PET: Interval complete or near complete clearing of the previous left axillary and subpectoral adenopathy, with only hazy stranding in this vicinity currently. The dominant right upper lobe nodule is similar in size and activity to the prior exam. The superior segment left lower lobe nodule is similar in size and activity to the prior exam. -12/17/2023: CT chest:  Interval increase in the size of the spiculated nodule in the left lower lobe. Interval development of a 4 mm area of architectural nodularity in the posterior left upper lobe along the fissure. Interval decrease in the size of the spiculated dominant nodule in the right upper lobe. Resolution of the previously seen left axillary adenopathy. T8 compression fracture with approximately 50% loss of vertebral body height, new since the prior CT, and concerning for a pathologic fracture. -03/12/2024: CT chest: Stable disease.  -08/06/2024: CT CAP: Stable to minimally enlarged spiculated right upper lobe nodule, compatible with primary bronchogenic carcinoma. Stable spiculated left lower lobe nodule, worrisome for a synchronous bronchogenic carcinoma. No evidence of distant  metastatic disease. Mild congestive heart  failure.  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 We discussed echo results regarding his poor heart function. He notes bilateral lower extremity edema below the knees and is taking lasix  40 mg daily. He would like to increase this dosage and I will prescribe 40mg  twice daily. Josph also notes dyspnea, likely due to heart failure. He reports he has a history of pericardial effusion and is s/p window with resolve of fluid and associated symptoms. I have referred him to cardiology for management.   He is taking Synthroid  as prescribed.    I have reviewed the past medical history, past surgical history, social history and family history with the patient and they are unchanged from previous note.  ALLERGIES:  has no known allergies.  MEDICATIONS:  No current facility-administered medications for this visit.   No current outpatient medications on file.   Facility-Administered Medications Ordered in Other Visits  Medication Dose Route Frequency Provider Last Rate Last Admin   acetaminophen  (TYLENOL ) tablet 650 mg  650 mg Oral Q6H PRN End, Lonni, MD   650 mg at 08/22/24 1504   Or   acetaminophen  (TYLENOL ) suppository 650 mg  650 mg Rectal Q6H PRN End, Lonni, MD       arformoterol  (BROVANA ) nebulizer solution 15 mcg  15 mcg Nebulization BID End, Lonni, MD   15 mcg at 08/24/24 2005   budesonide  (PULMICORT ) nebulizer solution 0.25 mg  0.25 mg Nebulization BID End, Lonni, MD   0.25 mg at 08/24/24 2005   Chlorhexidine  Gluconate Cloth 2 % PADS 6 each  6 each Topical Daily Patsy Lenis, MD   6 each at 08/24/24 0840   digoxin  (LANOXIN ) tablet 0.125 mg  0.125 mg Oral Daily Bensimhon, Daniel R, MD   0.125 mg at 08/24/24 9160   enoxaparin  (LOVENOX ) injection 40 mg  40 mg Subcutaneous Q24H End, Christopher, MD   40 mg at 08/24/24 2120   [START ON 08/25/2024]  furosemide  (LASIX ) tablet 40 mg  40 mg Oral Daily Tolia, Sunit, DO       ipratropium-albuterol  (DUONEB) 0.5-2.5 (3) MG/3ML nebulizer solution 3 mL  3 mL Nebulization TID Alvan Dorn FALCON, MD   3 mL at 08/24/24 2005   ipratropium-albuterol  (DUONEB) 0.5-2.5 (3) MG/3ML nebulizer solution 3 mL  3 mL Nebulization Q4H PRN Alvan Dorn FALCON, MD       levothyroxine  (SYNTHROID ) tablet 100 mcg  100 mcg Oral Q0600 End, Christopher, MD   100 mcg at 08/24/24 0631   losartan  (COZAAR ) tablet 25 mg  25 mg Oral Daily Diaz, Alma L, NP   25 mg at 08/24/24 9160   ondansetron  (ZOFRAN ) tablet 4 mg  4 mg Oral Q6H PRN End, Lonni, MD       Or   ondansetron  (ZOFRAN ) injection 4 mg  4 mg Intravenous Q6H PRN End, Lonni, MD       NOREEN ON 08/25/2024] revefenacin  (YUPELRI ) nebulizer solution 175 mcg  175 mcg Nebulization Daily Alvan Dorn FALCON, MD       rosuvastatin  (CRESTOR ) tablet 20 mg  20 mg Oral Daily Gretel Prentice BIRCH, RPH   20 mg at 08/24/24 9160   sodium chloride  flush (NS) 0.9 % injection 10-40 mL  10-40 mL Intracatheter Q12H Patsy Lenis, MD   10 mL at 08/24/24 2120   sodium chloride  flush (NS) 0.9 % injection 10-40 mL  10-40 mL Intracatheter PRN Patsy Lenis, MD  sodium chloride  flush (NS) 0.9 % injection 3 mL  3 mL Intravenous Q12H End, Christopher, MD   3 mL at 08/24/24 2120   sodium chloride  flush (NS) 0.9 % injection 3 mL  3 mL Intravenous PRN End, Lonni, MD       spironolactone  (ALDACTONE ) tablet 12.5 mg  12.5 mg Oral Daily Hayes Lander L, NP   12.5 mg at 08/24/24 9160    REVIEW OF SYSTEMS:   Constitutional: Denies fevers, chills or abnormal weight loss Eyes: Denies blurriness of vision Ears, nose, mouth, throat, and face: Denies mucositis or sore throat Cardiovascular: Denies palpitation Gastrointestinal:  Denies nausea, heartburn or change in bowel habits Skin: Denies abnormal skin rashes Lymphatics: Denies new lymphadenopathy or easy bruising Neurological:Denies numbness,  tingling  Behavioral/Psych: Mood is stable, no new changes   All other systems were reviewed with the patient and are negative.   VITALS:  Blood pressure 123/82, pulse (!) 117, temperature 97.8 F (36.6 C), temperature source Tympanic, resp. rate 18, weight 161 lb (73 kg), SpO2 96%.  Wt Readings from Last 3 Encounters:  08/24/24 139 lb 12.4 oz (63.4 kg)  08/12/24 161 lb (73 kg)  07/23/24 162 lb (73.5 kg)    Body mass index is 23.78 kg/m.  Performance status (ECOG): 1 - Symptomatic but completely ambulatory  PHYSICAL EXAM:   GENERAL:alert, no distress and comfortable SKIN: skin color, texture, turgor are normal, no rashes or significant lesions LYMPH:  no palpable lymphadenopathy in the cervical, axillary or inguinal LUNGS: Decreased breath sounds bilaterally.  No rales or wheezes heard. HEART: regular rate & rhythm and no murmurs , bilateral pitting pedal edema 1+ (improved from prior. ABDOMEN:abdomen soft, non-tender and normal bowel sounds Musculoskeletal:no cyanosis of digits and no clubbing  NEURO: alert & oriented x 3 with fluent speech  LABORATORY DATA:  I have reviewed the data as listed   Latest Reference Range & Units 07/16/24 10:25 07/16/24 13:20  Pro Brain Natriuretic Peptide <300.0 pg/mL 6,828.0 (H)   Troponin T High Sensitivity 0 - 19 ng/L 28 (H) 24 (H)  (H): Data is abnormally high   Latest Reference Range & Units 07/23/24 11:31  TSH 0.350 - 4.500 uIU/mL 14.000 (H)  (H): Data is abnormally high  Latest Reference Range & Units 08/06/24 13:09  C206 ACTH  7.2 - 63.3 pg/mL 60.5  Cortisol, Plasma ug/dL 81.6    RADIOGRAPHIC STUDIES: I have personally reviewed the radiological report as below   ECHOCARDIOGRAM REPORT       Patient Name:   LAMAR DELENA RIBAS Date of Exam: 08/06/2024  Medical Rec #:  [id number]     Height:       69.0 in  Accession #:    [id number]    Weight:       162.0 lb  Date of Birth:  03-Nov-1955      BSA:          1.889 m  Patient Age:    68  years      BP:           124/84 mmHg  Patient Gender: M             HR:           104 bpm.  Exam Location:  [facility]   Procedure: 2D Echo, 3D Echo, Cardiac Doppler, Color Doppler and Strain  Analysis            (Both Spectral and Color Flow Doppler were utilized during  procedure).   Indications:    I50.9* Heart failure (unspecified)    History:        Patient has no prior history of Echocardiogram  examinations.                 CHF, Lung Cancer; Risk Factors:Former Smoker.    Sonographer:    Bascom Burows RCS, RVS  Referring Phys: [id number] Jaslene Marsteller   IMPRESSIONS     1. Left ventricular ejection fraction, by estimation, is <20%. Left  ventricular ejection fraction by 3D volume is 21 %. The left ventricle has  severely decreased function. The left ventricle demonstrates global  hypokinesis. Left ventricular diastolic  parameters are consistent with Grade I diastolic dysfunction (impaired  relaxation). Elevated left ventricular end-diastolic pressure. There is  the interventricular septum is flattened in systole and diastole,  consistent with right ventricular pressure  and volume overload. The average left ventricular global longitudinal  strain is -3.1 %. The global longitudinal strain is abnormal.   2. Right ventricular systolic function is severely reduced. The right  ventricular size is moderately enlarged. There is severely elevated  pulmonary artery systolic pressure. The estimated right ventricular  systolic pressure is 61.7 mmHg.   3. Left atrial size was mild to moderately dilated.   4. Right atrial size was severely dilated.   5. The mitral valve is normal in structure. Mild mitral valve  regurgitation. No evidence of mitral stenosis.   6. The tricuspid valve is abnormal. Tricuspid valve regurgitation is  moderate to severe.   7. The aortic valve is tricuspid. Aortic valve regurgitation is not  visualized. No aortic stenosis is present.   8. The  inferior vena cava is normal in size with greater than 50%  respiratory variability, suggesting right atrial pressure of 3 mmHg.   Comparison(s): No prior Echocardiogram.   FINDINGS   Left Ventricle: Left ventricular ejection fraction, by estimation, is  <20%. Left ventricular ejection fraction by 3D volume is 21 %. The left  ventricle has severely decreased function. The left ventricle demonstrates  global hypokinesis. The average left   ventricular global longitudinal strain is -3.1 %. Strain was performed  and the global longitudinal strain is abnormal. The left ventricular  internal cavity size was normal in size. There is no left ventricular  hypertrophy. The interventricular septum is   flattened in systole and diastole, consistent with right ventricular  pressure and volume overload. Left ventricular diastolic parameters are  consistent with Grade I diastolic dysfunction (impaired relaxation).  Elevated left ventricular end-diastolic  pressure.   Right Ventricle: The right ventricular size is moderately enlarged. No  increase in right ventricular wall thickness. Right ventricular systolic  function is severely reduced. There is severely elevated pulmonary artery  systolic pressure. The tricuspid  regurgitant velocity is 3.83 m/s, and with an assumed right atrial  pressure of 3 mmHg, the estimated right ventricular systolic pressure is  61.7 mmHg.   Left Atrium: Left atrial size was mild to moderately dilated.   Right Atrium: Right atrial size was severely dilated.   Pericardium: There is no evidence of pericardial effusion.   Mitral Valve: The mitral valve is normal in structure. Mild mitral valve  regurgitation. No evidence of mitral valve stenosis. MV peak gradient, 4.1  mmHg. The mean mitral valve gradient is 2.0 mmHg.   Tricuspid Valve: The tricuspid valve is abnormal. Tricuspid valve  regurgitation is moderate to severe. No evidence of tricuspid stenosis.   Aortic  Valve: The  aortic valve is tricuspid. Aortic valve regurgitation is  not visualized. No aortic stenosis is present. Aortic valve peak gradient  measures 3.1 mmHg.   Pulmonic Valve: The pulmonic valve was normal in structure. Pulmonic valve  regurgitation is mild. No evidence of pulmonic stenosis.   Aorta: The aortic root and ascending aorta are structurally normal, with  no evidence of dilitation.   Venous: The inferior vena cava is normal in size with greater than 50%  respiratory variability, suggesting right atrial pressure of 3 mmHg.   IAS/Shunts: No atrial level shunt detected by color flow Doppler.   Additional Comments: 3D was performed not requiring image post processing  on an independent workstation and was abnormal.     LEFT VENTRICLE  PLAX 2D  LVIDd:         5.40 cm         Diastology  LVIDs:         5.10 cm         LV e' medial:    2.94 cm/s  LV PW:         1.20 cm         LV E/e' medial:  29.6  LV IVS:        0.90 cm         LV e' lateral:   4.79 cm/s  LVOT diam:     2.30 cm         LV E/e' lateral: 18.2  LVOT Area:     4.15 cm                                 2D Longitudinal                                 Strain  LV Volumes (MOD)               2D Strain GLS   -4.5 %  LV vol d, MOD    139.0 ml      (A4C):  A2C:                           2D Strain GLS   -2.1 %  LV vol d, MOD    120.0 ml      (A3C):  A4C:                           2D Strain GLS   -2.6 %  LV vol s, MOD    110.0 ml      (A2C):  A2C:                           2D Strain GLS   -3.1 %  LV vol s, MOD    98.6 ml       Avg:  A4C:  LV SV MOD A2C:   29.0 ml       3D Volume EF  LV SV MOD A4C:   120.0 ml      LV 3D EF:    Left  LV SV MOD BP:    25.2 ml                    ventricul  ar                                              ejection                                              fraction                                              by 3D                                               volume is                                              21 %.                                   3D Volume EF:                                 3D EF:        21 %                                 LV EDV:       221 ml                                 LV ESV:       175 ml                                 LV SV:        46 ml   RIGHT VENTRICLE            IVC  RV Basal diam:  5.20 cm    IVC diam: 1.80 cm  RV Mid diam:    3.20 cm  RV S prime:     8.05 cm/s  PULMONARY VEINS  TAPSE (M-mode): 0.9 cm     Diastolic Velocity: 52.45 cm/s                             S/D Velocity:       1.00                             Systolic Velocity:  53.75 cm/s   LEFT ATRIUM             Index  RIGHT ATRIUM           Index  LA diam:        4.50 cm 2.38 cm/m   RA Area:     22.40 cm  LA Vol (A2C):   73.6 ml 38.96 ml/m  RA Volume:   79.70 ml  42.19 ml/m  LA Vol (A4C):   74.4 ml 39.39 ml/m  LA Biplane Vol: 74.6 ml 39.49 ml/m   AORTIC VALVE                PULMONIC VALVE  AV Area (Vmax): 2.56 cm    PV Vmax:          0.89 m/s  AV Vmax:        88.40 cm/s  PV Peak grad:     3.2 mmHg  AV Peak Grad:   3.1 mmHg    PR End Diast Vel: 13.03 msec  LVOT Vmax:      54.40 cm/s    AORTA  Ao Root diam: 3.20 cm  Ao Asc diam:  3.20 cm   MITRAL VALVE               TRICUSPID VALVE  MV Area (PHT): 4.19 cm    TR Peak grad:   58.7 mmHg  MV Peak grad:  4.1 mmHg    TR Vmax:        383.00 cm/s  MV Mean grad:  2.0 mmHg  MV Vmax:       1.01 m/s    SHUNTS  MV Vmean:      73.4 cm/s   Systemic Diam: 2.30 cm  MV Decel Time: 181 msec  MR Peak grad: 97.2 mmHg  MR Mean grad: 61.0 mmHg  MR Vmax:      493.00 cm/s  MR Vmean:     364.0 cm/s  MV E velocity: 87.00 cm/s  MV A velocity: 59.10 cm/s  MV E/A ratio:  1.47   Vishnu Priya Mallipeddi  Electronically signed by Diannah Late Mallipeddi  Signature Date/Time: 08/06/2024/4:38:51 PM

## 2024-08-12 NOTE — Progress Notes (Signed)
 Message received from A.Lenon PEAK. Treatment held indefinitely.

## 2024-08-12 NOTE — Patient Instructions (Signed)
 [facility] at [facility] Discharge Instructions   You were seen and examined today by Dr. Davonna.  She reviewed the results of your lab work which are normal/stable.   We will hold your treatment.   Return as scheduled.    Thank you for choosing [facility] at [facility] to provide your oncology and hematology care.  To afford each patient quality time with our provider, please arrive at least 15 minutes before your scheduled appointment time.   If you have a lab appointment with the Cancer Center please come in thru the Main Entrance and check in at the main information desk.  You need to re-schedule your appointment should you arrive 10 or more minutes late.  We strive to give you quality time with our providers, and arriving late affects you and other patients whose appointments are after yours.  Also, if you no show three or more times for appointments you may be dismissed from the clinic at the providers discretion.     Again, thank you for choosing [facility].  Our hope is that these requests will decrease the amount of time that you wait before being seen by our physicians.       _____________________________________________________________  Should you have questions after your visit to [facility], please contact our office at [phone] and follow the prompts.  Our office hours are 8:00 a.m. and 4:30 p.m. Monday - Friday.  Please note that voicemails left after 4:00 p.m. may not be returned until the following business day.  We are closed weekends and major holidays.  You do have access to a nurse 24-7, just call the main number to the clinic [phone] and do not press any options, hold on the line and a nurse will answer the phone.    For prescription refill requests, have your pharmacy contact our office and allow 72 hours.    Due to Covid, you will need to wear a mask upon entering the hospital. If  you do not have a mask, a mask will be given to you at the Main Entrance upon arrival. For doctor visits, patients may have 1 support person age 68 or older with them. For treatment visits, patients can not have anyone with them due to social distancing guidelines and our immunocompromised population.

## 2024-08-13 ENCOUNTER — Other Ambulatory Visit: Payer: Self-pay

## 2024-08-15 MED ORDER — FUROSEMIDE 40 MG PO TABS: 40.0000 mg | ORAL_TABLET | Freq: Two times a day (BID) | ORAL | 0 refills | Status: DC

## 2024-08-17 ENCOUNTER — Inpatient Hospital Stay (HOSPITAL_COMMUNITY)
Admission: EM | Admit: 2024-08-17 | Discharge: 2024-08-26 | DRG: 286 | Disposition: A | Discharge status: Home / Self Care | Attending: Internal Medicine | Admitting: Internal Medicine

## 2024-08-17 ENCOUNTER — Emergency Department (HOSPITAL_COMMUNITY)

## 2024-08-17 ENCOUNTER — Other Ambulatory Visit: Payer: Self-pay

## 2024-08-17 ENCOUNTER — Encounter (HOSPITAL_COMMUNITY): Payer: Self-pay

## 2024-08-17 DIAGNOSIS — I5021 Acute systolic (congestive) heart failure: Secondary | ICD-10-CM | POA: Diagnosis not present

## 2024-08-17 DIAGNOSIS — R7989 Other specified abnormal findings of blood chemistry: Secondary | ICD-10-CM | POA: Diagnosis not present

## 2024-08-17 DIAGNOSIS — I5082 Biventricular heart failure: Secondary | ICD-10-CM | POA: Diagnosis present

## 2024-08-17 DIAGNOSIS — Z9221 Personal history of antineoplastic chemotherapy: Secondary | ICD-10-CM

## 2024-08-17 DIAGNOSIS — E2740 Unspecified adrenocortical insufficiency: Secondary | ICD-10-CM | POA: Diagnosis present

## 2024-08-17 DIAGNOSIS — N179 Acute kidney failure, unspecified: Secondary | ICD-10-CM | POA: Diagnosis not present

## 2024-08-17 DIAGNOSIS — Z720 Tobacco use: Secondary | ICD-10-CM

## 2024-08-17 DIAGNOSIS — E039 Hypothyroidism, unspecified: Secondary | ICD-10-CM | POA: Diagnosis present

## 2024-08-17 DIAGNOSIS — Z79899 Other long term (current) drug therapy: Secondary | ICD-10-CM

## 2024-08-17 DIAGNOSIS — I071 Rheumatic tricuspid insufficiency: Secondary | ICD-10-CM | POA: Diagnosis present

## 2024-08-17 DIAGNOSIS — J438 Other emphysema: Secondary | ICD-10-CM | POA: Diagnosis present

## 2024-08-17 DIAGNOSIS — I428 Other cardiomyopathies: Secondary | ICD-10-CM | POA: Diagnosis present

## 2024-08-17 DIAGNOSIS — I2489 Other forms of acute ischemic heart disease: Secondary | ICD-10-CM | POA: Diagnosis present

## 2024-08-17 DIAGNOSIS — Z79890 Hormone replacement therapy (postmenopausal): Secondary | ICD-10-CM | POA: Diagnosis not present

## 2024-08-17 DIAGNOSIS — I5043 Acute on chronic combined systolic (congestive) and diastolic (congestive) heart failure: Secondary | ICD-10-CM | POA: Diagnosis not present

## 2024-08-17 DIAGNOSIS — E871 Hypo-osmolality and hyponatremia: Secondary | ICD-10-CM | POA: Diagnosis present

## 2024-08-17 DIAGNOSIS — J432 Centrilobular emphysema: Secondary | ICD-10-CM | POA: Diagnosis present

## 2024-08-17 DIAGNOSIS — I10 Essential (primary) hypertension: Secondary | ICD-10-CM | POA: Insufficient documentation

## 2024-08-17 DIAGNOSIS — C7931 Secondary malignant neoplasm of brain: Secondary | ICD-10-CM | POA: Diagnosis present

## 2024-08-17 DIAGNOSIS — D630 Anemia in neoplastic disease: Secondary | ICD-10-CM | POA: Diagnosis present

## 2024-08-17 DIAGNOSIS — I5023 Acute on chronic systolic (congestive) heart failure: Secondary | ICD-10-CM | POA: Diagnosis present

## 2024-08-17 DIAGNOSIS — R000 Tachycardia, unspecified: Secondary | ICD-10-CM | POA: Diagnosis present

## 2024-08-17 DIAGNOSIS — I272 Pulmonary hypertension, unspecified: Secondary | ICD-10-CM | POA: Diagnosis present

## 2024-08-17 DIAGNOSIS — I509 Heart failure, unspecified: Principal | ICD-10-CM

## 2024-08-17 DIAGNOSIS — C3491 Malignant neoplasm of unspecified part of right bronchus or lung: Secondary | ICD-10-CM | POA: Diagnosis present

## 2024-08-17 DIAGNOSIS — I2510 Atherosclerotic heart disease of native coronary artery without angina pectoris: Secondary | ICD-10-CM | POA: Diagnosis present

## 2024-08-17 DIAGNOSIS — I5041 Acute combined systolic (congestive) and diastolic (congestive) heart failure: Principal | ICD-10-CM | POA: Diagnosis present

## 2024-08-17 DIAGNOSIS — I110 Hypertensive heart disease with heart failure: Principal | ICD-10-CM | POA: Diagnosis present

## 2024-08-17 DIAGNOSIS — J449 Chronic obstructive pulmonary disease, unspecified: Secondary | ICD-10-CM | POA: Diagnosis not present

## 2024-08-17 DIAGNOSIS — I50811 Acute right heart failure: Secondary | ICD-10-CM | POA: Diagnosis not present

## 2024-08-17 HISTORY — DX: Heart failure, unspecified: I50.9

## 2024-08-17 HISTORY — DX: Hypothyroidism, unspecified: E03.9

## 2024-08-17 HISTORY — DX: Essential (primary) hypertension: I10

## 2024-08-17 HISTORY — DX: Chronic obstructive pulmonary disease, unspecified: J44.9

## 2024-08-17 HISTORY — DX: Dyspnea, unspecified: R06.00

## 2024-08-17 LAB — CBC WITH DIFFERENTIAL/PLATELET
Abs Immature Granulocytes: 0.01 K/uL (ref 0.00–0.07)
Basophils Absolute: 0 K/uL (ref 0.0–0.1)
Basophils Relative: 1 %
Eosinophils Absolute: 0.1 K/uL (ref 0.0–0.5)
Eosinophils Relative: 3 %
HCT: 37.4 % — ABNORMAL LOW (ref 39.0–52.0)
Hemoglobin: 12.1 g/dL — ABNORMAL LOW (ref 13.0–17.0)
Immature Granulocytes: 0 %
Lymphocytes Relative: 16 %
Lymphs Abs: 0.7 K/uL (ref 0.7–4.0)
MCH: 32.1 pg (ref 26.0–34.0)
MCHC: 32.4 g/dL (ref 30.0–36.0)
MCV: 99.2 fL (ref 80.0–100.0)
Monocytes Absolute: 0.6 K/uL (ref 0.1–1.0)
Monocytes Relative: 13 %
Neutro Abs: 3 K/uL (ref 1.7–7.7)
Neutrophils Relative %: 67 %
Platelets: 277 K/uL (ref 150–400)
RBC: 3.77 MIL/uL — ABNORMAL LOW (ref 4.22–5.81)
RDW: 13.6 % (ref 11.5–15.5)
WBC: 4.5 K/uL (ref 4.0–10.5)
nRBC: 0 % (ref 0.0–0.2)

## 2024-08-17 LAB — BASIC METABOLIC PANEL WITH GFR
Anion gap: 9 (ref 5–15)
BUN: 21 mg/dL (ref 8–23)
CO2: 27 mmol/L (ref 22–32)
Calcium: 8.8 mg/dL — ABNORMAL LOW (ref 8.9–10.3)
Chloride: 99 mmol/L (ref 98–111)
Creatinine, Ser: 0.93 mg/dL (ref 0.61–1.24)
GFR, Estimated: >60 mL/min (ref >60)
Glucose, Bld: 90 mg/dL (ref 70–99)
Potassium: 4.8 mmol/L (ref 3.5–5.1)
Sodium: 134 mmol/L — ABNORMAL LOW (ref 135–145)

## 2024-08-17 LAB — PRO BRAIN NATRIURETIC PEPTIDE: Pro Brain Natriuretic Peptide: 12562 pg/mL — ABNORMAL HIGH (ref <300.0)

## 2024-08-17 LAB — TROPONIN T, HIGH SENSITIVITY
Troponin T High Sensitivity: 41 ng/L — ABNORMAL HIGH (ref 0–19)
Troponin T High Sensitivity: 40 ng/L — ABNORMAL HIGH (ref 0–19)

## 2024-08-17 LAB — MAGNESIUM: Magnesium: 2.6 mg/dL — ABNORMAL HIGH (ref 1.7–2.4)

## 2024-08-17 MED ORDER — FUROSEMIDE 10 MG/ML IJ SOLN
40.0000 mg | Freq: Two times a day (BID) | INTRAMUSCULAR | Status: DC
Start: 2024-08-18 — End: 2024-08-18
  Administered 2024-08-18: 40 mg via INTRAVENOUS
  Filled 2024-08-17: qty 4

## 2024-08-17 MED ORDER — FUROSEMIDE 10 MG/ML IJ SOLN
40.0000 mg | Freq: Once | INTRAMUSCULAR | Status: AC
  Administered 2024-08-17: 40 mg via INTRAVENOUS
  Filled 2024-08-17: qty 4

## 2024-08-17 MED ORDER — ENOXAPARIN SODIUM 40 MG/0.4ML IJ SOSY
40.0000 mg | PREFILLED_SYRINGE | INTRAMUSCULAR | Status: DC
  Administered 2024-08-18 – 2024-08-20 (×3): 40 mg via SUBCUTANEOUS
  Filled 2024-08-17 (×4): qty 0.4

## 2024-08-17 MED ORDER — ACETAMINOPHEN 325 MG PO TABS
650.0000 mg | ORAL_TABLET | Freq: Four times a day (QID) | ORAL | Status: DC | PRN
  Administered 2024-08-22 – 2024-08-25 (×2): 650 mg via ORAL
  Filled 2024-08-17 (×2): qty 2

## 2024-08-17 MED ORDER — ACETAMINOPHEN 650 MG RE SUPP: 650.0000 mg | Freq: Four times a day (QID) | RECTAL | Status: DC | PRN

## 2024-08-17 MED ORDER — ONDANSETRON HCL 4 MG/2ML IJ SOLN: 4.0000 mg | Freq: Four times a day (QID) | INTRAMUSCULAR | Status: DC | PRN

## 2024-08-17 MED ORDER — IPRATROPIUM-ALBUTEROL 0.5-2.5 (3) MG/3ML IN SOLN
3.0000 mL | RESPIRATORY_TRACT | Status: DC | PRN
  Administered 2024-08-17 – 2024-08-20 (×5): 3 mL via RESPIRATORY_TRACT
  Filled 2024-08-17 (×5): qty 3

## 2024-08-17 MED ORDER — ONDANSETRON HCL 4 MG PO TABS: 4.0000 mg | ORAL_TABLET | Freq: Four times a day (QID) | ORAL | Status: DC | PRN

## 2024-08-17 NOTE — H&P (Signed)
 History and Physical    Patient: Antonio Payne FMW:[id number] DOB: [birth year] DOA: 08/17/2024 DOS: the patient was seen and examined on 08/17/2024 PCP: Pcp, No  Patient coming from: Home  Chief Complaint:  Chief Complaint  Patient presents with   Leg Swelling   HPI: Antonio Payne is a 68 y.o. male with medical history significant of metastatic lung cancer on Keytruda  (follows with Dr. Davonna), hypothyroidism who presents to the emergency department due to 1 month onset of increasing shortness of breath and leg edema.  He was placed on Lasix with some improvement in breathing, though he continued to have swelling legs.  Chemotherapy has since been held due to patient's lower extremity edema and shortness of breath.  Patient ran out of his lasix since last few days which unfortunately resulted in progression of shortness of breath and leg swelling.  He complained of cough, but denies chest pain, fever, nausea, vomiting.  ED course In the emergency department, patient was tachycardic, BP was 130/24, other vitals are within normal range.  Workup in the ED showed normocytic anemia, normal BMP except for sodium of 134.  Troponin 41 > 40, proBNP 12,562, magnesium 2.6 Chest x-ray showed ill-defined nodular opacity in the right upper lobe, unchanged from 07/16/2024.  Emphysema.  Mild cardiomegaly. IV Lasix 40 mg x 1 was given.  TRH was asked to admit patient   Review of Systems: As mentioned in the history of present illness. All other systems reviewed and are negative. Past Medical History:  Diagnosis Date   Cancer [facility])    lung cancer    Past Surgical History:  Procedure Laterality Date   BACK SURGERY     Social History:  reports that he quit smoking about 4 years ago. His smoking use included cigarettes. He has been exposed to tobacco smoke. He has never used smokeless tobacco. He reports that he does not drink alcohol and does not use drugs.  No Known Allergies  History reviewed. No  pertinent family history.  Prior to Admission medications   Medication Sig Start Date End Date Taking? Authorizing Provider  albuterol  (VENTOLIN  HFA) 108 (90 Base) MCG/ACT inhaler INHALE 2 PUFFS INTO THE LUNGS EVERY 4 HOURS AS NEEDED FOR WHEEZING OR SHORTNESS OF BREATH 08/06/24   Kandala, Hyndavi, MD  furosemide (LASIX) 40 MG tablet Take 1 tablet (40 mg total) by mouth 2 (two) times daily. 08/15/24   Davonna Siad, MD  levothyroxine  (SYNTHROID ) 100 MCG tablet Take 1 tablet (100 mcg total) by mouth daily before breakfast. 07/23/24   Davonna Siad, MD    Physical Exam: Vitals:   08/17/24 1815 08/17/24 1830 08/17/24 1900 08/17/24 1915  BP: (!) 134/99 (!) 124/98 96/70 113/85  Pulse: (!) 108 (!) 101 100 (!) 101  Resp: (!) 25 20 17 20   Temp:      SpO2: 96% 96% 95% 99%  Weight:      Height:       General: . Awake and alert and oriented x3. Not in any acute distress.  HEENT: NCAT.  PERRLA. EOMI. Sclerae anicteric.  Moist mucosal membranes. Neck: Neck supple without lymphadenopathy. No carotid bruits. No masses palpated.  Cardiovascular: Tachycardia.  Regular rate with normal S1-S2 sounds. No murmurs, rubs or gallops auscultated. No JVD.  Respiratory: Clear breath sounds.  No accessory muscle use. Abdomen: Soft, nontender, nondistended. Active bowel sounds. No masses or hepatosplenomegaly  Skin: No rashes, lesions, or ulcerations.  Dry, warm to touch. Musculoskeletal: +2 LE edema bilaterally.  2+  dorsalis pedis and radial pulses. Good ROM.  No contractures  Psychiatric: Intact judgment and insight.  Mood appropriate to current condition. Neurologic: No focal neurological deficits. Strength is 5/5 x 4.  CN II - XII grossly intact.  Data Reviewed: EKG personally reviewed shows sinus tachycardia at a rate of 105 bpm  Assessment and Plan: Acute on chronic systolic and diastolic CHF Continue total input/output, daily weights and fluid restriction IV Lasix 40 mg x 1 was given.  Continue IV  Lasix 40 twice daily Continue heart healthy diet  Echocardiogram done on 08/06/2024 showed EF of < 20%.  LV - +GH.  G1 DD.  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  Patient is on pembrolizumab  and has been held due to increasing shortness of breath. He follows with Dr. Davonna  Acquired hypothyroidism Continue Synthroid    Advance Care Planning: Full code  Consults: Cardiology  Family Communication: None at bedside  Severity of Illness: The appropriate patient status for this patient is INPATIENT. Inpatient status is judged to be reasonable and necessary in order to provide the required intensity of service to ensure the patient's safety. The patient's presenting symptoms, physical exam findings, and initial radiographic and laboratory data in the context of their chronic comorbidities is felt to place them at high risk for further clinical deterioration. Furthermore, it is not anticipated that the patient will be medically stable for discharge from the hospital within 2 midnights of admission.   * I certify that at the point of admission it is my clinical judgment that the patient will require inpatient hospital care spanning beyond 2 midnights from the point of admission due to high intensity of service, high risk for further deterioration and high frequency of surveillance required.*  Author: Nandan Willems, DO 08/17/2024 7:36 PM  For on call  review [email or web address].

## 2024-08-17 NOTE — Progress Notes (Signed)
 Patient arrived to this unit and has been admitted to room 331. Pt is alert, verbal oriented x4. Has already ambulated to the toilet to urinate and was very SOB upon returning from the toilet, however his oxygen  saturaiton was 99% on RA and pt denies wearing any oxygen  at home. Audible expiratory wheezing and rhonchi noted.

## 2024-08-17 NOTE — ED Notes (Signed)
 Pt legs weeping from swelling bilaterally.

## 2024-08-17 NOTE — ED Provider Notes (Signed)
 [place] EMERGENCY DEPARTMENT AT [facility] Provider Note   CSN: [id number] Arrival date & time: 08/17/24  1510     Patient presents with: Leg Swelling   Antonio Payne is a 68 y.o. male.  He has a history of metastatic lung cancer and has been on Keytruda .  He has had shortness of breath and edema of his legs for the last month or so.  He was placed on Lasix with some improvement in his symptoms although his legs remained swollen.  He follows with Dr. Jillian oncology but has not received any Keytruda  due to his breathing and edema.  They feel his symptoms have worsened since they have held his chemo.  He also ran out of his Lasix and his symptoms also have progressed.  No fever or chest pain.  Does have cough and shortness of breath.  No vomiting or diarrhea.  Non-smoker.   The history is provided by the patient and the spouse.  Shortness of Breath Severity:  Moderate Onset quality:  Gradual Duration:  1 month Progression:  Worsening Ineffective treatments:  Rest Associated symptoms: cough and wheezing   Associated symptoms: no abdominal pain, no chest pain, no fever and no hemoptysis        Prior to Admission medications   Medication Sig Start Date End Date Taking? Authorizing Provider  albuterol  (VENTOLIN  HFA) 108 (90 Base) MCG/ACT inhaler INHALE 2 PUFFS INTO THE LUNGS EVERY 4 HOURS AS NEEDED FOR WHEEZING OR SHORTNESS OF BREATH 08/06/24   Kandala, Hyndavi, MD  furosemide (LASIX) 40 MG tablet Take 1 tablet (40 mg total) by mouth 2 (two) times daily. 08/15/24   Kandala, Hyndavi, MD  levothyroxine  (SYNTHROID ) 100 MCG tablet Take 1 tablet (100 mcg total) by mouth daily before breakfast. 07/23/24   Davonna Siad, MD    Allergies: Patient has no known allergies.    Review of Systems  Constitutional:  Negative for fever.  Respiratory:  Positive for cough, shortness of breath and wheezing. Negative for hemoptysis.   Cardiovascular:  Negative for chest pain.   Gastrointestinal:  Negative for abdominal pain.    Updated Vital Signs BP (!) 117/92   Pulse 100   Temp 98.3 F (36.8 C)   Resp 16   Ht 5' 9 (1.753 m)   Wt 67.1 kg   SpO2 98%   BMI 21.86 kg/m   Physical Exam Vitals and nursing note reviewed.  Constitutional:      Appearance: Normal appearance. He is well-developed.  HENT:     Head: Normocephalic and atraumatic.  Eyes:     Conjunctiva/sclera: Conjunctivae normal.  Cardiovascular:     Rate and Rhythm: Regular rhythm. Tachycardia present.     Heart sounds: No murmur heard. Pulmonary:     Effort: Respiratory distress present.     Breath sounds: Wheezing and rales present.  Abdominal:     Palpations: Abdomen is soft.     Tenderness: There is no abdominal tenderness.  Musculoskeletal:     Cervical back: Neck supple.     Right lower leg: Edema present.     Left lower leg: Edema present.  Skin:    General: Skin is warm and dry.  Neurological:     General: No focal deficit present.     Mental Status: He is alert.     GCS: GCS eye subscore is 4. GCS verbal subscore is 5. GCS motor subscore is 6.     (all labs ordered are listed, but only abnormal  results are displayed) Labs Reviewed  BASIC METABOLIC PANEL WITH GFR - Abnormal; Notable for the following components:      Result Value   Sodium 134 (*)    Calcium 8.8 (*)    All other components within normal limits  CBC WITH DIFFERENTIAL/PLATELET - Abnormal; Notable for the following components:   RBC 3.77 (*)    Hemoglobin 12.1 (*)    HCT 37.4 (*)    All other components within normal limits  PRO BRAIN NATRIURETIC PEPTIDE - Abnormal; Notable for the following components:   Pro Brain Natriuretic Peptide 12,562.0 (*)    All other components within normal limits  MAGNESIUM - Abnormal; Notable for the following components:   Magnesium 2.6 (*)    All other components within normal limits  COMPREHENSIVE METABOLIC PANEL WITH GFR - Abnormal; Notable for the following  components:   Total Protein 6.4 (*)    AST 48 (*)    ALT 82 (*)    Alkaline Phosphatase 141 (*)    All other components within normal limits  CBC - Abnormal; Notable for the following components:   RBC 3.76 (*)    Hemoglobin 12.2 (*)    HCT 37.9 (*)    MCV 100.8 (*)    All other components within normal limits  MAGNESIUM - Abnormal; Notable for the following components:   Magnesium 2.5 (*)    All other components within normal limits  TROPONIN T, HIGH SENSITIVITY - Abnormal; Notable for the following components:   Troponin T High Sensitivity 41 (*)    All other components within normal limits  TROPONIN T, HIGH SENSITIVITY - Abnormal; Notable for the following components:   Troponin T High Sensitivity 40 (*)    All other components within normal limits  PHOSPHORUS  MISC [facility] TEST (SEND OUT)    EKG: None EKG not crossing in epic.  Sinus tachycardia and nonspecific ST-T's. Radiology: [facility] Chest Port 1 View Result Date: 08/17/2024 EXAM: 1 VIEW(S) XRAY OF THE CHEST 08/17/2024 05:43:00 PM COMPARISON: 07/16/2024 CLINICAL HISTORY: SOB SOB FINDINGS: LUNGS AND PLEURA: Emphysema. Ill defined nodular opacity in the right upper lobe as before. No pleural effusion. No pneumothorax. HEART AND MEDIASTINUM: Aortic atherosclerosis (ICD10-I70.0). Mild cardiomegaly. BONES AND SOFT TISSUES: No acute osseous abnormality. IMPRESSION: 1. Ill-defined nodular opacity in the right upper lobe, unchanged from 07/16/2024, recommend continued imaging follow-up per lung nodule guidelines if not already established. 2. Emphysema. 3. Mild cardiomegaly. Electronically signed by: Katheleen Faes MD 08/17/2024 05:47 PM EST RP Workstation: [id number]     Procedures   Medications Ordered in the ED  enoxaparin (LOVENOX) injection 40 mg (40 mg Subcutaneous Given 08/18/24 0811)  acetaminophen (TYLENOL) tablet 650 mg (has no administration in time range)    Or  acetaminophen (TYLENOL) suppository 650 mg (has no  administration in time range)  ondansetron (ZOFRAN) tablet 4 mg (has no administration in time range)    Or  ondansetron (ZOFRAN) injection 4 mg (has no administration in time range)  furosemide (LASIX) injection 40 mg (40 mg Intravenous Given 08/18/24 0811)  ipratropium-albuterol  (DUONEB) 0.5-2.5 (3) MG/3ML nebulizer solution 3 mL (3 mLs Nebulization Given 08/18/24 0629)  levothyroxine  (SYNTHROID ) tablet 100 mcg (100 mcg Oral Given 08/18/24 0540)  furosemide (LASIX) injection 40 mg (40 mg Intravenous Given 08/17/24 1739)  albuterol  (PROVENTIL ) (2.5 MG/3ML) 0.083% nebulizer solution (2.5 mg  Given 08/18/24 0628)    Clinical Course as of 08/18/24 0923  Sun Aug 17, 2024  1747 Chest x-ray interpreted by  me as cardiomegaly.  Probable interstitial edema.  Awaiting radiology reading. [MB]  1932 With patient and he is agreeable to admission.  Discussed with [facility] hospitalist Dr. Adefeso who will see in consult. [MB]    Clinical Course User Index [MB] Towana Ozell BROCKS, MD                                 Medical Decision Making Amount and/or Complexity of Data Reviewed Labs: ordered. Radiology: ordered.  Risk Prescription drug management. Decision regarding hospitalization.   This patient complains of leg swelling shortness of breath; this involves an extensive number of treatment Options and is a complaint that carries with it a high risk of complications and morbidity. The differential includes CHF, pneumonia, PE, COPD, pneumothorax  I ordered, reviewed and interpreted labs, which included CBC with low hemoglobin, chemistries unremarkable, troponins mildly elevated but flat, BNP significantly elevated I ordered medication IV Lasix and reviewed PMP when indicated. I ordered imaging studies which included chest x-ray and I independently    visualized and interpreted imaging which showed cardiomegaly, right upper lobe mass Additional history obtained from patient's wife Previous  records obtained and reviewed in epic including recent oncology notes and cardiac echo I consulted [facility] hospitalist Dr. Manfred and discussed lab and imaging findings and discussed disposition.  Cardiac monitoring reviewed, sinus rhythm Social determinants considered, no significant barriers Critical Interventions: None  After the interventions stated above, I reevaluated the patient and found patient still to be with increased work of breathing Admission and further testing considered, due to his critically low ejection fraction and fluid overload feel he would benefit from mission to the hospital so cardiology can weigh in and help adjust his medications.  Patient in agreement with plan for admission.      Final diagnoses:  Acute congestive heart failure, unspecified heart failure type [facility])    ED Discharge Orders     None          Towana Ozell BROCKS, MD 08/18/24 [phone]

## 2024-08-17 NOTE — ED Triage Notes (Signed)
 Pt arrives from home with daughter in law who reports he has been seen recently for fluid retention and doctor was supposed to call in fluid pill states it has not been called in yet and continues to swell in his legs and is short of breath.

## 2024-08-18 ENCOUNTER — Encounter (HOSPITAL_COMMUNITY): Payer: Self-pay | Admitting: Internal Medicine

## 2024-08-18 ENCOUNTER — Other Ambulatory Visit (HOSPITAL_COMMUNITY): Payer: Self-pay | Admitting: *Deleted

## 2024-08-18 ENCOUNTER — Inpatient Hospital Stay (HOSPITAL_COMMUNITY)

## 2024-08-18 DIAGNOSIS — I50811 Acute right heart failure: Secondary | ICD-10-CM | POA: Diagnosis not present

## 2024-08-18 DIAGNOSIS — I5021 Acute systolic (congestive) heart failure: Secondary | ICD-10-CM

## 2024-08-18 DIAGNOSIS — I5041 Acute combined systolic (congestive) and diastolic (congestive) heart failure: Secondary | ICD-10-CM | POA: Diagnosis not present

## 2024-08-18 LAB — COMPREHENSIVE METABOLIC PANEL WITH GFR
ALT: 82 U/L — ABNORMAL HIGH (ref 0–44)
AST: 48 U/L — ABNORMAL HIGH (ref 15–41)
Albumin: 4.1 g/dL (ref 3.5–5.0)
Alkaline Phosphatase: 141 U/L — ABNORMAL HIGH (ref 38–126)
Anion gap: 11 (ref 5–15)
BUN: 22 mg/dL (ref 8–23)
CO2: 26 mmol/L (ref 22–32)
Calcium: 9 mg/dL (ref 8.9–10.3)
Chloride: 98 mmol/L (ref 98–111)
Creatinine, Ser: 0.95 mg/dL (ref 0.61–1.24)
GFR, Estimated: >60 mL/min (ref >60)
Glucose, Bld: 94 mg/dL (ref 70–99)
Potassium: 4.5 mmol/L (ref 3.5–5.1)
Sodium: 135 mmol/L (ref 135–145)
Total Bilirubin: 0.4 mg/dL (ref 0.0–1.2)
Total Protein: 6.4 g/dL — ABNORMAL LOW (ref 6.5–8.1)

## 2024-08-18 LAB — CBC
HCT: 37.9 % — ABNORMAL LOW (ref 39.0–52.0)
Hemoglobin: 12.2 g/dL — ABNORMAL LOW (ref 13.0–17.0)
MCH: 32.4 pg (ref 26.0–34.0)
MCHC: 32.2 g/dL (ref 30.0–36.0)
MCV: 100.8 fL — ABNORMAL HIGH (ref 80.0–100.0)
Platelets: 246 K/uL (ref 150–400)
RBC: 3.76 MIL/uL — ABNORMAL LOW (ref 4.22–5.81)
RDW: 13.5 % (ref 11.5–15.5)
WBC: 5 K/uL (ref 4.0–10.5)
nRBC: 0 % (ref 0.0–0.2)

## 2024-08-18 LAB — MAGNESIUM: Magnesium: 2.5 mg/dL — ABNORMAL HIGH (ref 1.7–2.4)

## 2024-08-18 LAB — PHOSPHORUS: Phosphorus: 3.8 mg/dL (ref 2.5–4.6)

## 2024-08-18 MED ORDER — FUROSEMIDE 10 MG/ML IJ SOLN
60.0000 mg | Freq: Two times a day (BID) | INTRAMUSCULAR | Status: AC
  Administered 2024-08-18 – 2024-08-19 (×2): 60 mg via INTRAVENOUS
  Filled 2024-08-18 (×3): qty 6

## 2024-08-18 MED ORDER — LEVOTHYROXINE SODIUM 100 MCG PO TABS
100.0000 ug | ORAL_TABLET | Freq: Every day | ORAL | Status: DC
  Administered 2024-08-18 – 2024-08-26 (×9): 100 ug via ORAL
  Filled 2024-08-18 (×9): qty 1

## 2024-08-18 MED ORDER — FUROSEMIDE 10 MG/ML IJ SOLN
20.0000 mg | Freq: Once | INTRAMUSCULAR | Status: AC
  Administered 2024-08-18: 20 mg via INTRAVENOUS
  Filled 2024-08-18: qty 2

## 2024-08-18 MED ORDER — SPIRONOLACTONE 12.5 MG HALF TABLET
12.5000 mg | ORAL_TABLET | Freq: Every day | ORAL | Status: DC
  Administered 2024-08-18 – 2024-08-19 (×2): 12.5 mg via ORAL
  Filled 2024-08-18 (×2): qty 1

## 2024-08-18 MED ORDER — ALBUTEROL SULFATE (2.5 MG/3ML) 0.083% IN NEBU
INHALATION_SOLUTION | RESPIRATORY_TRACT | Status: AC
Start: 2024-08-18 — End: 2024-08-18
  Administered 2024-08-18: 2.5 mg
  Filled 2024-08-18: qty 3

## 2024-08-18 MED ORDER — LOSARTAN POTASSIUM 25 MG PO TABS
12.5000 mg | ORAL_TABLET | Freq: Every day | ORAL | Status: DC
  Administered 2024-08-18 – 2024-08-19 (×2): 12.5 mg via ORAL
  Filled 2024-08-18 (×2): qty 1

## 2024-08-18 MED ORDER — IPRATROPIUM-ALBUTEROL 0.5-2.5 (3) MG/3ML IN SOLN
3.0000 mL | Freq: Three times a day (TID) | RESPIRATORY_TRACT | Status: DC
  Administered 2024-08-19 (×3): 3 mL via RESPIRATORY_TRACT
  Filled 2024-08-18 (×3): qty 3

## 2024-08-18 NOTE — Discharge Instructions (Signed)
  Providers Accepting New Patients in [place], [place]    [facility] [address]  [place], [place] [address] [phone] Accepts most insurances  [facility] [address] [place], [place] [address] [phone] Accepts most insurances  [facility] [address] [place], [place] [address]  [phone] Must meet requirements  [facility] [address] [place], [place] [address] [phone] Accepts most insurances  [facility] [address]  [place], [place] [address] [phone] Accepts most insurances  [facility] [address]   [place], [place]   [phone] Accepts most insurances  [facility])  [phone] [address]  [place], [place] [address] [phone] Accepts most insurances     [facility] [address]  [place], [place] [address] [phone] Accepts most insurances  [facility] [address] [place], [place] [address] [phone] option 1 Accepts Medicaid and [facility] [address]  [place], [place] [address] [phone] Accepts most insurances  Benita Outhouse, MD [address] [place], [place] [address] [phone] Accepts most insurances  [facility] [address]  [place], [place] [address] [phone] Accepts most insurances  [facility] [phone] [address] [place], [place] [address] [phone] Accepts most insurances  [place], [place] [address], [address] [place], [place] [address] [phone]  Accepts most insurances           Providers Accepting New Patients in [place], [place]    [facility] [address]  [place], [place] [address] [phone] Accepts most insurances  [facility] [address] [place], [place]  [address] [phone] Accepts most insurances  [facility] [address] [place], [place] [address]  [phone] Must meet requirements  [facility] [address] [place], [place] [address] [phone] Accepts most insurances  [facility] [address]  [place], [place] [address] [phone] Accepts most insurances  [facility] [address]   [place], [place]   [phone] Accepts most insurances  [facility])  [phone] [address]  [place], [place] [address] [phone] Accepts most insurances     [facility] [address]  [place], [place] [address] [phone] Accepts most insurances  [facility] [address] [place], [place] [address] [phone] option 1 Accepts Medicaid and [facility] Medicine [address]  [place], [place] [address] [phone] Accepts most insurances  Benita Outhouse, MD [address] [place], [place] [address] [phone] Accepts most insurances  [facility] at [facility] [address]  [place], [place] [address] [phone] Accepts most insurances  [facility] [phone] [address] [place], [place] [address] [phone] Accepts most insurances  [place], [place] [address], [address] [place], [place] [address] [phone]  Accepts most insurances

## 2024-08-18 NOTE — Progress Notes (Signed)
 Oncology progress note  Patient is a 68 year old male with metastatic non small cell lung carcinoma on single agent pembrolizumab  and doing well. Recent CT scan with stable disease. Even though patient had metastatic lung carcinoma, patient did very well with treatment so far and is currently on a treatment break. He did have good prognosis so far and does have treatment options available at this time.  Agree to proceed with further workup for his new onset heart failure. Immune mediated cardiomyopathy should be considered in differential.  Please reach out with any questions or concerns.  [name], MD Hematology/Oncology [facility]

## 2024-08-18 NOTE — Plan of Care (Signed)
  Problem: Education: Goal: Knowledge of General Education information will improve Description: Including pain rating scale, medication(s)/side effects and non-pharmacologic comfort measures Outcome: Progressing   Problem: Health Behavior/Discharge Planning: Goal: Ability to manage health-related needs will improve Outcome: Progressing   Problem: Clinical Measurements: Goal: Ability to maintain clinical measurements within normal limits will improve Outcome: Progressing Goal: Will remain free from infection Outcome: Progressing Goal: Diagnostic test results will improve Outcome: Progressing Goal: Respiratory complications will improve Outcome: Progressing Goal: Cardiovascular complication will be avoided Outcome: Progressing   Problem: Activity: Goal: Risk for activity intolerance will decrease Outcome: Progressing   Problem: Nutrition: Goal: Adequate nutrition will be maintained Outcome: Progressing   Problem: Coping: Goal: Level of anxiety will decrease Outcome: Progressing   Problem: Elimination: Goal: Will not experience complications related to bowel motility Outcome: Progressing Goal: Will not experience complications related to urinary retention Outcome: Progressing   Problem: Pain Managment: Goal: General experience of comfort will improve and/or be controlled Outcome: Progressing   Problem: Safety: Goal: Ability to remain free from injury will improve Outcome: Progressing   Problem: Skin Integrity: Goal: Risk for impaired skin integrity will decrease Outcome: Progressing   Problem: Education: Goal: Knowledge of General Education information will improve Description: Including pain rating scale, medication(s)/side effects and non-pharmacologic comfort measures Outcome: Progressing   Problem: Education: Goal: Ability to demonstrate management of disease process will improve Outcome: Progressing Goal: Ability to verbalize understanding of medication  therapies will improve Outcome: Progressing Goal: Individualized Educational Video(s) Outcome: Progressing   Problem: Activity: Goal: Capacity to carry out activities will improve Outcome: Progressing   Problem: Cardiac: Goal: Ability to achieve and maintain adequate cardiopulmonary perfusion will improve Outcome: Progressing   Problem: Education: Goal: Knowledge of disease or condition will improve Outcome: Progressing Goal: Knowledge of the prescribed therapeutic regimen will improve Outcome: Progressing Goal: Individualized Educational Video(s) Outcome: Progressing   Problem: Activity: Goal: Ability to tolerate increased activity will improve Outcome: Progressing Goal: Will verbalize the importance of balancing activity with adequate rest periods Outcome: Progressing   Problem: Respiratory: Goal: Ability to maintain a clear airway will improve Outcome: Progressing Goal: Levels of oxygenation will improve Outcome: Progressing Goal: Ability to maintain adequate ventilation will improve Outcome: Progressing

## 2024-08-18 NOTE — Progress Notes (Signed)
   08/18/24 1023  TOC Brief Assessment  Insurance and Status Reviewed  Patient has primary care physician No (PCP list added)  Home environment has been reviewed Home with family  Prior level of function: Independent  Prior/Current Home Services No current home services  Social Drivers of Health Review SDOH reviewed no interventions necessary  Readmission risk has been reviewed Yes  Transition of care needs no transition of care needs at this time   Inpatient Care Manager (ICM) has reviewed patient and no ICM needs have been identified at this time. We will continue to monitor patient advancement through interdisciplinary progression rounds. If new patient transition needs arise, please place a ICM consult.

## 2024-08-18 NOTE — Progress Notes (Signed)
 PROGRESS NOTE    Antonio Payne  FMW:[id number] DOB: 10-Mar-1956 DOA: 08/17/2024 PCP: Pcp, No    Brief Narrative:  68 year old with history of metastatic lung cancer on Keytruda  and currently on hold, hypothyroidism presented with at least 1 month of worsening shortness of breath and tense leg edema.  Followed at cancer center, echocardiogram on 11/11 with less than 20% ejection fraction.  Patient was started on oral Lasix.  Continues to be short of breath with mobility so came to the hospital.  In the emergency room tachycardic, blood pressure stable.  Troponin 41-40.  proBNP 12,562.  Chest x-ray with noted left versus right upper lobe unchanged from before.  Started on IV Lasix and admitted to the hospital.  Subjective: Patient seen and examined.  At rest denies any complaints.  Patient tells me when he starts walking he gets short of breath and he is swelling comes up from his legs to his chest. He also has significant wheezing.  Patient tells me that he could not afford Breo he was prescribed for his COPD.   Assessment & Plan:   Severe acute biventricular failure with ejection fraction less than 20%. 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  Grossly volume overloaded. Started on IV diuretics, increasing dose of IV Lasix to 80 mg twice daily.  Started on low-dose losartan.  Beta-blockers currently on hold. Intake and output monitoring. Cardiology following.  Likely cardiac cath once volume status improves. Mildly elevated troponins, no evidence of acute coronary syndrome.  Metastatic non-small cell lung cancer on Keytruda .  Followed by Dr. Davonna.  She will follow-up in the hospital.  Keytruda  remains on hold.  COPD: Significant symptoms.  Albuterol  as needed.  Patient could not afford steroid/LABA inhalers.  Will start patient on Pulmicort.  May discharge on Pulmicort nebulizer.  Hypothyroidism: On Synthroid .    DVT prophylaxis: Place TED hose Start:  08/18/24 1218 enoxaparin (LOVENOX) injection 40 mg Start: 08/18/24 1000 SCDs Start: 08/17/24 2219   Code Status: Full code Family Communication: None at the bedside Disposition Plan: Status is: Inpatient Remains inpatient appropriate because: IV diuresis     Consultants:  Cardiology Oncology  Procedures:  None  Antimicrobials:  None     Objective: Vitals:   08/18/24 0035 08/18/24 0437 08/18/24 0514 08/18/24 0629  BP: 110/82 113/81    Pulse: (!) 106 97    Resp: 18 19    Temp: 97.6 F (36.4 C) 97.7 F (36.5 C)    TempSrc: Oral Oral    SpO2: 93% 99%  97%  Weight:   73.6 kg   Height:        Intake/Output Summary (Last 24 hours) at 08/18/2024 1225 Last data filed at 08/18/2024 0447 Gross per 24 hour  Intake 600 ml  Output 650 ml  Net -50 ml   Filed Weights   08/17/24 1635 08/17/24 2018 08/18/24 0514  Weight: 67.1 kg 73.7 kg 73.6 kg    Examination:  General exam: Appears calm and comfortable when he is at rest.  Able to talk in complete sentences.  On room air. Respiratory system: Fairly comfortable breathing.  He has bilateral basal crackles.  No accessory muscle use. Cardiovascular system: S1 & S2 heard, RRR. No JVD.  Patient has 2+ tense pedal edema bilateral dorsum of the feet and legs. Gastrointestinal system: Abdomen is nondistended, soft and nontender. No organomegaly or masses felt. Normal bowel sounds heard. Central nervous system: Alert and oriented. No focal neurological deficits. Extremities: Symmetric 5 x 5 power.  Data Reviewed: I have personally reviewed following labs and imaging studies  CBC: Recent Labs  Lab 08/17/24 1737 08/18/24 0459  WBC 4.5 5.0  NEUTROABS 3.0  --   HGB 12.1* 12.2*  HCT 37.4* 37.9*  MCV 99.2 100.8*  PLT 277 246   Basic Metabolic Panel: Recent Labs  Lab 08/17/24 1737 08/18/24 0459  NA 134* 135  K 4.8 4.5  CL 99 98  CO2 27 26  GLUCOSE 90 94  BUN 21 22  CREATININE 0.93 0.95  CALCIUM 8.8* 9.0  MG  2.6* 2.5*  PHOS  --  3.8   GFR: Estimated Creatinine Clearance: 74.4 mL/min (by C-G formula based on SCr of 0.95 mg/dL). Liver Function Tests: Recent Labs  Lab 08/18/24 0459  AST 48*  ALT 82*  ALKPHOS 141*  BILITOT 0.4  PROT 6.4*  ALBUMIN 4.1   No results for input(s): LIPASE, AMYLASE in the last 168 hours. No results for input(s): AMMONIA in the last 168 hours. Coagulation Profile: No results for input(s): INR, PROTIME in the last 168 hours. Cardiac Enzymes: No results for input(s): CKTOTAL, CKMB, CKMBINDEX, TROPONINI in the last 168 hours. BNP (last 3 results) Recent Labs    07/16/24 1025 08/17/24 1737  PROBNP 6,828.0* 12,562.0*   HbA1C: No results for input(s): HGBA1C in the last 72 hours. CBG: No results for input(s): GLUCAP in the last 168 hours. Lipid Profile: No results for input(s): CHOL, HDL, LDLCALC, TRIG, CHOLHDL, LDLDIRECT in the last 72 hours. Thyroid  Function Tests: No results for input(s): TSH, T4TOTAL, FREET4, T3FREE, THYROIDAB in the last 72 hours. Anemia Panel: No results for input(s): VITAMINB12, FOLATE, FERRITIN, TIBC, IRON, RETICCTPCT in the last 72 hours. Sepsis Labs: No results for input(s): PROCALCITON, LATICACIDVEN in the last 168 hours.  No results found for this or any previous visit (from the past 240 hours).       Radiology Studies: DG Chest Port 1 View Result Date: 08/17/2024 EXAM: 1 VIEW(S) XRAY OF THE CHEST 08/17/2024 05:43:00 PM COMPARISON: 07/16/2024 CLINICAL HISTORY: SOB SOB FINDINGS: LUNGS AND PLEURA: Emphysema. Ill defined nodular opacity in the right upper lobe as before. No pleural effusion. No pneumothorax. HEART AND MEDIASTINUM: Aortic atherosclerosis (ICD10-I70.0). Mild cardiomegaly. BONES AND SOFT TISSUES: No acute osseous abnormality. IMPRESSION: 1. Ill-defined nodular opacity in the right upper lobe, unchanged from 07/16/2024, recommend continued imaging  follow-up per lung nodule guidelines if not already established. 2. Emphysema. 3. Mild cardiomegaly. Electronically signed by: Dayne Hassell MD 08/17/2024 05:47 PM EST RP Workstation: [id number]        Scheduled Meds:  enoxaparin (LOVENOX) injection  40 mg Subcutaneous Q24H   furosemide  20 mg Intravenous Once   furosemide  60 mg Intravenous BID   levothyroxine   100 mcg Oral Q0600   losartan  12.5 mg Oral Daily   spironolactone  12.5 mg Oral Daily   Continuous Infusions:   LOS: 1 day       Arsenio Schnorr, MD [facility]

## 2024-08-18 NOTE — Progress Notes (Signed)
 Heart Failure Navigator Progress Note  Attempted to call this patient on the phone to his room 331 @ [facility].  No answer at this time.  Reached out to his nurse  Mardy Mace, LPN to see if she could check on his phone for me and also provide him with the CHF education folder Living Better with Heart Failure.   Navigator will follow-up with patient.  Charmaine Pines, RN, BSN [facility] Heart Failure Navigator Secure Chat Only

## 2024-08-18 NOTE — Consult Note (Addendum)
 Cardiology Consultation   Patient ID: Antonio Payne MRN: [id number]; DOB: [birth year]  Admit date: 08/17/2024 Date of Consult: 08/18/2024  PCP:  Antonio Payne, No   Antonio Payne Cardiologist:  None   Patient Profile: Antonio Payne is a 68 y.o. male with a hx of Metastatic non-small cell lung carcinoma-adenocarcinoma on Keytruda  (dx in 2020, follows with Dr. Davonna), HFrEF w/ severe RV dysfunction  (EF <20% on 08/06/2024), COPD, hypothyroidism, adrenal insufficiency, and patient reports pericardial effusion s/p window approx 6 years ago who is being seen 08/18/2024 for the evaluation of acute HF at the request of Dr. Raenelle.  History of Present Illness: [name] has never been seen by heart care before any other cardiologist per Care Everywhere review.  Echo 08/06/2024 showed EF <20%, severely decreased LV function, global hypokinesis, G1 DD, elevated LVEDP, C/W RV pressure and volume overload, severely reduced RV function, severely elevated PASP with RVSP 61.7 mmHg, abnormal global longitudinal strain at -3.1%, severely dilated RA, mild to moderately dilated LA, mild MV regurgitation, moderate to severe TV regurgitation  Presents to [facility] for SOB.  TN 41 > 40, proBNP 12,562, normocytic anemia with Hgb 12, K 4.8 >4.5, Mg 2.6>2.5, Cr 0.93 >0.95 EKG: Sinus tachycardia, HR 105, nonspecific T wave changes with no acute ischemic changes. CXR showed ill-defined nodular opacity in the right upper lobe, unchanged from 10/15 2025, emphysema, and mild cardiomegaly Treated with IV Lasix 40 mg x2. I/O -50 ml, 162>162 lbs, Cr 0.93 >0.95  On interview, patient reports SOB for 1 year but worsening in the last 3 months with minimal exertion that is relieved with rest.  Notes some improvement with albuterol .  Patient was previously on Breo, which helped tremendously with SOB but was discontinued due to high cost. Notes occasional dizziness with SOB.   Also reports LE edema X 1 month.  That was  being treated with Lasix 40 mg twice daily by oncology, however he ran out of prescription last Wednesday.  Oncology also ordered ECHO and patient was scheduled for new cardiology outpatient eval on 11/19 in [place]. He sleeps with head of the bed inclined with 3 pillows for X1 month.  Also reports cough that is sometimes dry versus productive with white/brown phlegm.  Denies any palpitations, chest pain, syncope.  Denies any tobacco/EtOH/drug use currently but previously smoked cigarettes years ago.  He mainly drinks black coffee approximately 2 packs/day with an occasional soda or water.  Reports low-sodium diet.  Per patient only cardiac history is pericardial effusion s/p pericardial window approximately 6 years ago in [place] .  Past Medical History:  Diagnosis Date   Cancer (HCC)    lung cancer    CHF (congestive heart failure) (HCC)    COPD (chronic obstructive pulmonary disease) (HCC)    Dyspnea    Hypertension    Hypothyroidism     Past Surgical History:  Procedure Laterality Date   BACK SURGERY       Home Medications:  Prior to Admission medications   Medication Sig Start Date End Date Taking? Authorizing Provider  albuterol  (VENTOLIN  HFA) 108 (90 Base) MCG/ACT inhaler INHALE 2 PUFFS INTO THE LUNGS EVERY 4 HOURS AS NEEDED FOR WHEEZING OR SHORTNESS OF BREATH 08/06/24  Yes Kandala, Hyndavi, MD  furosemide (LASIX) 40 MG tablet Take 1 tablet (40 mg total) by mouth 2 (two) times daily. 08/15/24  Yes Antonio Payne Siad, MD  levothyroxine  (SYNTHROID ) 100 MCG tablet Take 1 tablet (100 mcg total) by mouth daily before  breakfast. 07/23/24  Yes Antonio Payne Siad, MD    Scheduled Meds:  enoxaparin (LOVENOX) injection  40 mg Subcutaneous Q24H   furosemide  40 mg Intravenous Q12H   levothyroxine   100 mcg Oral Q0600   Continuous Infusions:  PRN Meds: acetaminophen **OR** acetaminophen, ipratropium-albuterol , ondansetron **OR** ondansetron (ZOFRAN) IV  Allergies:   No Known Allergies  Social  History:   Social History   Socioeconomic History   Marital status: Single    Spouse name: Not on file   Number of children: 1   Years of education: Not on file   Highest education level: Not on file  Occupational History   Not on file  Tobacco Use   Smoking status: Former    Current packs/day: 0.00    Types: Cigarettes    Quit date: 12/01/2019    Years since quitting: 4.7    Passive exposure: Past   Smokeless tobacco: Never  Vaping Use   Vaping status: Never Used  Substance and Sexual Activity   Alcohol use: Never   Drug use: Never   Sexual activity: Not Currently    Birth control/protection: Abstinence  Other Topics Concern   Not on file  Social History Narrative   Patient states that he lives at home with his son, daughter in law and 64 year old granddaughter. Pt states that he is independent with ADL's and doesn't require any type of assistive devices. Pt states that he has no issues getting to and from the doctor, however, he has had an issue getting his doctor to call in his prescribed diuretic which in turn, has resulted in his legs and feet swelling to the point of weeping which is what brought him into the hospital today. Pt states he has a great support network within his home.     Family History:   History reviewed. No pertinent family history.   ROS:  Please see the history of present illness.   All other ROS reviewed and negative.     Physical Exam/Data: Vitals:   08/18/24 0035 08/18/24 0437 08/18/24 0514 08/18/24 0629  BP: 110/82 113/81    Pulse: (!) 106 97    Resp: 18 19    Temp: 97.6 F (36.4 C) 97.7 F (36.5 C)    TempSrc: Oral Oral    SpO2: 93% 99%  97%  Weight:   73.6 kg   Height:        Intake/Output Summary (Last 24 hours) at 08/18/2024 0900 Last data filed at 08/18/2024 0447 Gross per 24 hour  Intake 600 ml  Output 650 ml  Net -50 ml      08/18/2024    5:14 AM 08/17/2024    8:18 PM 08/17/2024    4:35 PM  Last 3 Weights  Weight  (lbs) 162 lb 4.1 oz 162 lb 7.7 oz 148 lb  Weight (kg) 73.6 kg 73.7 kg 67.132 kg     Body mass index is 23.96 kg/m.  General:  Well nourished, well developed, in no acute distress; accompanied with son and daughter in law.  HEENT: normal Neck: no JVD Vascular: No carotid bruits; Distal pulses 2+ bilaterally Cardiac:  normal S1, S2; RRR; no murmur  Lungs: wheezing throughout.  Abd: soft, nontender, no hepatomegaly  Ext: 1+ pitting edema, although legs and feer appear obviously swollen.  Musculoskeletal:  No deformities, BUE and BLE strength normal and equal Skin: warm and dry  Neuro:  CNs 2-12 intact, no focal abnormalities noted Psych:  Normal affect  EKG:  The EKG was personally reviewed and demonstrates: Sinus tachycardia, HR 105, nonspecific T wave changes with no acute ischemic changes. Telemetry:  Telemetry was personally reviewed and demonstrates:  ST, HR 90-100's, PVC's   Relevant CV Studies: ECHO IMPRESSIONS 08/06/2024  1. Left ventricular ejection fraction, by estimation, is <20%. Left  ventricular ejection fraction by 3D volume is 21 %. The left ventricle has  severely decreased function. The left ventricle demonstrates global  hypokinesis. Left ventricular diastolic  parameters are consistent with Grade I diastolic dysfunction (impaired  relaxation). Elevated left ventricular end-diastolic pressure. There is  the interventricular septum is flattened in systole and diastole,  consistent with right ventricular pressure  and volume overload. The average left ventricular global longitudinal  strain is -3.1 %. The global longitudinal strain is abnormal.   2. Right ventricular systolic function is severely reduced. The right  ventricular size is moderately enlarged. There is severely elevated  pulmonary artery systolic pressure. The estimated right ventricular  systolic pressure is 61.7 mmHg.   3. Left atrial size was mild to moderately dilated.   4. Right atrial size was  severely dilated.   5. The mitral valve is normal in structure. Mild mitral valve  regurgitation. No evidence of mitral stenosis.   6. The tricuspid valve is abnormal. Tricuspid valve regurgitation is  moderate to severe.   7. The aortic valve is tricuspid. Aortic valve regurgitation is not  visualized. No aortic stenosis is present.   8. The inferior vena cava is normal in size with greater than 50%  respiratory variability, suggesting right atrial pressure of 3 mmHg.   Comparison(s): No prior Echocardiogram.   Laboratory Data: High Sensitivity Troponin:  No results for input(s): TROPONINIHS in the last 720 hours.   Chemistry Recent Labs  Lab 08/17/24 1737 08/18/24 0459  NA 134* 135  K 4.8 4.5  CL 99 98  CO2 27 26  GLUCOSE 90 94  BUN 21 22  CREATININE 0.93 0.95  CALCIUM 8.8* 9.0  MG 2.6* 2.5*  GFRNONAA >60 >60  ANIONGAP 9 11    Recent Labs  Lab 08/18/24 0459  PROT 6.4*  ALBUMIN 4.1  AST 48*  ALT 82*  ALKPHOS 141*  BILITOT 0.4   Lipids No results for input(s): CHOL, TRIG, HDL, LABVLDL, LDLCALC, CHOLHDL in the last 168 hours.  Hematology Recent Labs  Lab 08/17/24 1737 08/18/24 0459  WBC 4.5 5.0  RBC 3.77* 3.76*  HGB 12.1* 12.2*  HCT 37.4* 37.9*  MCV 99.2 100.8*  MCH 32.1 32.4  MCHC 32.4 32.2  RDW 13.6 13.5  PLT 277 246   Thyroid  No results for input(s): TSH, FREET4 in the last 168 hours.  BNP Recent Labs  Lab 08/17/24 1737  PROBNP 12,562.0*    DDimer No results for input(s): DDIMER in the last 168 hours.  Radiology/Studies:  DG Chest Port 1 View Result Date: 08/17/2024 EXAM: 1 VIEW(S) XRAY OF THE CHEST 08/17/2024 05:43:00 PM COMPARISON: 07/16/2024 CLINICAL HISTORY: SOB SOB FINDINGS: LUNGS AND PLEURA: Emphysema. Ill defined nodular opacity in the right upper lobe as before. No pleural effusion. No pneumothorax. HEART AND MEDIASTINUM: Aortic atherosclerosis (ICD10-I70.0). Mild cardiomegaly. BONES AND SOFT TISSUES: No acute osseous  abnormality. IMPRESSION: 1. Ill-defined nodular opacity in the right upper lobe, unchanged from 07/16/2024, recommend continued imaging follow-up per lung nodule guidelines if not already established. 2. Emphysema. 3. Mild cardiomegaly. Electronically signed by: Dayne Hassell MD 08/17/2024 05:47 PM EST RP Workstation: [id number]     Assessment and  Plan: New acute on chronic HFrEF RV dysfunction Chest CT chest/abdomen/pelvic 08/06/2024: Centrilobular and paraseptal emphysema with no new pulmonary nodules and 2 previous spiculated nodules. Tiny right pleural effusion and trace left pleural effusion.  Echo 08/06/2024 showed EF <20%, severely decreased LV function, global hypokinesis, G1 DD, elevated LVEDP, C/W RV pressure and volume overload, severely reduced RV function, severely elevated PASP with RVSP 61.7 mmHg, abnormal global longitudinal strain at -3.1%, severely dilated RA, mild to moderately dilated LA, mild MV regurgitation, moderate to severe TV regurgitation.  Presents with with Lorsening SOB x 3 month, LE Edema x1 month, PND x 1 m/o.  He was being treated wasix 40 mg Bid but ran out of rx since last Wednesday.  proBNP 12,562 CXR not c/w with volume overload.  Treated with IV Lasix 40 mg x2. I/O -50 ml, 162>162 lbs, Cr 0.93 >0.95. Patient reports good urine output but is not using urinal. Will notify nurse for proper I/O documentation. Patient still having SOB with walking to restroom and LE edema is unchanged. Recommend increasing IV lasix to 60 mg BID f Appear volume overload with 1+ pitting edema.  Could be secondary to lung cancer or poorly treated COPD, however no previous ischemic evaluation.  Discussed R/LHC. Patient agrees to proceed. Will discuss with MD.  Reviewed GDMT medications and patient agrees to medication, however would hold on for now until after workup. Will plan to add GDMT as tolerated. Would consider adding spironolactone first to assist with diuresis.    Elevated  Troponin  TN 41 > 40 EKG: Sinus tachycardia, HR 105, nonspecific T wave changes with no acute ischemic changes. Tele: ST, 90-100's, frequent PVC's  Denies any chest pain  Although flat troponin, nonischemic EKG and no chest pain, concerned for ischemia due  EF of < 20%. Recommend R/LHC as above.   Elevated BP without diagnosis  Initial BP elevated 130-140/90's but most recent BP well controlled 113/81 Continue to monitor  COPD  Patient was previously on Breo with significant improvement in SOB, however d/c due to high cost. Consider alternative medication or financial assistance. Will defer to primary team.  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 Davonna  Risk Assessment/Risk Scores:  [place]  Heart Association (NYHA) Functional Class NYHA Class III       For questions or updates, please contact [facility] Please consult [email or web address] for contact info under      Signed, Lorette CINDERELLA Kapur, PA-C  08/18/2024 9:00 AM   Attending Note  Patient seen and discussed with PA Kapur, I agree with her documentation. 68 yo male history of metastatic lung CA on keytrud, hypothyroidism, admitted with SOB and LE edema progressing over the last several weeks. Denies any chest pains  K 4.8 BUN 21 Cr 0.93 WBC 4.5 Hgb 12.1 Plt 277 proBNP 12562 Mg 2.6  Trop 41--> 40 EKG sinus tach 105, nonspecific ST/T changes CXR: chronic RUL opacity, no acute process 08/2024 echo: LVEF <20%, grade I dd, Dshaped septum, severe RV dysfunction, severe pulm HTN,     1.Acute HFrEF/RV failure - presents with acute severe biventricular heart failure - 08/2024 echo: LVEF <20%, grade I dd, Dshaped septum, severe RV dysfunction, severe pulm HTN,  - CXR no acute findings, proBNP 12,562  -started on IV lasix 40mg  bid. I/Os incomplete. Weights essentially unchanged. Renal function is stable. Remains significantly volume  overloaded, increase IV lasix to 60mg   bid.  - check reds vest   - no beta blocker until more euvolemic, cautious use with severe LV and RV dysfunction. Likely would wait on CI back from [facility] before starting. Start losartan 12.5mg  daily, if bp's tolerate titrate and eventually transition to ARNI. Add aldactone 12.5mg  daily. Hold on SLGT2i pending potential invasive procedures.  - review chemo history regarding potential cardiotoxicity - would touch base with oncology to get overall prognosis and if any issues with a committement to DAPT if he were have a cath and intervention.  - would anticipate few days of IV diuresis, possibly cath mid to late week once euvolemic and if reasonable to pursue based on oncology feed back.     2.Metastatic lung CA - Patient with history of metastatic non-small cell lung carcinoma with metastasis to brain and lymph nodes (diagnosed in 2020).   3.COPD - per primary team  Dorn Ross MD

## 2024-08-19 DIAGNOSIS — I5041 Acute combined systolic (congestive) and diastolic (congestive) heart failure: Secondary | ICD-10-CM | POA: Diagnosis not present

## 2024-08-19 DIAGNOSIS — I5021 Acute systolic (congestive) heart failure: Secondary | ICD-10-CM | POA: Diagnosis not present

## 2024-08-19 DIAGNOSIS — I50811 Acute right heart failure: Secondary | ICD-10-CM | POA: Diagnosis not present

## 2024-08-19 LAB — BASIC METABOLIC PANEL WITH GFR
Anion gap: 11 (ref 5–15)
BUN: 24 mg/dL — ABNORMAL HIGH (ref 8–23)
CO2: 31 mmol/L (ref 22–32)
Calcium: 8.9 mg/dL (ref 8.9–10.3)
Chloride: 94 mmol/L — ABNORMAL LOW (ref 98–111)
Creatinine, Ser: 1.11 mg/dL (ref 0.61–1.24)
GFR, Estimated: >60 mL/min (ref >60)
Glucose, Bld: 91 mg/dL (ref 70–99)
Potassium: 4.1 mmol/L (ref 3.5–5.1)
Sodium: 135 mmol/L (ref 135–145)

## 2024-08-19 NOTE — Progress Notes (Signed)
 PROGRESS NOTE    CEASER EBELING  FMW:[id number] DOB: 04-08-1956 DOA: 08/17/2024 PCP: Pcp, No   Brief Narrative:    68 year old with history of metastatic lung cancer on Keytruda  and currently on hold, hypothyroidism presented with at least 1 month of worsening shortness of breath and tense leg edema.  Followed at cancer center, echocardiogram on 11/11 with less than 20% ejection fraction.  Patient was started on oral Lasix.  Continues to be short of breath with mobility so came to the hospital.  In the emergency room tachycardic, blood pressure stable.  Troponin 41-40.  proBNP 12,562.  Chest x-ray with noted left versus right upper lobe unchanged from before.  Started on IV Lasix and admitted to the hospital.  Cardiology following and plans are for eventual cardiac catheterization once euvolemic.  Assessment & Plan:   Principal Problem:   Acute combined systolic and diastolic CHF, NYHA class 2 (HCC)  Assessment and Plan:  Severe acute biventricular failure with ejection fraction less than 20%. 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   Grossly volume overloaded. Started on IV diuretics, increasing dose of IV Lasix to 80 mg twice daily with plans to hold this evening and then reassess in a.m. per cardiology  started on low-dose losartan.  Beta-blockers currently on hold. Intake and output monitoring. Cardiology following.  Likely cardiac cath once volume status improves.   Metastatic non-small cell lung cancer on Keytruda .  Followed by Dr. Davonna.  She will follow-up in the hospital.  Keytruda  remains on hold. -Will try to see if PET scan can be performed sooner   COPD: Significant symptoms.  Albuterol  as needed.  Patient could not afford steroid/LABA inhalers.  Will start patient on Pulmicort.  May discharge on Pulmicort nebulizer.   Hypothyroidism: On Synthroid .  TSH to be evaluated in a.m.    DVT prophylaxis:Lovenox Code Status: Full Family  Communication: Daughter at bedside Disposition Plan:  Status is: Inpatient Remains inpatient appropriate because: Need for IV medications.  Consultants:  Cardiology  Procedures:  None  Antimicrobials:  None   Subjective: Patient seen and evaluated today with no new acute complaints or concerns. No acute concerns or events noted overnight.  He is overall feeling better.  Objective: Vitals:   08/19/24 0433 08/19/24 0539 08/19/24 0800 08/19/24 1005  BP:  105/73  115/80  Pulse:  95  (!) 107  Resp:  18  20  Temp:  98 F (36.7 C)  98.4 F (36.9 C)  TempSrc:  Oral  Oral  SpO2:  97% 96% 99%  Weight: 69.5 kg     Height:        Intake/Output Summary (Last 24 hours) at 08/19/2024 1058 Last data filed at 08/19/2024 1027 Gross per 24 hour  Intake 240 ml  Output 5425 ml  Net -5185 ml   Filed Weights   08/17/24 2018 08/18/24 0514 08/19/24 0433  Weight: 73.7 kg 73.6 kg 69.5 kg    Examination:  General exam: Appears calm and comfortable  Respiratory system: Clear to auscultation. Respiratory effort normal. Cardiovascular system: S1 & S2 heard, RRR.  Gastrointestinal system: Abdomen is soft Central nervous system: Alert and awake Extremities: No edema Skin: No significant lesions noted Psychiatry: Flat affect.    Data Reviewed: I have personally reviewed following labs and imaging studies  CBC: Recent Labs  Lab 08/17/24 1737 08/18/24 0459  WBC 4.5 5.0  NEUTROABS 3.0  --   HGB 12.1* 12.2*  HCT 37.4* 37.9*  MCV 99.2 100.8*  PLT  277 246   Basic Metabolic Panel: Recent Labs  Lab 08/17/24 1737 08/18/24 0459 08/19/24 0507  NA 134* 135 135  K 4.8 4.5 4.1  CL 99 98 94*  CO2 27 26 31   GLUCOSE 90 94 91  BUN 21 22 24*  CREATININE 0.93 0.95 1.11  CALCIUM 8.8* 9.0 8.9  MG 2.6* 2.5*  --   PHOS  --  3.8  --    GFR: Estimated Creatinine Clearance: 62.6 mL/min (by C-G formula based on SCr of 1.11 mg/dL). Liver Function Tests: Recent Labs  Lab 08/18/24 0459   AST 48*  ALT 82*  ALKPHOS 141*  BILITOT 0.4  PROT 6.4*  ALBUMIN 4.1   No results for input(s): LIPASE, AMYLASE in the last 168 hours. No results for input(s): AMMONIA in the last 168 hours. Coagulation Profile: No results for input(s): INR, PROTIME in the last 168 hours. Cardiac Enzymes: No results for input(s): CKTOTAL, CKMB, CKMBINDEX, TROPONINI in the last 168 hours. BNP (last 3 results) Recent Labs    07/16/24 1025 08/17/24 1737  PROBNP 6,828.0* 12,562.0*   HbA1C: No results for input(s): HGBA1C in the last 72 hours. CBG: No results for input(s): GLUCAP in the last 168 hours. Lipid Profile: No results for input(s): CHOL, HDL, LDLCALC, TRIG, CHOLHDL, LDLDIRECT in the last 72 hours. Thyroid  Function Tests: No results for input(s): TSH, T4TOTAL, FREET4, T3FREE, THYROIDAB in the last 72 hours. Anemia Panel: No results for input(s): VITAMINB12, FOLATE, FERRITIN, TIBC, IRON, RETICCTPCT in the last 72 hours. Sepsis Labs: No results for input(s): PROCALCITON, LATICACIDVEN in the last 168 hours.  No results found for this or any previous visit (from the past 240 hours).       Radiology Studies: DG Chest Port 1 View Result Date: 08/17/2024 EXAM: 1 VIEW(S) XRAY OF THE CHEST 08/17/2024 05:43:00 PM COMPARISON: 07/16/2024 CLINICAL HISTORY: SOB SOB FINDINGS: LUNGS AND PLEURA: Emphysema. Ill defined nodular opacity in the right upper lobe as before. No pleural effusion. No pneumothorax. HEART AND MEDIASTINUM: Aortic atherosclerosis (ICD10-I70.0). Mild cardiomegaly. BONES AND SOFT TISSUES: No acute osseous abnormality. IMPRESSION: 1. Ill-defined nodular opacity in the right upper lobe, unchanged from 07/16/2024, recommend continued imaging follow-up per lung nodule guidelines if not already established. 2. Emphysema. 3. Mild cardiomegaly. Electronically signed by: Dayne Hassell MD 08/17/2024 05:47 PM EST RP Workstation:  [id number]        Scheduled Meds:  enoxaparin (LOVENOX) injection  40 mg Subcutaneous Q24H   furosemide  60 mg Intravenous BID   ipratropium-albuterol   3 mL Nebulization TID   levothyroxine   100 mcg Oral Q0600   losartan  12.5 mg Oral Daily   spironolactone  12.5 mg Oral Daily     LOS: 2 days    Time spent: 55 minutes    Timur Nibert [name], DO [facility]  If 7PM-7AM, please contact night-coverage [email or web address] 08/19/2024, 10:58 AM

## 2024-08-19 NOTE — TOC Initial Note (Signed)
 Transition of Care [facility]) - Initial/Assessment Note    Patient Details  Name: Antonio Payne MRN: [id number] Date of Birth: October 29, 1955  Transition of Care [facility]) CM/SW Contact:    Sharlyne Stabs, RN Phone Number: 08/19/2024, 9:14 AM  Clinical Narrative:         CM at the bedside with patient and daughter. Patient has been going to Oncology for several months, does not have a PCP. CM explained we will add a list of accepting doctors to his AVS for them to review and call for a follow up appointment. Patient has a hospital bed, no other DME needed. He drives himself as needed. He wanted copy of Health care POA paper work added to his chart. Devere is a friend, Added information to chart and copy of paper in folder to be scanned in patient's chart. Patient continuing to diuresis, planning to discharge home tomorrow will follow up with Dr. MARLA.    Expected Discharge Plan: Home/Self Care Barriers to Discharge: Continued Medical Work up   Patient Goals and CMS Choice Patient states their goals for this hospitalization and ongoing recovery are:: Return home CMS Medicare.gov Compare Post Acute Care list provided to:: Patient Choice offered to / list presented to : Patient [place] ownership interest in [facility].provided to:: Patient    Expected Discharge Plan and Services       Living arrangements for the past 2 months: Single Family Home                    Date [facility] Agency Contacted: 08/19/24 Time HH Agency Contacted: [id number] Representative spoke with at [facility] Agency: Darleene  Prior Living Arrangements/Services Living arrangements for the past 2 months: Single Family Home Lives with:: Self Patient language and need for interpreter reviewed:: Yes Do you feel safe going back to the place where you live?: Yes      Need for Family Participation in Patient Care: Yes (Comment) Care giver support system in place?: Yes (comment) Current home services: DME Criminal Activity/Legal  Involvement Pertinent to Current Situation/Hospitalization: No - Comment as needed  Activities of Daily Living   ADL Screening (condition at time of admission) Independently performs ADLs?: Yes (appropriate for developmental age) Is the patient deaf or have difficulty hearing?: No Does the patient have difficulty seeing, even when wearing glasses/contacts?: No Does the patient have difficulty concentrating, remembering, or making decisions?: No  Permission Sought/Granted Permission sought to share information with : Case Manager          Permission granted to share info w Relationship: Daughter     Emotional Assessment     Affect (typically observed): Accepting Orientation: : Oriented to Self, Oriented to Place, Oriented to Situation, Oriented to  Time Alcohol / Substance Use: Not Applicable Psych Involvement: No (comment)  Admission diagnosis:  Acute combined systolic and diastolic CHF, NYHA class 2 (HCC) [I50.41] Patient Active Problem List   Diagnosis Date Noted   Acute combined systolic and diastolic CHF, NYHA class 2 (HCC) 08/17/2024   Adenocarcinoma of lung, stage 4, right (HCC) 12/08/2020   PCP:  Pcp, No Pharmacy:   [facility] [phone] - [place], [place] - [address] AT [facility] [address] [place] [address] Phone: [phone] Fax: [phone]     Social Drivers of Health (SDOH) Social History: SDOH Screenings   Food Insecurity: No Food Insecurity (08/17/2024)  Housing: Low Risk  (08/17/2024)  Transportation Needs: No Transportation Needs (08/17/2024)  Utilities: Not At Risk (08/17/2024)  Alcohol Screen: Low Risk  (12/06/2020)  Depression (PHQ2-9): Low Risk  (08/12/2024)  Financial Resource Strain: Low Risk  (12/06/2020)  Physical Activity: Sufficiently Active (12/06/2020)  Social Connections: Moderately Isolated (08/17/2024)  Stress: No Stress Concern Present (12/06/2020)  Tobacco Use: Medium Risk (08/18/2024)

## 2024-08-19 NOTE — Progress Notes (Signed)
 Heart Failure Nurse Navigator Progress Note  PCP: Pcp, No PCP-Cardiologist: O'Neal- (First appointment pending) Admission Diagnosis: Acute congestive heart failure, unspecified heart failure type [facility]) Admitted from: Home   [facility]- patient called remotely at [facility] to his room 331.  2 Patient identifiers confirmed prior to phone conversation about HF education and scheduling AHF TOC appointment.  Presentation:   Antonio Payne is a 68 y.o. male.  He presented to [facility] with his daughter-in-law. He has history of metastatic lung cancer on Keytruda .    He presented with shortness of breath, cough and edema of his legs for the last month. Bilateral legs weeping.  Patient had run out of Lasix and symptoms progressed .  HS-Troponin 41.  Pro-BNP 12,562.  Chest x-ray: mild cardiomegaly, emphysema.   ECHO/ LVEF: <20  Clinical Course:  Past Medical History:  Diagnosis Date   Cancer (HCC)    lung cancer    CHF (congestive heart failure) (HCC)    COPD (chronic obstructive pulmonary disease) (HCC)    Dyspnea    Hypertension    Hypothyroidism      Social History   Socioeconomic History   Marital status: Single    Spouse name: Not on file   Number of children: 1   Years of education: Not on file   Highest education level: Not on file  Occupational History   Not on file  Tobacco Use   Smoking status: Former    Current packs/day: 0.00    Types: Cigarettes    Quit date: 12/01/2019    Years since quitting: 4.7    Passive exposure: Past   Smokeless tobacco: Never  Vaping Use   Vaping status: Never Used  Substance and Sexual Activity   Alcohol use: Never   Drug use: Never   Sexual activity: Not Currently    Birth control/protection: Abstinence  Other Topics Concern   Not on file  Social History Narrative   Patient states that he lives at home with his son, daughter in law and 61 year old granddaughter. Pt states that he is independent with ADL's and doesn't  require any type of assistive devices. Pt states that he has no issues getting to and from the doctor, however, he has had an issue getting his doctor to call in his prescribed diuretic which in turn, has resulted in his legs and feet swelling to the point of weeping which is what brought him into the hospital today. Pt states he has a great support network within his home.   Social Drivers of Corporate Investment Banker Strain: Low Risk  (12/06/2020)   Overall Financial Resource Strain (CARDIA)    Difficulty of Paying Living Expenses: Not hard at all  Food Insecurity: No Food Insecurity (08/17/2024)   Hunger Vital Sign    Worried About Running Out of Food in the Last Year: Never true    Ran Out of Food in the Last Year: Never true  Transportation Needs: No Transportation Needs (08/17/2024)   PRAPARE - Administrator, Civil Service (Medical): No    Lack of Transportation (Non-Medical): No  Physical Activity: Sufficiently Active (12/06/2020)   Exercise Vital Sign    Days of Exercise per Week: 7 days    Minutes of Exercise per Session: 30 min  Stress: No Stress Concern Present (12/06/2020)   [name] of Occupational Health - Occupational Stress Questionnaire    Feeling of Stress : Only a little  Social Connections:  Moderately Isolated (08/17/2024)   Social Connection and Isolation Panel    Frequency of Communication with Friends and Family: More than three times a week    Frequency of Social Gatherings with Friends and Family: More than three times a week    Attends Religious Services: 1 to 4 times per year    Active Member of [employer] or Organizations: No    Attends Engineer, Structural: Patient declined    Marital Status: Divorced   Water Engineer and Provision:  Detailed education and instructions provided on heart failure disease management including the following:  Signs and symptoms of Heart Failure When to call the physician Importance of daily  weights Low sodium diet Fluid restriction Medication management Anticipated future follow-up appointments  Patient education given on each of the above topics.  Patient acknowledges understanding via teach back method and acceptance of all instructions.  Education Materials:  Living Better With Heart Failure Booklet, HF zone tool, & Daily Weight Tracker Tool.  Patient has scale at home: Yes Patient has pill box at home: No.  Patient has never had to take many medications prior to this.     High Risk Criteria for Readmission and/or Poor Patient Outcomes: Heart failure hospital admissions (last 6 months): 1  No Show rate: 0% Difficult social situation: None determined at this point Demonstrates medication adherence: Yes Primary Language: English Literacy level: Reading, Writing & Comprehension  Barriers of Care:   Daily Weights Diet & Fluid Restrictions  Considerations/Referrals:  Referral made to Heart Failure Pharmacist Stewardship: N/A Referral made to Heart Failure CSW/NCM TOC: No Referral made to Heart & Vascular TOC clinic: Yes.  [facility] 08/26/24 @ 9:30 AM   Items for Follow-up on DC/TOC: Daily Weights-has never done daily weights before Diet & Fluid Intake-says he drinks coffee all day cold or hot.  Daughter-in- law cooks meals for him.  Patient shared that they are low salt. He does not add more salt at the table. New Heart Failure Medications Continued Failure Education  Charmaine Pines, RN, BSN [facility] Heart Failure Navigator Secure Chat Only

## 2024-08-19 NOTE — Progress Notes (Signed)
 Rounding Note   Patient Name: Antonio Payne Date of Encounter: 08/19/2024  [facility] Cardiologist: Burton (first appt pending)  Subjective No complaints  Scheduled Meds:  enoxaparin (LOVENOX) injection  40 mg Subcutaneous Q24H   furosemide  60 mg Intravenous BID   ipratropium-albuterol   3 mL Nebulization TID   levothyroxine   100 mcg Oral Q0600   losartan  12.5 mg Oral Daily   spironolactone  12.5 mg Oral Daily   Continuous Infusions:  PRN Meds: acetaminophen **OR** acetaminophen, ipratropium-albuterol , ondansetron **OR** ondansetron (ZOFRAN) IV   Vital Signs  Vitals:   08/19/24 0419 08/19/24 0433 08/19/24 0539 08/19/24 0800  BP:   105/73   Pulse:   95   Resp:   18   Temp:   98 F (36.7 C)   TempSrc:   Oral   SpO2: 93%  97% 96%  Weight:  69.5 kg    Height:        Intake/Output Summary (Last 24 hours) at 08/19/2024 0923 Last data filed at 08/19/2024 0700 Gross per 24 hour  Intake --  Output 3225 ml  Net -3225 ml      08/19/2024    4:33 AM 08/18/2024    5:14 AM 08/17/2024    8:18 PM  Last 3 Weights  Weight (lbs) 153 lb 3.5 oz 162 lb 4.1 oz 162 lb 7.7 oz  Weight (kg) 69.5 kg 73.6 kg 73.7 kg      Telemetry NSR and mild sinus tach - Personally Reviewed  ECG  N/a - Personally Reviewed  Physical Exam  GEN: No acute distress.   Neck: + JVD Cardiac: RRR, no murmurs, rubs, or gallops.  Respiratory: decreased breath sounds bilaterally GI: Soft, nontender, non-distended  MS: 1+ bilateral LE edema Neuro:  Nonfocal  Psych: Normal affect   Labs High Sensitivity Troponin:  No results for input(s): TROPONINIHS in the last 720 hours.   Chemistry Recent Labs  Lab 08/17/24 1737 08/18/24 0459 08/19/24 0507  NA 134* 135 135  K 4.8 4.5 4.1  CL 99 98 94*  CO2 27 26 31   GLUCOSE 90 94 91  BUN 21 22 24*  CREATININE 0.93 0.95 1.11  CALCIUM 8.8* 9.0 8.9  MG 2.6* 2.5*  --   PROT  --  6.4*  --   ALBUMIN  --  4.1  --   AST  --  48*  --   ALT   --  82*  --   ALKPHOS  --  141*  --   BILITOT  --  0.4  --   GFRNONAA >60 >60 >60  ANIONGAP 9 11 11     Lipids No results for input(s): CHOL, TRIG, HDL, LABVLDL, LDLCALC, CHOLHDL in the last 168 hours.  Hematology Recent Labs  Lab 08/17/24 1737 08/18/24 0459  WBC 4.5 5.0  RBC 3.77* 3.76*  HGB 12.1* 12.2*  HCT 37.4* 37.9*  MCV 99.2 100.8*  MCH 32.1 32.4  MCHC 32.4 32.2  RDW 13.6 13.5  PLT 277 246   Thyroid  No results for input(s): TSH, FREET4 in the last 168 hours.  BNP Recent Labs  Lab 08/17/24 1737  PROBNP 12,562.0*    DDimer No results for input(s): DDIMER in the last 168 hours.   Radiology  DG Chest Port 1 View Result Date: 08/17/2024 EXAM: 1 VIEW(S) XRAY OF THE CHEST 08/17/2024 05:43:00 PM COMPARISON: 07/16/2024 CLINICAL HISTORY: SOB SOB FINDINGS: LUNGS AND PLEURA: Emphysema. Ill defined nodular opacity in the right upper lobe as before. No  pleural effusion. No pneumothorax. HEART AND MEDIASTINUM: Aortic atherosclerosis (ICD10-I70.0). Mild cardiomegaly. BONES AND SOFT TISSUES: No acute osseous abnormality. IMPRESSION: 1. Ill-defined nodular opacity in the right upper lobe, unchanged from 07/16/2024, recommend continued imaging follow-up per lung nodule guidelines if not already established. 2. Emphysema. 3. Mild cardiomegaly. Electronically signed by: Dayne Hassell MD 08/17/2024 05:47 PM EST RP Workstation: [id number]      Patient Profile   Antonio Payne is a 68 y.o. male with a hx of Metastatic non-small cell lung carcinoma-adenocarcinoma on Keytruda  (dx in 2020, follows with Dr. Davonna), HFrEF w/ severe RV dysfunction  (EF <20% on 08/06/2024), COPD, hypothyroidism, adrenal insufficiency, and patient reports pericardial effusion s/p window approx 6 years ago who is being seen 08/18/2024 for the evaluation of acute HF at the request of Dr. Raenelle.   Assessment & Plan  1.Acute HFrEF/RV failure - presents with acute severe biventricular heart failure -  08/2024 echo: LVEF <20%, grade I dd, Dshaped septum, severe RV dysfunction, severe pulm HTN,  - CXR no acute findings, proBNP 12,562   -he is on IV lasix 60mg  bid. I/Os are incomplete, document 3.2 L of uop since admit. Weight 162-->153 lbs.  Slight uptrend in Cr with diuresis. Remains fluid overloaded, continue IV diuresis. Will write to hold lasix after PM dose and reassess in AM for continued dosing.   - no beta blocker until more euvolemic, cautious use with severe LV and RV dysfunction. May wait on CI back from RHC before starting.  -Started losartan 12.5mg  daily, if bp's tolerate titrate and eventually transition to ARNI. Added aldactone 12.5mg  daily. Hold on SLGT2i pending potential invasive procedures.  - SBP low 100s, would not titrate meds at this time  - review chemo history regarding potential cardiotoxicity - from discussions with Dr Davonna his oncologist, his cancer prognosis would not be felt to be prohibitive for aggressive HF management including possible cath. Refer to note in epic.  - continue IV diuresis. Once euvolemic would plan for transfer for RHC/LHC      2.Metastatic lung CA - Patient with history of metastatic non-small cell lung carcinoma with metastasis to brain and lymph nodes (diagnosed in 2020).    3.COPD - per primary team       For questions or updates, please contact [facility] Please consult [email or web address] for contact info under       Signed, Alvan Carrier, MD  08/19/2024, 9:23 AM

## 2024-08-20 ENCOUNTER — Ambulatory Visit: Admitting: Cardiovascular Disease

## 2024-08-20 ENCOUNTER — Other Ambulatory Visit (HOSPITAL_COMMUNITY): Payer: Self-pay

## 2024-08-20 ENCOUNTER — Telehealth (HOSPITAL_COMMUNITY): Payer: Self-pay | Admitting: Pharmacy Technician

## 2024-08-20 ENCOUNTER — Encounter: Payer: Self-pay | Admitting: Oncology

## 2024-08-20 DIAGNOSIS — I5021 Acute systolic (congestive) heart failure: Secondary | ICD-10-CM | POA: Diagnosis not present

## 2024-08-20 DIAGNOSIS — I50811 Acute right heart failure: Secondary | ICD-10-CM | POA: Diagnosis not present

## 2024-08-20 DIAGNOSIS — I5041 Acute combined systolic (congestive) and diastolic (congestive) heart failure: Secondary | ICD-10-CM | POA: Diagnosis not present

## 2024-08-20 LAB — BASIC METABOLIC PANEL WITH GFR
Anion gap: 8 (ref 5–15)
BUN: 25 mg/dL — ABNORMAL HIGH (ref 8–23)
CO2: 33 mmol/L — ABNORMAL HIGH (ref 22–32)
Calcium: 8.9 mg/dL (ref 8.9–10.3)
Chloride: 94 mmol/L — ABNORMAL LOW (ref 98–111)
Creatinine, Ser: 1.37 mg/dL — ABNORMAL HIGH (ref 0.61–1.24)
GFR, Estimated: 56 mL/min — ABNORMAL LOW (ref >60)
Glucose, Bld: 99 mg/dL (ref 70–99)
Potassium: 4.2 mmol/L (ref 3.5–5.1)
Sodium: 135 mmol/L (ref 135–145)

## 2024-08-20 LAB — MISC LABCORP TEST (SEND OUT): Labcorp test code: 83539

## 2024-08-20 LAB — TSH: TSH: 2.63 u[IU]/mL (ref 0.350–4.500)

## 2024-08-20 LAB — MAGNESIUM: Magnesium: 2.4 mg/dL (ref 1.7–2.4)

## 2024-08-20 MED ORDER — SODIUM CHLORIDE 0.9 % WEIGHT BASED INFUSION: 3.0000 mL/kg/h | INTRAVENOUS | Status: DC

## 2024-08-20 MED ORDER — BUDESONIDE 0.25 MG/2ML IN SUSP
0.2500 mg | Freq: Two times a day (BID) | RESPIRATORY_TRACT | Status: DC
  Administered 2024-08-20 – 2024-08-26 (×11): 0.25 mg via RESPIRATORY_TRACT
  Filled 2024-08-20 (×11): qty 2

## 2024-08-20 MED ORDER — IPRATROPIUM-ALBUTEROL 0.5-2.5 (3) MG/3ML IN SOLN
3.0000 mL | Freq: Four times a day (QID) | RESPIRATORY_TRACT | Status: DC
Start: 2024-08-20 — End: 2024-08-21
  Administered 2024-08-20 – 2024-08-21 (×3): 3 mL via RESPIRATORY_TRACT
  Filled 2024-08-20 (×3): qty 3

## 2024-08-20 MED ORDER — SODIUM CHLORIDE 0.9 % WEIGHT BASED INFUSION: 1.0000 mL/kg/h | INTRAVENOUS | Status: DC

## 2024-08-20 NOTE — Plan of Care (Signed)
  Problem: Education: Goal: Knowledge of General Education information will improve Description: Including pain rating scale, medication(s)/side effects and non-pharmacologic comfort measures Outcome: Progressing   Problem: Health Behavior/Discharge Planning: Goal: Ability to manage health-related needs will improve Outcome: Progressing   Problem: Clinical Measurements: Goal: Ability to maintain clinical measurements within normal limits will improve Outcome: Progressing Goal: Will remain free from infection Outcome: Progressing Goal: Diagnostic test results will improve Outcome: Progressing Goal: Respiratory complications will improve Outcome: Progressing Goal: Cardiovascular complication will be avoided Outcome: Progressing   Problem: Activity: Goal: Risk for activity intolerance will decrease Outcome: Progressing   Problem: Nutrition: Goal: Adequate nutrition will be maintained Outcome: Progressing   Problem: Coping: Goal: Level of anxiety will decrease Outcome: Progressing   Problem: Elimination: Goal: Will not experience complications related to bowel motility Outcome: Progressing Goal: Will not experience complications related to urinary retention Outcome: Progressing   Problem: Pain Managment: Goal: General experience of comfort will improve and/or be controlled Outcome: Progressing   Problem: Safety: Goal: Ability to remain free from injury will improve Outcome: Progressing   Problem: Skin Integrity: Goal: Risk for impaired skin integrity will decrease Outcome: Progressing   Problem: Education: Goal: Knowledge of General Education information will improve Description: Including pain rating scale, medication(s)/side effects and non-pharmacologic comfort measures Outcome: Progressing   Problem: Education: Goal: Ability to demonstrate management of disease process will improve Outcome: Progressing Goal: Ability to verbalize understanding of medication  therapies will improve Outcome: Progressing Goal: Individualized Educational Video(s) Outcome: Progressing   Problem: Activity: Goal: Capacity to carry out activities will improve Outcome: Progressing   Problem: Cardiac: Goal: Ability to achieve and maintain adequate cardiopulmonary perfusion will improve Outcome: Progressing   Problem: Education: Goal: Knowledge of disease or condition will improve Outcome: Progressing Goal: Knowledge of the prescribed therapeutic regimen will improve Outcome: Progressing Goal: Individualized Educational Video(s) Outcome: Progressing   Problem: Activity: Goal: Ability to tolerate increased activity will improve Outcome: Progressing Goal: Will verbalize the importance of balancing activity with adequate rest periods Outcome: Progressing   Problem: Respiratory: Goal: Ability to maintain a clear airway will improve Outcome: Progressing Goal: Levels of oxygenation will improve Outcome: Progressing Goal: Ability to maintain adequate ventilation will improve Outcome: Progressing

## 2024-08-20 NOTE — Plan of Care (Signed)
   Problem: Education: Goal: Knowledge of General Education information will improve Description Including pain rating scale, medication(s)/side effects and non-pharmacologic comfort measures Outcome: Adequate for Discharge   Problem: Health Behavior/Discharge Planning: Goal: Ability to manage health-related needs will improve Outcome: Adequate for Discharge

## 2024-08-20 NOTE — Progress Notes (Signed)
   08/19/24 2333  Assess: MEWS Score  Temp 98.8 F (37.1 C)  BP 98/71  MAP (mmHg) 81  Pulse Rate (!) 109  Resp 20  SpO2 96 %  O2 Device Room Air  Assess: MEWS Score  MEWS Temp 0  MEWS Systolic 1  MEWS Pulse 1  MEWS RR 0  MEWS LOC 0  MEWS Score 2  MEWS Score Color Yellow  Assess: if the MEWS score is Yellow or Red  Were vital signs accurate and taken at a resting state? Yes  Does the patient meet 2 or more of the SIRS criteria? No  MEWS guidelines implemented  Yes, yellow  Treat  MEWS Interventions Considered administering scheduled or prn medications/treatments as ordered  Take Vital Signs  Increase Vital Sign Frequency  Yellow: Q2hr x1, continue Q4hrs until patient remains green for 12hrs  Escalate  MEWS: Escalate Yellow: Discuss with charge nurse and consider notifying provider and/or RRT  Notify: Charge Nurse/RN  Name of Charge Nurse/RN Notified Metta Sar, RN  Assess: SIRS CRITERIA  SIRS Temperature  0  SIRS Respirations  0  SIRS Pulse 1  SIRS WBC 0  SIRS Score Sum  1

## 2024-08-20 NOTE — Progress Notes (Signed)
   08/20/24 0404  ReDS Vest / Clip  BMI (Calculated) 21.93  Station Marker  (ReDS clip not done due to BMI being too low)

## 2024-08-20 NOTE — Telephone Encounter (Signed)
 Patient Product/process Development Scientist completed.    The patient is insured through [facility]. Patient has Medicare and is not eligible for a copay card, but may be able to apply for patient assistance or Medicare RX Payment Plan (Patient Must reach out to their plan, if eligible for payment plan), if available.    Ran test claim for budesonide 0.25 mg/2ml and the current 30 day co-pay is $13.34.   This test claim was processed through [facility]- copay amounts may vary at other pharmacies due to pharmacy/plan contracts, or as the patient moves through the different stages of their insurance plan.     Reyes Sharps, CPHT Pharmacy Technician Patient Advocate Specialist [facility] Patient Advocate Team Direct Number: [phone]  Fax: [phone]

## 2024-08-20 NOTE — Progress Notes (Signed)
 PROGRESS NOTE    Antonio Payne  FMW:[id number] DOB: [birth year] DOA: 08/17/2024 PCP: Pcp, No   Brief Narrative:    68 year old with history of metastatic lung cancer on Keytruda  and currently on hold, hypothyroidism presented with at least 1 month of worsening shortness of breath and tense leg edema.  Followed at cancer center, echocardiogram on 11/11 with less than 20% ejection fraction.  Patient was started on oral Lasix.  Continues to be short of breath with mobility so came to the hospital.  In the emergency room tachycardic, blood pressure stable.  Troponin 41-40.  proBNP 12,562.  Chest x-ray with noted left versus right upper lobe unchanged from before.  Started on IV Lasix and admitted to the hospital.  Cardiology following and plans are for cardiac catheterization in a.m.  Assessment & Plan:   Principal Problem:   Acute combined systolic and diastolic CHF, NYHA class 2 (HCC)  Assessment and Plan:  Severe acute biventricular failure with ejection fraction less than 20%. 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   Started initially on IV diuretics, which have now been held along with spironolactone and ARB due to increasing creatinine levels. Cardiology now recommending transfer to [facility] for anticipated RHC/LHC 11/20 once there is further improvement in creatinine levels with holding of diuretics as well as improvement in wheezing.   Metastatic non-small cell lung cancer on Keytruda .  Followed by Dr. Davonna.  She will follow-up in the hospital.  Keytruda  remains on hold. -Will try to see if PET scan can be performed sooner   COPD: Noted ongoing wheezing -Plan to start Pulmicort twice daily -DuoNebs to be scheduled every 6 hours for 4 doses   Hypothyroidism: On Synthroid .   - TSH 2.63    DVT prophylaxis:Lovenox Code Status: Full Family Communication: None at bedside Disposition Plan:  Status is: Inpatient Remains inpatient appropriate  because: Need for IV medications.  Consultants:  Cardiology  Procedures:  None  Antimicrobials:  None   Subjective: Patient seen and evaluated today with no new acute complaints or concerns. No acute concerns or events noted overnight.  He is overall feeling better and understands the need for transfer for cardiac catheterization.  Has some ongoing wheezing.  Objective: Vitals:   08/19/24 1930 08/19/24 2333 08/20/24 0404 08/20/24 0812  BP: 104/74 98/71 107/77   Pulse: (!) 110 (!) 109 (!) 102   Resp: 20 20 20    Temp: 97.8 F (36.6 C) 98.8 F (37.1 C) 98.6 F (37 C)   TempSrc: Oral Oral Oral   SpO2: 95% 96% 95% 95%  Weight:   67.4 kg   Height:        Intake/Output Summary (Last 24 hours) at 08/20/2024 1051 Last data filed at 08/20/2024 0404 Gross per 24 hour  Intake 360 ml  Output 4000 ml  Net -3640 ml   Filed Weights   08/18/24 0514 08/19/24 0433 08/20/24 0404  Weight: 73.6 kg 69.5 kg 67.4 kg    Examination:  General exam: Appears calm and comfortable  Respiratory system: Minimal wheezing bilaterally. Respiratory effort normal. Cardiovascular system: S1 & S2 heard, RRR.  Gastrointestinal system: Abdomen is soft Central nervous system: Alert and awake Extremities: Bilateral lower extremity edema noted Skin: No significant lesions noted Psychiatry: Flat affect.    Data Reviewed: I have personally reviewed following labs and imaging studies  CBC: Recent Labs  Lab 08/17/24 1737 08/18/24 0459  WBC 4.5 5.0  NEUTROABS 3.0  --   HGB 12.1* 12.2*  HCT  37.4* 37.9*  MCV 99.2 100.8*  PLT 277 246   Basic Metabolic Panel: Recent Labs  Lab 08/17/24 1737 08/18/24 0459 08/19/24 0507 08/20/24 0458  NA 134* 135 135 135  K 4.8 4.5 4.1 4.2  CL 99 98 94* 94*  CO2 27 26 31  33*  GLUCOSE 90 94 91 99  BUN 21 22 24* 25*  CREATININE 0.93 0.95 1.11 1.37*  CALCIUM  8.8* 9.0 8.9 8.9  MG 2.6* 2.5*  --  2.4  PHOS  --  3.8  --   --    GFR: Estimated Creatinine  Clearance: 49.2 mL/min (A) (by C-G formula based on SCr of 1.37 mg/dL (H)). Liver Function Tests: Recent Labs  Lab 08/18/24 0459  AST 48*  ALT 82*  ALKPHOS 141*  BILITOT 0.4  PROT 6.4*  ALBUMIN 4.1   No results for input(s): LIPASE, AMYLASE in the last 168 hours. No results for input(s): AMMONIA in the last 168 hours. Coagulation Profile: No results for input(s): INR, PROTIME in the last 168 hours. Cardiac Enzymes: No results for input(s): CKTOTAL, CKMB, CKMBINDEX, TROPONINI in the last 168 hours. BNP (last 3 results) Recent Labs    07/16/24 1025 08/17/24 1737  PROBNP 6,828.0* 12,562.0*   HbA1C: No results for input(s): HGBA1C in the last 72 hours. CBG: No results for input(s): GLUCAP in the last 168 hours. Lipid Profile: No results for input(s): CHOL, HDL, LDLCALC, TRIG, CHOLHDL, LDLDIRECT in the last 72 hours. Thyroid  Function Tests: Recent Labs    08/20/24 0458  TSH 2.630   Anemia Panel: No results for input(s): VITAMINB12, FOLATE, FERRITIN, TIBC, IRON, RETICCTPCT in the last 72 hours. Sepsis Labs: No results for input(s): PROCALCITON, LATICACIDVEN in the last 168 hours.  No results found for this or any previous visit (from the past 240 hours).       Radiology Studies: No results found.       Scheduled Meds:  enoxaparin  (LOVENOX ) injection  40 mg Subcutaneous Q24H   ipratropium-albuterol   3 mL Nebulization Q6H   levothyroxine   100 mcg Oral Q0600     LOS: 3 days    Time spent: 55 minutes    Antonio Chancellor [name], DO [facility]  If 7PM-7AM, please contact night-coverage [email or web address] 08/20/2024, 10:51 AM

## 2024-08-20 NOTE — Progress Notes (Addendum)
 Rounding Note   Patient Name: Antonio Payne Date of Encounter: 08/20/2024  [facility] Cardiologist: New  Subjective SOB improving.   Scheduled Meds:  enoxaparin (LOVENOX) injection  40 mg Subcutaneous Q24H   levothyroxine   100 mcg Oral Q0600   Continuous Infusions:  PRN Meds: acetaminophen **OR** acetaminophen, ipratropium-albuterol , ondansetron **OR** ondansetron (ZOFRAN) IV   Vital Signs  Vitals:   08/19/24 1930 08/19/24 2333 08/20/24 0404 08/20/24 0812  BP: 104/74 98/71 107/77   Pulse: (!) 110 (!) 109 (!) 102   Resp: 20 20 20    Temp: 97.8 F (36.6 C) 98.8 F (37.1 C) 98.6 F (37 C)   TempSrc: Oral Oral Oral   SpO2: 95% 96% 95% 95%  Weight:   67.4 kg   Height:        Intake/Output Summary (Last 24 hours) at 08/20/2024 1010 Last data filed at 08/20/2024 0404 Gross per 24 hour  Intake 360 ml  Output 4700 ml  Net -4340 ml      08/20/2024    4:04 AM 08/19/2024    4:33 AM 08/18/2024    5:14 AM  Last 3 Weights  Weight (lbs) 148 lb 9.4 oz 153 lb 3.5 oz 162 lb 4.1 oz  Weight (kg) 67.4 kg 69.5 kg 73.6 kg      Telemetry NSR, isolated 11 beat run NSVT - Personally Reviewed  ECG  N/a - Personally Reviewed  Physical Exam  GEN: No acute distress.   Neck: No JVD Cardiac: RRR, no murmurs, rubs, or gallops.  Respiratory: Clear to auscultation bilaterally. GI: Soft, nontender, non-distended  MS: 1+ bilateral LE edema Neuro:  Nonfocal  Psych: Normal affect   Labs High Sensitivity Troponin:  No results for input(s): TROPONINIHS in the last 720 hours.   Chemistry Recent Labs  Lab 08/17/24 1737 08/18/24 0459 08/19/24 0507 08/20/24 0458  NA 134* 135 135 135  K 4.8 4.5 4.1 4.2  CL 99 98 94* 94*  CO2 27 26 31  33*  GLUCOSE 90 94 91 99  BUN 21 22 24* 25*  CREATININE 0.93 0.95 1.11 1.37*  CALCIUM 8.8* 9.0 8.9 8.9  MG 2.6* 2.5*  --  2.4  PROT  --  6.4*  --   --   ALBUMIN  --  4.1  --   --   AST  --  48*  --   --   ALT  --  82*  --   --    ALKPHOS  --  141*  --   --   BILITOT  --  0.4  --   --   GFRNONAA >60 >60 >60 56*  ANIONGAP 9 11 11 8     Lipids No results for input(s): CHOL, TRIG, HDL, LABVLDL, LDLCALC, CHOLHDL in the last 168 hours.  Hematology Recent Labs  Lab 08/17/24 1737 08/18/24 0459  WBC 4.5 5.0  RBC 3.77* 3.76*  HGB 12.1* 12.2*  HCT 37.4* 37.9*  MCV 99.2 100.8*  MCH 32.1 32.4  MCHC 32.4 32.2  RDW 13.6 13.5  PLT 277 246   Thyroid   Recent Labs  Lab 08/20/24 0458  TSH 2.630    BNP Recent Labs  Lab 08/17/24 1737  PROBNP 12,562.0*    DDimer No results for input(s): DDIMER in the last 168 hours.   Radiology  No results found.    Patient Profile   Antonio Payne is a 68 y.o. male with a hx of Metastatic non-small cell lung carcinoma-adenocarcinoma on Keytruda  (dx in 2020,  follows with Dr. Davonna), HFrEF w/ severe RV dysfunction (EF <20% on 08/06/2024), COPD, hypothyroidism, adrenal insufficiency, and patient reports pericardial effusion s/p window approx 6 years ago who is being seen 08/18/2024 for the evaluation of acute HF at the request of Dr. Raenelle.   Assessment & Plan  1.Acute HFrEF/RV failure - presents with acute severe biventricular heart failure - 08/2024 echo: LVEF <20%, grade I dd, Dshaped septum, severe RV dysfunction, severe pulm HTN,  - CXR no acute findings, proBNP 12,562   -he is on IV lasix  60mg  bid. Night time dose not showing as given, checking with nursing staff. I/Os suggest negative 5.6 L yesterday and 8.8 L since admission though incomplete data.  Weight 162-->148 lbs. Uptrend in Cr, hold further diuresis. Some ongoing LE edema though dramatically improved, likely some additional gradual diuresis once Cr normalizes.   - started on losartan  12.5mg , aldactone  12.5mg  daily. Soft bp's at times, have not titrated or transitoned to ARNI yet.  Holding SGLT2i until after cath.  - held beta blocker while volume overloaded. Cautious use given severe LV and RV  dysfunction, high likelihood of low cardiac index. Will hold until after cath results.  - AKI this AM, reasonable to hold ARB and MRA, would resume likely tomorrow  -has been on pembrolizumab  for his lung cancer several years, no overt signs of myocarditis.  - from discussions with Dr Davonna his oncologist, his cancer prognosis would not be felt to be prohibitive for aggressive HF management including possible cath. Refer to note in epic.  - plan to transfer to [facility] tomorrow. Pending results consider cMRI, possibly HF consultation given degree of dysfunction.   Informed Consent   Shared Decision Making/Informed Consent The risks [stroke (1 in 1000), death (1 in 1000), kidney failure [usually temporary] (1 in 500), bleeding (1 in 200), allergic reaction [possibly serious] (1 in 200)], benefits (diagnostic support and management of coronary artery disease) and alternatives of a cardiac catheterization were discussed in detail with Antonio Payne and he is willing to proceed.         2.Metastatic lung CA - Patient with history of metastatic non-small cell lung carcinoma with metastasis to brain and lymph nodes (diagnosed in 2020).    3.COPD - per primary team    For questions or updates, please contact [facility] Please consult [email or web address] for contact info under       Signed, Alvan Carrier, MD  08/20/2024, 10:10 AM

## 2024-08-21 ENCOUNTER — Telehealth (HOSPITAL_COMMUNITY): Payer: Self-pay

## 2024-08-21 ENCOUNTER — Encounter (HOSPITAL_COMMUNITY): Payer: Self-pay | Admitting: Internal Medicine

## 2024-08-21 ENCOUNTER — Other Ambulatory Visit: Payer: Self-pay

## 2024-08-21 ENCOUNTER — Other Ambulatory Visit (HOSPITAL_COMMUNITY): Payer: Self-pay

## 2024-08-21 ENCOUNTER — Inpatient Hospital Stay (HOSPITAL_COMMUNITY)

## 2024-08-21 ENCOUNTER — Encounter (HOSPITAL_COMMUNITY)
Admission: EM | Disposition: A | Discharge status: Home / Self Care | Payer: Self-pay | Source: Home / Self Care | Attending: Internal Medicine

## 2024-08-21 DIAGNOSIS — J449 Chronic obstructive pulmonary disease, unspecified: Secondary | ICD-10-CM | POA: Insufficient documentation

## 2024-08-21 DIAGNOSIS — I5041 Acute combined systolic (congestive) and diastolic (congestive) heart failure: Secondary | ICD-10-CM | POA: Diagnosis not present

## 2024-08-21 DIAGNOSIS — I2510 Atherosclerotic heart disease of native coronary artery without angina pectoris: Secondary | ICD-10-CM | POA: Diagnosis not present

## 2024-08-21 DIAGNOSIS — I10 Essential (primary) hypertension: Secondary | ICD-10-CM | POA: Insufficient documentation

## 2024-08-21 DIAGNOSIS — E039 Hypothyroidism, unspecified: Secondary | ICD-10-CM | POA: Insufficient documentation

## 2024-08-21 HISTORY — PX: RIGHT/LEFT HEART CATH AND CORONARY ANGIOGRAPHY: CATH118266

## 2024-08-21 LAB — BASIC METABOLIC PANEL WITH GFR
Anion gap: 11 (ref 5–15)
Anion gap: 15 (ref 5–15)
BUN: 26 mg/dL — ABNORMAL HIGH (ref 8–23)
BUN: 23 mg/dL (ref 8–23)
CO2: 27 mmol/L (ref 22–32)
CO2: 27 mmol/L (ref 22–32)
Calcium: 8.8 mg/dL — ABNORMAL LOW (ref 8.9–10.3)
Calcium: 8.8 mg/dL — ABNORMAL LOW (ref 8.9–10.3)
Chloride: 94 mmol/L — ABNORMAL LOW (ref 98–111)
Chloride: 94 mmol/L — ABNORMAL LOW (ref 98–111)
Creatinine, Ser: 1.12 mg/dL (ref 0.61–1.24)
Creatinine, Ser: 0.94 mg/dL (ref 0.61–1.24)
GFR, Estimated: >60 mL/min (ref >60)
GFR, Estimated: >60 mL/min (ref >60)
Glucose, Bld: 102 mg/dL — ABNORMAL HIGH (ref 70–99)
Glucose, Bld: 92 mg/dL (ref 70–99)
Potassium: 4.5 mmol/L (ref 3.5–5.1)
Potassium: 4.5 mmol/L (ref 3.5–5.1)
Sodium: 132 mmol/L — ABNORMAL LOW (ref 135–145)
Sodium: 136 mmol/L (ref 135–145)

## 2024-08-21 LAB — POCT I-STAT 7, (LYTES, BLD GAS, ICA,H+H)
Acid-Base Excess: 1 mmol/L (ref 0.0–2.0)
Bicarbonate: 25.5 mmol/L (ref 20.0–28.0)
Calcium, Ion: 1.19 mmol/L (ref 1.15–1.40)
HCT: 33 % — ABNORMAL LOW (ref 39.0–52.0)
Hemoglobin: 11.2 g/dL — ABNORMAL LOW (ref 13.0–17.0)
O2 Saturation: 95 %
Potassium: 4.5 mmol/L (ref 3.5–5.1)
Sodium: 133 mmol/L — ABNORMAL LOW (ref 135–145)
TCO2: 27 mmol/L (ref 22–32)
pCO2 arterial: 39.3 mmHg (ref 32–48)
pH, Arterial: 7.421 (ref 7.35–7.45)
pO2, Arterial: 75 mmHg — ABNORMAL LOW (ref 83–108)

## 2024-08-21 LAB — COOXEMETRY PANEL
Carboxyhemoglobin: 2.1 % — ABNORMAL HIGH (ref 0.5–1.5)
Methemoglobin: 1.3 % (ref 0.0–1.5)
O2 Saturation: 61.3 %
Total hemoglobin: 12.4 g/dL (ref 12.0–16.0)

## 2024-08-21 LAB — POCT I-STAT EG7
Acid-Base Excess: 1 mmol/L (ref 0.0–2.0)
Bicarbonate: 27 mmol/L (ref 20.0–28.0)
Calcium, Ion: 1.17 mmol/L (ref 1.15–1.40)
HCT: 33 % — ABNORMAL LOW (ref 39.0–52.0)
Hemoglobin: 11.2 g/dL — ABNORMAL LOW (ref 13.0–17.0)
O2 Saturation: 50 %
Potassium: 4.4 mmol/L (ref 3.5–5.1)
Sodium: 131 mmol/L — ABNORMAL LOW (ref 135–145)
TCO2: 28 mmol/L (ref 22–32)
pCO2, Ven: 49.1 mmHg (ref 44–60)
pH, Ven: 7.348 (ref 7.25–7.43)
pO2, Ven: 29 mmHg — CL (ref 32–45)

## 2024-08-21 LAB — HEMOGLOBIN A1C
Hgb A1c MFr Bld: 5.4 % (ref 4.8–5.6)
Mean Plasma Glucose: 108.28 mg/dL

## 2024-08-21 LAB — CBC
HCT: 34.9 % — ABNORMAL LOW (ref 39.0–52.0)
Hemoglobin: 11.2 g/dL — ABNORMAL LOW (ref 13.0–17.0)
MCH: 31.7 pg (ref 26.0–34.0)
MCHC: 32.1 g/dL (ref 30.0–36.0)
MCV: 98.9 fL (ref 80.0–100.0)
Platelets: 244 K/uL (ref 150–400)
RBC: 3.53 MIL/uL — ABNORMAL LOW (ref 4.22–5.81)
RDW: 13.3 % (ref 11.5–15.5)
WBC: 5.3 K/uL (ref 4.0–10.5)
nRBC: 0 % (ref 0.0–0.2)

## 2024-08-21 LAB — SEDIMENTATION RATE: Sed Rate: 11 mm/h (ref 0–16)

## 2024-08-21 LAB — LIPID PANEL
Cholesterol: 175 mg/dL (ref 0–200)
HDL: 65 mg/dL (ref >40)
LDL Cholesterol: 91 mg/dL (ref 0–99)
Total CHOL/HDL Ratio: 2.7 ratio
Triglycerides: 95 mg/dL (ref <150)
VLDL: 19 mg/dL (ref 0–40)

## 2024-08-21 LAB — MAGNESIUM: Magnesium: 2.1 mg/dL (ref 1.7–2.4)

## 2024-08-21 LAB — C-REACTIVE PROTEIN: CRP: 0.9 mg/dL (ref <1.0)

## 2024-08-21 SURGERY — RIGHT/LEFT HEART CATH AND CORONARY ANGIOGRAPHY
Anesthesia: LOCAL

## 2024-08-21 MED ORDER — VERAPAMIL HCL 2.5 MG/ML IV SOLN
INTRAVENOUS | Status: DC | PRN
  Administered 2024-08-21: 10 mL via INTRA_ARTERIAL

## 2024-08-21 MED ORDER — IPRATROPIUM-ALBUTEROL 0.5-2.5 (3) MG/3ML IN SOLN
3.0000 mL | Freq: Four times a day (QID) | RESPIRATORY_TRACT | Status: DC | PRN
  Administered 2024-08-22: 3 mL via RESPIRATORY_TRACT
  Filled 2024-08-21: qty 3

## 2024-08-21 MED ORDER — SODIUM CHLORIDE 0.9% FLUSH: 3.0000 mL | INTRAVENOUS | Status: DC | PRN

## 2024-08-21 MED ORDER — ARFORMOTEROL TARTRATE 15 MCG/2ML IN NEBU
15.0000 ug | INHALATION_SOLUTION | Freq: Two times a day (BID) | RESPIRATORY_TRACT | Status: DC
  Administered 2024-08-21 – 2024-08-26 (×10): 15 ug via RESPIRATORY_TRACT
  Filled 2024-08-21 (×11): qty 2

## 2024-08-21 MED ORDER — CHLORHEXIDINE GLUCONATE CLOTH 2 % EX PADS
6.0000 | MEDICATED_PAD | Freq: Every day | CUTANEOUS | Status: DC
  Administered 2024-08-22 – 2024-08-25 (×4): 6 via TOPICAL

## 2024-08-21 MED ORDER — SODIUM CHLORIDE 0.9% FLUSH: 10.0000 mL | INTRAVENOUS | Status: DC | PRN

## 2024-08-21 MED ORDER — IPRATROPIUM-ALBUTEROL 0.5-2.5 (3) MG/3ML IN SOLN
3.0000 mL | Freq: Four times a day (QID) | RESPIRATORY_TRACT | Status: DC
  Administered 2024-08-21 – 2024-08-22 (×3): 3 mL via RESPIRATORY_TRACT
  Filled 2024-08-21 (×4): qty 3

## 2024-08-21 MED ORDER — LOSARTAN POTASSIUM 25 MG PO TABS
25.0000 mg | ORAL_TABLET | Freq: Every day | ORAL | Status: DC
  Administered 2024-08-21 – 2024-08-24 (×4): 25 mg via ORAL
  Filled 2024-08-21 (×4): qty 1

## 2024-08-21 MED ORDER — HEPARIN SODIUM (PORCINE) 1000 UNIT/ML IJ SOLN
INTRAMUSCULAR | Status: AC
  Filled 2024-08-21: qty 10

## 2024-08-21 MED ORDER — HEPARIN SODIUM (PORCINE) 1000 UNIT/ML IJ SOLN
INTRAMUSCULAR | Status: DC | PRN
Start: 2024-08-21 — End: 2024-08-21
  Administered 2024-08-21: 3500 [IU] via INTRAVENOUS

## 2024-08-21 MED ORDER — VERAPAMIL HCL 2.5 MG/ML IV SOLN
INTRAVENOUS | Status: AC
  Filled 2024-08-21: qty 2

## 2024-08-21 MED ORDER — GUAIFENESIN ER 600 MG PO TB12
1200.0000 mg | ORAL_TABLET | Freq: Two times a day (BID) | ORAL | Status: AC
Start: 2024-08-21 — End: 2024-08-23
  Administered 2024-08-21 – 2024-08-23 (×6): 1200 mg via ORAL
  Filled 2024-08-21 (×6): qty 2

## 2024-08-21 MED ORDER — LIDOCAINE HCL (PF) 1 % IJ SOLN
INTRAMUSCULAR | Status: DC | PRN
  Administered 2024-08-21 (×2): 5 mL

## 2024-08-21 MED ORDER — HYDRALAZINE HCL 20 MG/ML IJ SOLN: 10.0000 mg | INTRAMUSCULAR | Status: AC | PRN

## 2024-08-21 MED ORDER — SODIUM CHLORIDE 0.9% FLUSH
10.0000 mL | Freq: Two times a day (BID) | INTRAVENOUS | Status: DC
  Administered 2024-08-21 – 2024-08-25 (×9): 10 mL

## 2024-08-21 MED ORDER — SODIUM CHLORIDE 0.9 % IV SOLN: 250.0000 mL | INTRAVENOUS | Status: AC | PRN

## 2024-08-21 MED ORDER — ASPIRIN 81 MG PO CHEW: 81.0000 mg | CHEWABLE_TABLET | ORAL | Status: DC

## 2024-08-21 MED ORDER — LIDOCAINE HCL (PF) 1 % IJ SOLN
INTRAMUSCULAR | Status: AC
  Filled 2024-08-21: qty 30

## 2024-08-21 MED ORDER — SODIUM CHLORIDE 0.9% FLUSH
3.0000 mL | Freq: Two times a day (BID) | INTRAVENOUS | Status: DC
  Administered 2024-08-21 – 2024-08-25 (×10): 3 mL via INTRAVENOUS

## 2024-08-21 MED ORDER — IOHEXOL 350 MG/ML SOLN
INTRAVENOUS | Status: DC | PRN
  Administered 2024-08-21: 35 mL

## 2024-08-21 MED ORDER — LOSARTAN POTASSIUM 25 MG PO TABS
12.5000 mg | ORAL_TABLET | Freq: Every day | ORAL | Status: DC
  Filled 2024-08-21: qty 1

## 2024-08-21 MED ORDER — ASPIRIN 81 MG PO CHEW
81.0000 mg | CHEWABLE_TABLET | ORAL | Status: AC
  Administered 2024-08-21: 81 mg via ORAL
  Filled 2024-08-21: qty 1

## 2024-08-21 MED ORDER — SODIUM CHLORIDE 0.9 % IV SOLN: INTRAVENOUS | Status: DC

## 2024-08-21 MED ORDER — DIGOXIN 125 MCG PO TABS
0.1250 mg | ORAL_TABLET | Freq: Every day | ORAL | Status: DC
  Administered 2024-08-21 – 2024-08-26 (×6): 0.125 mg via ORAL
  Filled 2024-08-21 (×6): qty 1

## 2024-08-21 MED ORDER — ENOXAPARIN SODIUM 40 MG/0.4ML IJ SOSY
40.0000 mg | PREFILLED_SYRINGE | INTRAMUSCULAR | Status: DC
  Administered 2024-08-21 – 2024-08-24 (×4): 40 mg via SUBCUTANEOUS
  Filled 2024-08-21 (×4): qty 0.4

## 2024-08-21 MED ORDER — SPIRONOLACTONE 12.5 MG HALF TABLET
12.5000 mg | ORAL_TABLET | Freq: Every day | ORAL | Status: DC
  Filled 2024-08-21: qty 1

## 2024-08-21 MED ORDER — FUROSEMIDE 10 MG/ML IJ SOLN
100.0000 mg | Freq: Four times a day (QID) | INTRAVENOUS | Status: AC
  Administered 2024-08-21 (×2): 100 mg via INTRAVENOUS
  Filled 2024-08-21 (×2): qty 10

## 2024-08-21 MED ORDER — HEPARIN (PORCINE) IN NACL 2000-0.9 UNIT/L-% IV SOLN
INTRAVENOUS | Status: DC | PRN
  Administered 2024-08-21: 1000 mL

## 2024-08-21 SURGICAL SUPPLY — 9 items
CATH BALLN WEDGE 5F 110CM (CATHETERS) IMPLANT
CATH INFINITI 5 FR JL3.5 (CATHETERS) IMPLANT
CATH INFINITI JR4 5F (CATHETERS) IMPLANT
DEVICE RAD COMP TR BAND LRG (VASCULAR PRODUCTS) IMPLANT
GLIDESHEATH SLEND SS 6F .021 (SHEATH) IMPLANT
GUIDEWIRE INQWIRE 1.5J.035X260 (WIRE) IMPLANT
PACK CARDIAC CATHETERIZATION (CUSTOM PROCEDURE TRAY) ×1 IMPLANT
SET ATX-X65L (MISCELLANEOUS) IMPLANT
SHEATH GLIDE SLENDER 4/5FR (SHEATH) IMPLANT

## 2024-08-21 NOTE — Consult Note (Signed)
 Cardiology Consultation:  Patient ID: Antonio Payne MRN: [id number]; DOB: 1955/10/07  Admit date: 08/17/2024 Date of Consult: 08/21/2024  Primary Care Provider: Freddrick, No Primary Cardiologist: Alvan Carrier, MD  Primary Electrophysiologist:  None   Patient Profile:   68 y.o. male with a history of metastatic non-small cell lung adenocarcinoma on Keytruda  (diagnosed in 2020, follows with Dr. Davonna), HFrEF with severe RV dysfunction (EF less than 20% in 08/06/2024), COPD, hypothyroidism, adrenal insufficiency, pericardial effusion s/p window about 6 years ago who presented initially to [facility] with complaints of increased shortness of breath and leg edema x 1 month admitted with acute heart failure exacerbation.  He had been on Lasix  with improvement in symptoms over that month.  At time however ran out of his diuretics several days prior to admission.  In the ED he was tachycardic, normotensive with elevated BNP 12562.  Given Lasix  in the ER and cardiology was consulted on 08/18/2024 and was diagnosed with acute severe biventricular heart failure.  After discussion with oncology, it was not felt that his cancer prognosis would prohibit aggressive heart failure management including possible catheterization and was transferred to [facility] on 11/19 to undergo [facility].  Sitting at the edge of the bed today. Antonio Payne he was a little short of breath earlier but does think he can lay flat and tolerate the anticipated catheterization.   Past Medical History: Past Medical History:  Diagnosis Date   Cancer (HCC)    lung cancer    CHF (congestive heart failure) (HCC)    COPD (chronic obstructive pulmonary disease) (HCC)    Dyspnea    Hypertension    Hypothyroidism     Past Surgical History: Past Surgical History:  Procedure Laterality Date   BACK SURGERY       Allergies:    No Known Allergies  Social History:   Social History   Socioeconomic History   Marital status: Single     Spouse name: Not on file   Number of children: 1   Years of education: Not on file   Highest education level: Not on file  Occupational History   Not on file  Tobacco Use   Smoking status: Former    Current packs/day: 0.00    Types: Cigarettes    Quit date: 12/01/2019    Years since quitting: 4.7    Passive exposure: Past   Smokeless tobacco: Never  Vaping Use   Vaping status: Never Used  Substance and Sexual Activity   Alcohol use: Never   Drug use: Never   Sexual activity: Not Currently    Birth control/protection: Abstinence  Other Topics Concern   Not on file  Social History Narrative   Patient states that he lives at home with his son, daughter in law and 81 year old granddaughter. Pt states that he is independent with ADL's and doesn't require any type of assistive devices. Pt states that he has no issues getting to and from the doctor, however, he has had an issue getting his doctor to call in his prescribed diuretic which in turn, has resulted in his legs and feet swelling to the point of weeping which is what brought him into the hospital today. Pt states he has a great support network within his home.   Social Drivers of Corporate Investment Banker Strain: Low Risk  (12/06/2020)   Overall Financial Resource Strain (CARDIA)    Difficulty of Paying Living Expenses: Not hard at all  Food Insecurity: No  Food Insecurity (08/17/2024)   Hunger Vital Sign    Worried About Running Out of Food in the Last Year: Never true    Ran Out of Food in the Last Year: Never true  Transportation Needs: No Transportation Needs (08/17/2024)   PRAPARE - Administrator, Civil Service (Medical): No    Lack of Transportation (Non-Medical): No  Physical Activity: Sufficiently Active (12/06/2020)   Exercise Vital Sign    Days of Exercise per Week: 7 days    Minutes of Exercise per Session: 30 min  Stress: No Stress Concern Present (12/06/2020)   [name] of Occupational Health -  Occupational Stress Questionnaire    Feeling of Stress : Only a little  Social Connections: Moderately Isolated (08/17/2024)   Social Connection and Isolation Panel    Frequency of Communication with Friends and Family: More than three times a week    Frequency of Social Gatherings with Friends and Family: More than three times a week    Attends Religious Services: 1 to 4 times per year    Active Member of [employer] or Organizations: No    Attends Banker Meetings: Patient declined    Marital Status: Divorced  Catering Manager Violence: Not At Risk (08/17/2024)   Humiliation, Afraid, Rape, and Kick questionnaire    Fear of Current or Ex-Partner: No    Emotionally Abused: No    Physically Abused: No    Sexually Abused: No     Family History:   History reviewed. No pertinent family history.   ROS:  All other ROS reviewed and negative. Pertinent positives noted in the HPI.     Physical Exam/Data:   Vitals:   08/20/24 2341 08/21/24 0040 08/21/24 0304 08/21/24 0523  BP: 100/72 111/75 106/77   Pulse: (!) 121 (!) 109 100   Resp: 18 18    Temp: 98.2 F (36.8 C) 98.5 F (36.9 C) 97.7 F (36.5 C)   TempSrc: Oral Oral Oral   SpO2: 98% 96% 97%   Weight:  69.3 kg  69.3 kg  Height:  5' 9 (1.753 m)      Intake/Output Summary (Last 24 hours) at 08/21/2024 0720 Last data filed at 08/21/2024 0105 Gross per 24 hour  Intake 0 ml  Output --  Net 0 ml       08/21/2024    5:23 AM 08/21/2024   12:40 AM 08/20/2024    4:04 AM  Last 3 Weights  Weight (lbs) 152 lb 12.5 oz 152 lb 11.2 oz 148 lb 9.4 oz  Weight (kg) 69.3 kg 69.264 kg 67.4 kg    Body mass index is 22.56 kg/m.  Physical Exam Vitals and nursing note reviewed.  Constitutional:      Appearance: Normal appearance.  HENT:     Head: Normocephalic and atraumatic.  Eyes:     Conjunctiva/sclera: Conjunctivae normal.  Neck:     Comments: JVD assessment difficult due to beard Cardiovascular:     Rate and Rhythm:  Regular rhythm. Tachycardia present.  Pulmonary:     Effort: Pulmonary effort is normal.     Breath sounds: Rales present.  Musculoskeletal:     Comments: 3+ bilateral lower extremity edema  Skin:    Coloration: Skin is not jaundiced or pale.  Neurological:     Mental Status: He is alert.      EKG:  The EKG was personally reviewed and demonstrates:  NSR Telemetry:  Telemetry was personally reviewed and demonstrates:  sinus tachycardia  Relevant CV Studies:  Echo 08/06/2024 showed EF <20%, severely decreased LV function, global hypokinesis, G1 DD, elevated LVEDP, C/W RV pressure and volume overload, severely reduced RV function, severely elevated PASP with RVSP 61.7 mmHg, abnormal global longitudinal strain at -3.1%, severely dilated RA, mild to moderately dilated LA, mild MV regurgitation, moderate to severe TV regurgitation   Assessment and Plan:  Acute biventricular failure - volume is improved compared to admission but still very volume overloaded. Has not received lasix since 11/18 and was started on pre cath IV fluids today. Weight is up from yesterday. Will stop IV fluids. He will need further diuresis but he is going for his [facility] soon. Would recommend diuresis following cath. Holding HF GDMT for now to prevent renal dysfunction as he is going to get a contrast load. If LHC is unremarkable, then would consider CMR in the setting of Pembrolizumab , no overt signs of myocarditis. Informed Consent   Shared Decision Making/Informed Consent The risks [stroke (1 in 1000), death (1 in 1000), kidney failure [usually temporary] (1 in 500), bleeding (1 in 200), allergic reaction [possibly serious] (1 in 200)], benefits (diagnostic support and management of coronary artery disease) and alternatives of a cardiac catheterization were discussed in detail with Antonio Payne and he is willing to proceed.    Metastatic lung CA COPD Tobacco abuse     For questions or updates, please contact [place]  [facility] Please consult [email or web address] for contact info under     Time Spent with Patient: I have spent a total of 65 minutes caring for this patient today face to face, ordering and reviewing labs/tests, reviewing prior records/medical history, examining the patient, establishing an assessment and plan, communicating results/findings to the patient/family, and documenting in the medical record.   Bonney Emeline Calender, DO [place]  [facility]  08/21/2024 7:20 AM

## 2024-08-21 NOTE — Assessment & Plan Note (Addendum)
-   Some wheezing on exam but no significant exacerbation.  No change in his cough or sputum - Responded well to multiple nebulizers throughout hospitalization -Stable breathing at discharge on room air

## 2024-08-21 NOTE — Assessment & Plan Note (Signed)
-   Currently controlled.  Regimen likely being modified with GDMT as per cardiology

## 2024-08-21 NOTE — TOC Initial Note (Signed)
 Transition of Care [facility]) - Initial/Assessment Note    Patient Details  Name: Antonio Payne MRN: [id number] Date of Birth: 1956-02-02  [facility] CM/SW Contact:    [name] Phone Number: [phone] 08/21/2024, 3:32 PM  Clinical Narrative:      3:05 PM- HF CSW attempted to meet with patient at bedside. Patient was not in the room at the time. CSW/CM will continue to make attempts to meet with the patient at a more appropriate time.   HF CSW/CM will continue to follow and monitor for dc readiness.                Expected Discharge Plan: Home/Self Care Barriers to Discharge: Continued Medical Work up   Patient Goals and CMS Choice Patient states their goals for this hospitalization and ongoing recovery are:: Return home CMS Medicare.gov Compare Post Acute Care list provided to:: Patient Choice offered to / list presented to : Patient [place] ownership interest in [facility].provided to:: Patient    Expected Discharge Plan and Services       Living arrangements for the past 2 months: Single Family Home                               Date [facility] Agency Contacted: 08/19/24 Time HH Agency Contacted: [id number] Representative spoke with at [facility] Agency: Darleene  Prior Living Arrangements/Services Living arrangements for the past 2 months: Single Family Home Lives with:: Self Patient language and need for interpreter reviewed:: Yes Do you feel safe going back to the place where you live?: Yes      Need for Family Participation in Patient Care: Yes (Comment) Care giver support system in place?: Yes (comment) Current home services: DME Criminal Activity/Legal Involvement Pertinent to Current Situation/Hospitalization: No - Comment as needed  Activities of Daily Living   ADL Screening (condition at time of admission) Independently performs ADLs?: Yes (appropriate for developmental age) Is the patient deaf or have difficulty hearing?: No Does the  patient have difficulty seeing, even when wearing glasses/contacts?: No Does the patient have difficulty concentrating, remembering, or making decisions?: No  Permission Sought/Granted Permission sought to share information with : Case Manager          Permission granted to share info w Relationship: Daughter     Emotional Assessment     Affect (typically observed): Accepting Orientation: : Oriented to Self, Oriented to Place, Oriented to Situation, Oriented to  Time Alcohol / Substance Use: Not Applicable Psych Involvement: No (comment)  Admission diagnosis:  Acute combined systolic and diastolic CHF, NYHA class 2 (HCC) [I50.41] Patient Active Problem List   Diagnosis Date Noted   COPD (chronic obstructive pulmonary disease) (HCC) 08/21/2024   HTN (hypertension) 08/21/2024   Hypothyroidism 08/21/2024   Acute combined systolic and diastolic CHF, NYHA class 2 (HCC) 08/17/2024   Adenocarcinoma of lung, stage 4, right (HCC) 12/08/2020   PCP:  Pcp, No Pharmacy:   [facility] [phone] - [place], [place] - [address] AT [facility] [address] [place] [address] Phone: [phone] Fax: [phone]     Social Drivers of Health (SDOH) Social History: SDOH Screenings   Food Insecurity: No Food Insecurity (08/17/2024)  Housing: Low Risk  (08/17/2024)  Transportation Needs: No Transportation Needs (08/17/2024)  Utilities: Not At Risk (08/17/2024)  Alcohol Screen: Low Risk  (12/06/2020)  Depression (PHQ2-9): Low Risk  (  08/12/2024)  Financial Resource Strain: Low Risk  (12/06/2020)  Physical Activity: Sufficiently Active (12/06/2020)  Social Connections: Moderately Isolated (08/17/2024)  Stress: No Stress Concern Present (12/06/2020)  Tobacco Use: Medium Risk (08/18/2024)   SDOH Interventions:     Readmission Risk Interventions     No data to display

## 2024-08-21 NOTE — Consult Note (Addendum)
 Advanced Heart Failure Team Consult Note   Primary Physician: Pcp, No Cardiologist:  Alvan Carrier, MD  Reason for Consultation: Acute BiV HF  HPI:    Antonio Payne is seen today for evaluation of Acute BiV HF at the request of Dr. Kriste.   Antonio Payne is a 68 y.o. 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   Now follows with Dr. Davonna. Started Keytruda  Q3 weeks back in August of 2020 (in [place]), eventually switched to Q6 weeks.   Echo 8/20: EF 58%, RV normal, no MR  Echo 11/20: EF 45%-47%, RV normal, trace MR/TR  He was seen by Dr. Davonna 10/25. She had concerns for HF and pneumonitis with SOB and pedal edema so CT CAP and echo were ordered. Keytruda  was stopped at this time.   CT CAP with previously seen nodules/carcinoma and mild CHF.   Echo 08/06/24 showed EF <20%, LV with GHK, RV severely reduced, LA mild-mod dilated, RA severely dilated, mild MR, mod-severe TR.   [facility] presented to [facility] with worsening SOB. Admission labs reviewed: Na 134, K 4.8, SCr 0.93, ProBNP 12.5K, HsTrop 41>40. Diuresis initially started but stopped 2/2 increased renal function. Transferred to [facility] for [facility]. L/RHC today with mild-mod non-obs CAD. Severely elevated L/R filling pressures. Mod-severely reduced fick CO/CI. RA 15, PA 63/32 (42), PCWP 30, PA 50%, Fick CO/CI 3.3/1.8. AHF team consulted with acute BiV HF with low output in the setting of Keytruda .   Patient resting comfortably in bed. Daughter in law at bedside. Currently denies CP/SOB.   Home Medications Prior to Admission medications   Medication Sig Start Date End Date Taking? Authorizing Provider  albuterol  (VENTOLIN  HFA) 108 (90 Base) MCG/ACT inhaler INHALE 2 PUFFS INTO THE LUNGS EVERY 4 HOURS AS NEEDED FOR WHEEZING OR SHORTNESS OF BREATH  08/06/24  Yes Davonna Siad, MD  furosemide  (LASIX ) 40 MG tablet Take 1 tablet (40 mg total) by mouth 2 (two) times daily. 08/15/24  Yes Davonna Siad, MD  levothyroxine  (SYNTHROID ) 100 MCG tablet Take 1 tablet (100 mcg total) by mouth daily before breakfast. 07/23/24  Yes Davonna Siad, MD    Past Medical History: Past Medical History:  Diagnosis Date   Cancer (HCC)    lung cancer    CHF (congestive heart failure) (HCC)    COPD (chronic obstructive pulmonary disease) (HCC)    Dyspnea    Hypertension    Hypothyroidism     Past Surgical History: Past Surgical History:  Procedure Laterality Date   BACK SURGERY      Family History: History reviewed. No pertinent family history.  Social History: Social History   Socioeconomic History   Marital status: Single    Spouse name: Not on file   Number of children: 1   Years of education: Not on file   Highest education level: Not on file  Occupational History   Not on file  Tobacco Use   Smoking status: Former    Current packs/day: 0.00    Types: Cigarettes    Quit date: 12/01/2019    Years since quitting: 4.7    Passive exposure: Past   Smokeless tobacco: Never  Vaping Use   Vaping status: Never Used  Substance and Sexual Activity   Alcohol use: Never   Drug use: Never   Sexual activity: Not Currently  Birth control/protection: Abstinence  Other Topics Concern   Not on file  Social History Narrative   Patient states that he lives at home with his son, daughter in law and 81 year old granddaughter. Pt states that he is independent with ADL's and doesn't require any type of assistive devices. Pt states that he has no issues getting to and from the doctor, however, he has had an issue getting his doctor to call in his prescribed diuretic which in turn, has resulted in his legs and feet swelling to the point of weeping which is what brought him into the hospital today. Pt states he has a great support network within  his home.   Social Drivers of Corporate Investment Banker Strain: Low Risk  (12/06/2020)   Overall Financial Resource Strain (CARDIA)    Difficulty of Paying Living Expenses: Not hard at all  Food Insecurity: No Food Insecurity (08/17/2024)   Hunger Vital Sign    Worried About Running Out of Food in the Last Year: Never true    Ran Out of Food in the Last Year: Never true  Transportation Needs: No Transportation Needs (08/17/2024)   PRAPARE - Administrator, Civil Service (Medical): No    Lack of Transportation (Non-Medical): No  Physical Activity: Sufficiently Active (12/06/2020)   Exercise Vital Sign    Days of Exercise per Week: 7 days    Minutes of Exercise per Session: 30 min  Stress: No Stress Concern Present (12/06/2020)   [name] of Occupational Health - Occupational Stress Questionnaire    Feeling of Stress : Only a little  Social Connections: Moderately Isolated (08/17/2024)   Social Connection and Isolation Panel    Frequency of Communication with Friends and Family: More than three times a week    Frequency of Social Gatherings with Friends and Family: More than three times a week    Attends Religious Services: 1 to 4 times per year    Active Member of Clubs or Organizations: No    Attends Engineer, Structural: Patient declined    Marital Status: Divorced    Allergies:  No Known Allergies  Objective:    Vital Signs:   Temp:  [97.4 F (36.3 C)-98.5 F (36.9 C)] 97.5 F (36.4 C) (11/20 0722) Pulse Rate:  [0-121] 0 (11/20 1048) Resp:  [15-21] 16 (11/20 1043) BP: (99-129)/(68-93) 114/84 (11/20 1043) SpO2:  [82 %-99 %] 96 % (11/20 1038) Weight:  [69.3 kg] 69.3 kg (11/20 0523) Last BM Date : 08/20/24  Weight change: Filed Weights   08/20/24 0404 08/21/24 0040 08/21/24 0523  Weight: 67.4 kg 69.3 kg 69.3 kg    Intake/Output:   Intake/Output Summary (Last 24 hours) at 08/21/2024 1134 Last data filed at 08/21/2024 0105 Gross per 24  hour  Intake 0 ml  Output --  Net 0 ml    Physical Exam   General:  elderly appearing.  No respiratory difficulty Neck: JVD elevated.  Cor: Tachy/regular rate & rhythm.  Lungs: diminished bases Extremities: +2-3 BLE edema  Neuro: alert & oriented x 3. Affect pleasant.   Telemetry   ST low 100s (Personally reviewed)    EKG    ST low 100s  Labs   Basic Metabolic Panel: Recent Labs  Lab 08/17/24 1737 08/18/24 0459 08/19/24 0507 08/20/24 0458 08/21/24 0302 08/21/24 0645  NA 134* 135 135 135 132* 136  K 4.8 4.5 4.1 4.2 4.5 4.5  CL 99 98 94* 94* 94* 94*  CO2 27 26 31  33* 27 27  GLUCOSE 90 94 91 99 102* 92  BUN 21 22 24* 25* 26* 23  CREATININE 0.93 0.95 1.11 1.37* 1.12 0.94  CALCIUM 8.8* 9.0 8.9 8.9 8.8* 8.8*  MG 2.6* 2.5*  --  2.4 2.1  --   PHOS  --  3.8  --   --   --   --     Liver Function Tests: Recent Labs  Lab 08/18/24 0459  AST 48*  ALT 82*  ALKPHOS 141*  BILITOT 0.4  PROT 6.4*  ALBUMIN 4.1   No results for input(s): LIPASE, AMYLASE in the last 168 hours. No results for input(s): AMMONIA in the last 168 hours.  CBC: Recent Labs  Lab 08/17/24 1737 08/18/24 0459 08/21/24 0302  WBC 4.5 5.0 5.3  NEUTROABS 3.0  --   --   HGB 12.1* 12.2* 11.2*  HCT 37.4* 37.9* 34.9*  MCV 99.2 100.8* 98.9  PLT 277 246 244    Cardiac Enzymes: No results for input(s): CKTOTAL, CKMB, CKMBINDEX, TROPONINI in the last 168 hours.  BNP: BNP (last 3 results) No results for input(s): BNP in the last 8760 hours.  ProBNP (last 3 results) Recent Labs    07/16/24 1025 08/17/24 1737  PROBNP 6,828.0* 12,562.0*     CBG: No results for input(s): GLUCAP in the last 168 hours.  Coagulation Studies: No results for input(s): LABPROT, INR in the last 72 hours.   Imaging   CARDIAC CATHETERIZATION Result Date: 08/21/2024   Prox RCA to Mid RCA lesion is 20% stenosed.   Prox Cx lesion is 30% stenosed.   Mid LAD lesion is 40% stenosed with 40%  stenosed side branch in 2nd Diag.   1st Diag lesion is 20% stenosed.   Prox LAD to Mid LAD lesion is 25% stenosed.   LV end diastolic pressure is severely elevated.   There is no aortic valve stenosis.   Anticipated discharge date to be determined.   Recommend Aspirin 81mg  daily for moderate CAD. Conclusions: Mild to moderate, non-obstructive coronary artery disease.  Findings are consistent with nonischemic cardiomyopathy. Severely elevated left heart, right heart, and pulmonary artery pressures. Moderately to severely reduced Fick cardiac output/index. Recommendations: Resume diuresis and initiate goal-directed medical therapy as tolerated. Consider advanced heart failure consultation. Medical therapy and risk factor modification to prevent progression of CAD. Lonni Hanson, MD [facility]  US  EKG SITE RITE Result Date: 08/21/2024 If Site Rite image not attached, placement could not be confirmed due to current cardiac rhythm.   Medications:    Current Medications:  arformoterol  15 mcg Nebulization BID   budesonide (PULMICORT) nebulizer solution  0.25 mg Nebulization BID   digoxin  0.125 mg Oral Daily   enoxaparin (LOVENOX) injection  40 mg Subcutaneous Q24H   guaiFENesin   1,200 mg Oral BID   ipratropium-albuterol   3 mL Nebulization QID   levothyroxine   100 mcg Oral Q0600   sodium chloride  flush  3 mL Intravenous Q12H    Infusions:  sodium chloride        Patient Profile   Antonio Payne is a 68 y.o. 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 AHF team to see for Acute BiV HF with low output state.   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 - L/RHC today with mild-mod non-obs CAD. Severely elevated L/R filling pressures. Mod-severely reduced fick CO/CI. RA 15, PA 63/32 (42), PCWP 30, PA 50%, Fick CO/CI 3.3/1.8. 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 Will increase diuretics to 100 mg IV BID. Follow response.  - Place PICC line to follow CVP and co-ox. If he diureses poorly may need to add inotrope. - Start digoxin  0.125 mg daily - GDMT as tolerated.  - Start losartan  12.5 mg daily - Check A1c, lipid panel - Check cMRI for prior myocarditis - CRP 0.9, ESR pending - Has been off Keytruda  for ~12 weeks now. Stopped 10/25.  - Place UNNA boots  - Strict I&O, daily weights   Metastatic non-small cell lung adenocarcinoma - Mets to brain and lymph nodes - Follows with Dr. Davonna.  - Off Keytruda  now for ~12 weeks. - With acute systolic HF would hold off on restarting Keytruda . There was a drop on initial echo after starting back in 2020, I see no other echos after that.    CAD - LHC today with mild-mod non-obs CAD - Denies CP - Check lipid panel - Suspect he will need to be on ASA/statin  COPD - per primary team   Length of Stay: 4  Beckey LITTIE Coe, NP  08/21/2024, 11:34 AM  Advanced Heart Failure Team Pager [phone] (M-F; 7a - 5p)  Please contact [facility] for night-coverage after hours (4p -7a ) and weekends on [email or web address]   Patient seen and examined with the above-signed Advanced Practice Provider and/or Housestaff. I personally reviewed laboratory data, imaging studies and relevant notes. I independently examined the patient and formulated the important aspects of the plan. I have edited the note to reflect any of my changes or salient points. I have personally discussed the plan with the patient and/or family.  68 y/o male as above with  h/o metastatic andenoCA of the lung who has been receiving Keytruda  (stopped several months ago due to concern over pneuomitis - I wonder if this was pulmonary edema)  Now admitted with acute systolic HF. Cath today with non-obstructive CAD, high filling pressures and low output  General:  Sitting up in bed. No resp difficulty HEENT: normal Neck: supple.JVP to ear  Cor: Regular rate & rhythm. No rubs, gallops or murmurs. Lungs:decreased throughout Abdomen: soft, nontender, nondistended.Good bowel sounds. Extremities: no cyanosis, clubbing, rash, 2-3+ edema Neuro: alert & orientedx3, cranial nerves grossly intact. moves all 4 extremities w/o difficulty. Affect pleasant  Suspect Keytruda  toxicity. Keytruda  has been stopped. Place PICC and follow CVP/co-ox. IV diuresis and titration of GDMT. Low threshold to add inotropes as needed.  Will check cMRI.   Toribio Fuel, MD  6:04 PM

## 2024-08-21 NOTE — Interval H&P Note (Signed)
 History and Physical Interval Note:  08/21/2024 10:12 AM  Antonio Payne  has presented today for surgery, with the diagnosis of acute HFrEF.  The various methods of treatment have been discussed with the patient and family. After consideration of risks, benefits and other options for treatment, the patient has consented to  Procedure(s): RIGHT/LEFT HEART CATH AND CORONARY ANGIOGRAPHY (N/A) as a surgical intervention.  The patient's history has been reviewed, patient examined, no change in status, stable for surgery.  I have reviewed the patient's chart and labs.  Questions were answered to the patient's satisfaction.    Cath Lab Visit (complete for each Cath Lab visit)  Clinical Evaluation Leading to the Procedure:   ACS: No.  Non-ACS:    Anginal/Heart Failure Classification: NYHA class IV  Anti-ischemic medical therapy: No Therapy  Non-Invasive Test Results: LVEF < 20% -> high risk  Prior CABG: No previous CABG  [name]

## 2024-08-21 NOTE — Assessment & Plan Note (Signed)
-   TSH normal on admission, 2.630 - Continue Synthroid

## 2024-08-21 NOTE — Plan of Care (Signed)
   Problem: Clinical Measurements: Goal: Diagnostic test results will improve Outcome: Progressing   Problem: Activity: Goal: Risk for activity intolerance will decrease Outcome: Progressing

## 2024-08-21 NOTE — Progress Notes (Signed)
 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 Cardiology was consulted as well on admission.  He was treated for CHF exacerbation and started on diuresis with IV Lasix . He was transferred to [facility] for right and left heart cath, performed on 08/21/2024.  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  He had elevated pressures in left, right heart and pulmonary artery.  Moderate to severely reduced cardiac output.  Advanced CHF team consulted afterwards.  Interval History:  No events overnight.  Transferred from [facility] for ongoing heart workup.  Undergoing right and left heart cath today. Still has some ongoing wheezing and congested cough but denies any change in his sputum.  Assessment and Plan: * Acute combined systolic and diastolic CHF, NYHA class 2 (HCC) - presented with SOB/DOE.  Workup notable for cardiomegaly, LE edema, small pleural effusion - initial pro-BNP on admission 12,562 - s/p IV lasix  at [facility] - s/p R/L heart cath on 08/21/24 at [facility] NICM noted. Severely elevated PA psi and R/L heart psi. Mod/sev reduced CO - EF < 20% on echo; Gr 1 DD. Dilated RA, LA. RV sys function severely reduced and enlarged RV - advanced CHF team consulted post cath    Adenocarcinoma of lung, stage 4, right (HCC) - Follows with oncology.  Has been treated with single agent pembrolizumab  and currently noted to be on treatment break - Per oncology, also noted to have responded well with treatment and prognosis does not limit CHF treatment  Hypothyroidism - TSH normal on admission, 2.630 - Continue Synthroid   HTN  (hypertension) - Currently controlled.  Regimen likely being modified with GDMT as per cardiology  COPD (chronic obstructive pulmonary disease) (HCC) - Some wheezing on exam but no significant exacerbation.  No change in his cough or sputum - Continue DuoNebs scheduled and as needed for now -Continue budesonide  and Brovana  nebs - Adding Mucinex  as well   Antimicrobials:   DVT prophylaxis:  enoxaparin  (LOVENOX ) injection 40 mg Start: 08/21/24 2200 Place TED hose Start: 08/18/24 1218 SCDs Start: 08/17/24 2219   Code Status:   Code Status: Full Code  Mobility Assessment (Last 72 Hours)     Mobility Assessment     Row Name 08/21/24 0844 08/21/24 0140 08/20/24 1932 08/20/24 0800 08/19/24 1944   Does the patient have exclusion criteria? No - Perform mobility assessment No - Perform mobility assessment No - Perform mobility assessment No - Perform mobility assessment No - Perform mobility assessment   What is the highest level of mobility based on the mobility assessment? Level 5 (Ambulates independently) - Balance while walking independently - Complete Level 5 (Ambulates independently) - Balance while walking independently - Complete Level 5 (Ambulates independently) - Balance while walking independently - Complete Level 5 (Ambulates independently) - Balance while walking independently - Complete Level 5 (Ambulates independently) - Balance while walking independently - Complete    Row Name 08/19/24 0749 08/18/24 2022         Does the patient have exclusion criteria? No - Perform mobility assessment No - Perform mobility assessment      What is  the highest level of mobility based on the mobility assessment? Level 5 (Ambulates independently) - Balance while walking independently - Complete Level 5 (Ambulates independently) - Balance while walking independently - Complete         Diet: Diet Orders (From admission, onward)     Start     Ordered   08/21/24 1107  Diet Heart Room service  appropriate? Yes; Fluid consistency: Thin; Fluid restriction: 1500 mL Fluid  Diet effective now       Question Answer Comment  Room service appropriate? Yes   Fluid consistency: Thin   Fluid restriction: 1500 mL Fluid      08/21/24 1106            Barriers to discharge: none Disposition Plan:  Home HH orders placed: TBD Status is: Inpt  Objective: Blood pressure (!) 119/90, pulse (!) 110, temperature 97.9 F (36.6 C), temperature source Oral, resp. rate 18, height 5' 9 (1.753 m), weight 69.3 kg, SpO2 93%.  Examination:  Physical Exam Constitutional:      General: He is not in acute distress.    Appearance: Normal appearance.  HENT:     Head: Normocephalic and atraumatic.     Mouth/Throat:     Mouth: Mucous membranes are moist.  Eyes:     Extraocular Movements: Extraocular movements intact.  Cardiovascular:     Rate and Rhythm: Normal rate and regular rhythm.  Pulmonary:     Effort: Pulmonary effort is normal. No respiratory distress.     Breath sounds: Wheezing present.  Abdominal:     General: Bowel sounds are normal. There is no distension.     Palpations: Abdomen is soft.     Tenderness: There is no abdominal tenderness.  Musculoskeletal:        General: Normal range of motion.     Cervical back: Normal range of motion and neck supple.     Right lower leg: Edema (2+) present.     Left lower leg: Edema (2+) present.  Skin:    General: Skin is warm and dry.  Neurological:     General: No focal deficit present.     Mental Status: He is alert.  Psychiatric:        Mood and Affect: Mood normal.        Behavior: Behavior normal.      Consultants:  Cardiology/advanced CHF  Procedures:  R/L heart cath: 08/21/24  Data Reviewed: Results for orders placed or performed during the hospital encounter of 08/17/24 (from the past 24 hours)  Basic metabolic panel with GFR     Status: Abnormal   Collection Time: 08/21/24  3:02 AM  Result Value Ref Range   Sodium  132 (L) 135 - 145 mmol/L   Potassium 4.5 3.5 - 5.1 mmol/L   Chloride 94 (L) 98 - 111 mmol/L   CO2 27 22 - 32 mmol/L   Glucose, Bld 102 (H) 70 - 99 mg/dL   BUN 26 (H) 8 - 23 mg/dL   Creatinine, Ser 8.87 0.61 - 1.24 mg/dL   Calcium 8.8 (L) 8.9 - 10.3 mg/dL   GFR, Estimated >39 >39 mL/min   Anion gap 11 5 - 15  CBC     Status: Abnormal   Collection Time: 08/21/24  3:02 AM  Result Value Ref Range   WBC 5.3 4.0 - 10.5 K/uL   RBC 3.53 (L) 4.22 - 5.81 MIL/uL   Hemoglobin 11.2 (L) 13.0 - 17.0 g/dL   HCT 65.0 (L) 60.9 -  52.0 %   MCV 98.9 80.0 - 100.0 fL   MCH 31.7 26.0 - 34.0 pg   MCHC 32.1 30.0 - 36.0 g/dL   RDW 86.6 88.4 - 84.4 %   Platelets 244 150 - 400 K/uL   nRBC 0.0 0.0 - 0.2 %  Magnesium     Status: None   Collection Time: 08/21/24  3:02 AM  Result Value Ref Range   Magnesium 2.1 1.7 - 2.4 mg/dL  Basic metabolic panel with GFR     Status: Abnormal   Collection Time: 08/21/24  6:45 AM  Result Value Ref Range   Sodium 136 135 - 145 mmol/L   Potassium 4.5 3.5 - 5.1 mmol/L   Chloride 94 (L) 98 - 111 mmol/L   CO2 27 22 - 32 mmol/L   Glucose, Bld 92 70 - 99 mg/dL   BUN 23 8 - 23 mg/dL   Creatinine, Ser 9.05 0.61 - 1.24 mg/dL   Calcium 8.8 (L) 8.9 - 10.3 mg/dL   GFR, Estimated >39 >39 mL/min   Anion gap 15 5 - 15  C-reactive protein     Status: None   Collection Time: 08/21/24 11:33 AM  Result Value Ref Range   CRP 0.9 <1.0 mg/dL    I have reviewed pertinent nursing notes, vitals, labs, and images as necessary. I have ordered labwork to follow up on as indicated.  I have reviewed the last notes from staff over past 24 hours. I have discussed patient's care plan and test results with nursing staff, CM/SW, and other staff as appropriate.  Old records reviewed in assessment of this patient  Time spent: Greater than 50% of the 55 minute visit was spent in counseling/coordination of care for the patient as laid out in the A&P.   LOS: 4 days   Alm Apo, MD [facility] 08/21/2024, 12:42 PM

## 2024-08-21 NOTE — Assessment & Plan Note (Addendum)
-   Follows with oncology.  Has been treated with single agent pembrolizumab  and currently noted to be on treatment break - Per oncology, also noted to have responded well with treatment and prognosis does not limit CHF treatment - Keytruda  has been recommended to be held per cardiology and contingent on further discussion with oncology on treatment regimen going forward

## 2024-08-21 NOTE — Hospital Course (Signed)
 [name] is a 68 yo male with PMH stage IV NSCLC on pembrolizumab  (on treatment break per oncology), COPD, emphysema, CHF, HTN, hypothyroidism who initially presented to [facility] with worsening lower extremity edema and shortness of breath. Cardiology was consulted as well on admission.  He was treated for CHF exacerbation and started on diuresis with IV Lasix . He was transferred to [facility] for right and left heart cath, performed on 08/21/2024.  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  He had elevated pressures in left, right heart and pulmonary artery.  Moderate to severely reduced cardiac output.  Advanced CHF team consulted afterwards.

## 2024-08-21 NOTE — Progress Notes (Signed)
 Orthopedic Tech Progress Note Patient Details:  Antonio Payne 07/15/1956 [id number]  Ortho Devices Type of Ortho Device: Radio broadcast assistant Ortho Device/Splint Location: Bilateral Ortho Device/Splint Interventions: Ordered, Application, Adjustment   Post Interventions Patient Tolerated: Fair Instructions Provided: Adjustment of device, Care of device  [name] [name] 08/21/2024, 1:57 PM

## 2024-08-21 NOTE — Plan of Care (Signed)

## 2024-08-21 NOTE — Progress Notes (Signed)
 Pt is off the floor currently. Unable to admin breathing tx.

## 2024-08-21 NOTE — Progress Notes (Signed)
 Peripherally Inserted Central Catheter Placement  The IV Nurse has discussed with the patient and/or persons authorized to consent for the patient, the purpose of this procedure and the potential benefits and risks involved with this procedure.  The benefits include less needle sticks, lab draws from the catheter, and the patient may be discharged home with the catheter. Risks include, but not limited to, infection, bleeding, blood clot (thrombus formation), and puncture of an artery; nerve damage and irregular heartbeat and possibility to perform a PICC exchange if needed/ordered by physician.  Alternatives to this procedure were also discussed.  Bard Power PICC patient education guide, fact sheet on infection prevention and patient information card has been provided to patient /or left at bedside.    PICC Placement Documentation  PICC Triple Lumen 08/21/24 Right Brachial 41 cm 0 cm (Active)  Indication for Insertion or Continuance of Line Prolonged intravenous therapies 08/21/24 1849  Exposed Catheter (cm) 0 cm 08/21/24 1849  Site Assessment Clean, Dry, Intact 08/21/24 1849  Lumen #1 Status Flushed;Blood return noted;Saline locked 08/21/24 1849  Lumen #2 Status Flushed;Blood return noted;Saline locked 08/21/24 1849  Lumen #3 Status Flushed;Blood return noted;Saline locked 08/21/24 1849  Dressing Type Transparent 08/21/24 1849  Dressing Status Antimicrobial disc/dressing in place 08/21/24 1849  Line Care Connections checked and tightened 08/21/24 1849  Line Adjustment (NICU/IV Team Only) No 08/21/24 1849  Dressing Intervention New dressing 08/21/24 1849  Dressing Change Due 08/28/24 08/21/24 1849       Aron Maryalice Dolores 08/21/2024, 6:51 PM

## 2024-08-21 NOTE — Plan of Care (Signed)

## 2024-08-21 NOTE — Telephone Encounter (Signed)
 Pharmacy Patient Advocate Encounter  Insurance verification completed.    The patient is insured through [facility] Patient has Medicare and is not eligible for a copay card, but may be able to apply for patient assistance or Medicare RX Payment Plan (Patient Must reach out to their plan, if eligible for payment plan), if available.    Ran test claim for Farxiga  10mg  and the current 30 day co-pay is $449.43.  Ran test claim for Jardiance  10mg  and the current 30 day co-pay is $449.43.  Ran test claim for Entresto  24-26mg  and the current 30 day co-pay is $449.43   This test claim was processed through [facility]- copay amounts may vary at other pharmacies due to pharmacy/plan contracts, or as the patient moves through the different stages of their insurance plan.

## 2024-08-21 NOTE — H&P (View-Only) (Signed)
 Cardiology Consultation:  Patient ID: Antonio Payne MRN: [id number]; DOB: 1955/10/07  Admit date: 08/17/2024 Date of Consult: 08/21/2024  Primary Care Provider: Freddrick, No Primary Cardiologist: Alvan Carrier, MD  Primary Electrophysiologist:  None   Patient Profile:   68 y.o. male with a history of metastatic non-small cell lung adenocarcinoma on Keytruda  (diagnosed in 2020, follows with Dr. Davonna), HFrEF with severe RV dysfunction (EF less than 20% in 08/06/2024), COPD, hypothyroidism, adrenal insufficiency, pericardial effusion s/p window about 6 years ago who presented initially to [facility] with complaints of increased shortness of breath and leg edema x 1 month admitted with acute heart failure exacerbation.  He had been on Lasix  with improvement in symptoms over that month.  At time however ran out of his diuretics several days prior to admission.  In the ED he was tachycardic, normotensive with elevated BNP 12562.  Given Lasix  in the ER and cardiology was consulted on 08/18/2024 and was diagnosed with acute severe biventricular heart failure.  After discussion with oncology, it was not felt that his cancer prognosis would prohibit aggressive heart failure management including possible catheterization and was transferred to [facility] on 11/19 to undergo [facility].  Sitting at the edge of the bed today. [name] he was a little short of breath earlier but does think he can lay flat and tolerate the anticipated catheterization.   Past Medical History: Past Medical History:  Diagnosis Date   Cancer (HCC)    lung cancer    CHF (congestive heart failure) (HCC)    COPD (chronic obstructive pulmonary disease) (HCC)    Dyspnea    Hypertension    Hypothyroidism     Past Surgical History: Past Surgical History:  Procedure Laterality Date   BACK SURGERY       Allergies:    No Known Allergies  Social History:   Social History   Socioeconomic History   Marital status: Single     Spouse name: Not on file   Number of children: 1   Years of education: Not on file   Highest education level: Not on file  Occupational History   Not on file  Tobacco Use   Smoking status: Former    Current packs/day: 0.00    Types: Cigarettes    Quit date: 12/01/2019    Years since quitting: 4.7    Passive exposure: Past   Smokeless tobacco: Never  Vaping Use   Vaping status: Never Used  Substance and Sexual Activity   Alcohol use: Never   Drug use: Never   Sexual activity: Not Currently    Birth control/protection: Abstinence  Other Topics Concern   Not on file  Social History Narrative   Patient states that he lives at home with his son, daughter in law and 81 year old granddaughter. Pt states that he is independent with ADL's and doesn't require any type of assistive devices. Pt states that he has no issues getting to and from the doctor, however, he has had an issue getting his doctor to call in his prescribed diuretic which in turn, has resulted in his legs and feet swelling to the point of weeping which is what brought him into the hospital today. Pt states he has a great support network within his home.   Social Drivers of Corporate Investment Banker Strain: Low Risk  (12/06/2020)   Overall Financial Resource Strain (CARDIA)    Difficulty of Paying Living Expenses: Not hard at all  Food Insecurity: No  Food Insecurity (08/17/2024)   Hunger Vital Sign    Worried About Running Out of Food in the Last Year: Never true    Ran Out of Food in the Last Year: Never true  Transportation Needs: No Transportation Needs (08/17/2024)   PRAPARE - Administrator, Civil Service (Medical): No    Lack of Transportation (Non-Medical): No  Physical Activity: Sufficiently Active (12/06/2020)   Exercise Vital Sign    Days of Exercise per Week: 7 days    Minutes of Exercise per Session: 30 min  Stress: No Stress Concern Present (12/06/2020)   [name] of Occupational Health -  Occupational Stress Questionnaire    Feeling of Stress : Only a little  Social Connections: Moderately Isolated (08/17/2024)   Social Connection and Isolation Panel    Frequency of Communication with Friends and Family: More than three times a week    Frequency of Social Gatherings with Friends and Family: More than three times a week    Attends Religious Services: 1 to 4 times per year    Active Member of [employer] or Organizations: No    Attends Banker Meetings: Patient declined    Marital Status: Divorced  Catering Manager Violence: Not At Risk (08/17/2024)   Humiliation, Afraid, Rape, and Kick questionnaire    Fear of Current or Ex-Partner: No    Emotionally Abused: No    Physically Abused: No    Sexually Abused: No     Family History:   History reviewed. No pertinent family history.   ROS:  All other ROS reviewed and negative. Pertinent positives noted in the HPI.     Physical Exam/Data:   Vitals:   08/20/24 2341 08/21/24 0040 08/21/24 0304 08/21/24 0523  BP: 100/72 111/75 106/77   Pulse: (!) 121 (!) 109 100   Resp: 18 18    Temp: 98.2 F (36.8 C) 98.5 F (36.9 C) 97.7 F (36.5 C)   TempSrc: Oral Oral Oral   SpO2: 98% 96% 97%   Weight:  69.3 kg  69.3 kg  Height:  5' 9 (1.753 m)      Intake/Output Summary (Last 24 hours) at 08/21/2024 0720 Last data filed at 08/21/2024 0105 Gross per 24 hour  Intake 0 ml  Output --  Net 0 ml       08/21/2024    5:23 AM 08/21/2024   12:40 AM 08/20/2024    4:04 AM  Last 3 Weights  Weight (lbs) 152 lb 12.5 oz 152 lb 11.2 oz 148 lb 9.4 oz  Weight (kg) 69.3 kg 69.264 kg 67.4 kg    Body mass index is 22.56 kg/m.  Physical Exam Vitals and nursing note reviewed.  Constitutional:      Appearance: Normal appearance.  HENT:     Head: Normocephalic and atraumatic.  Eyes:     Conjunctiva/sclera: Conjunctivae normal.  Neck:     Comments: JVD assessment difficult due to beard Cardiovascular:     Rate and Rhythm:  Regular rhythm. Tachycardia present.  Pulmonary:     Effort: Pulmonary effort is normal.     Breath sounds: Rales present.  Musculoskeletal:     Comments: 3+ bilateral lower extremity edema  Skin:    Coloration: Skin is not jaundiced or pale.  Neurological:     Mental Status: He is alert.      EKG:  The EKG was personally reviewed and demonstrates:  NSR Telemetry:  Telemetry was personally reviewed and demonstrates:  sinus tachycardia  Relevant CV Studies:  Echo 08/06/2024 showed EF <20%, severely decreased LV function, global hypokinesis, G1 DD, elevated LVEDP, C/W RV pressure and volume overload, severely reduced RV function, severely elevated PASP with RVSP 61.7 mmHg, abnormal global longitudinal strain at -3.1%, severely dilated RA, mild to moderately dilated LA, mild MV regurgitation, moderate to severe TV regurgitation   Assessment and Plan:  Acute biventricular failure - volume is improved compared to admission but still very volume overloaded. Has not received lasix since 11/18 and was started on pre cath IV fluids today. Weight is up from yesterday. Will stop IV fluids. He will need further diuresis but he is going for his [facility] soon. Would recommend diuresis following cath. Holding HF GDMT for now to prevent renal dysfunction as he is going to get a contrast load. If LHC is unremarkable, then would consider CMR in the setting of Pembrolizumab , no overt signs of myocarditis. Informed Consent   Shared Decision Making/Informed Consent The risks [stroke (1 in 1000), death (1 in 1000), kidney failure [usually temporary] (1 in 500), bleeding (1 in 200), allergic reaction [possibly serious] (1 in 200)], benefits (diagnostic support and management of coronary artery disease) and alternatives of a cardiac catheterization were discussed in detail with Antonio Payne and he is willing to proceed.    Metastatic lung CA COPD Tobacco abuse     For questions or updates, please contact [facility] Please consult [email or web address] for contact info under     Time Spent with Patient: I have spent a total of 65 minutes caring for this patient today face to face, ordering and reviewing labs/tests, reviewing prior records/medical history, examining the patient, establishing an assessment and plan, communicating results/findings to the patient/family, and documenting in the medical record.   Bonney Emeline Calender, DO [place]  [facility]  08/21/2024 7:20 AM

## 2024-08-21 NOTE — Assessment & Plan Note (Addendum)
-   presented with SOB/DOE.  Workup notable for cardiomegaly, LE edema, small pleural effusion - initial pro-BNP on admission 12,562 - s/p IV lasix  at [facility] - s/p R/L heart cath on 08/21/24 at [facility]. NICM noted. Severely elevated PA psi and R/L heart psi. Mod/sev reduced CO - EF < 20% on echo; Gr 1 DD. Dilated RA, LA. RV sys function severely reduced and enlarged RV - advanced CHF team consulted post cath and managing diuresis  - s/p PICC for coox/CVP monitoring  - continued on GDMT per cardiology at discharge; outpt follow up arranged

## 2024-08-22 ENCOUNTER — Other Ambulatory Visit (HOSPITAL_COMMUNITY): Payer: Self-pay

## 2024-08-22 ENCOUNTER — Other Ambulatory Visit (HOSPITAL_COMMUNITY): Payer: Self-pay | Admitting: Emergency Medicine

## 2024-08-22 ENCOUNTER — Inpatient Hospital Stay (HOSPITAL_COMMUNITY)

## 2024-08-22 DIAGNOSIS — I5041 Acute combined systolic (congestive) and diastolic (congestive) heart failure: Secondary | ICD-10-CM | POA: Diagnosis not present

## 2024-08-22 DIAGNOSIS — J449 Chronic obstructive pulmonary disease, unspecified: Secondary | ICD-10-CM | POA: Diagnosis not present

## 2024-08-22 LAB — BASIC METABOLIC PANEL WITH GFR
Anion gap: 17 — ABNORMAL HIGH (ref 5–15)
BUN: 29 mg/dL — ABNORMAL HIGH (ref 8–23)
CO2: 26 mmol/L (ref 22–32)
Calcium: 8.9 mg/dL (ref 8.9–10.3)
Chloride: 94 mmol/L — ABNORMAL LOW (ref 98–111)
Creatinine, Ser: 1.1 mg/dL (ref 0.61–1.24)
GFR, Estimated: >60 mL/min (ref >60)
Glucose, Bld: 96 mg/dL (ref 70–99)
Potassium: 4 mmol/L (ref 3.5–5.1)
Sodium: 137 mmol/L (ref 135–145)

## 2024-08-22 LAB — MAGNESIUM: Magnesium: 2.3 mg/dL (ref 1.7–2.4)

## 2024-08-22 LAB — CBC
HCT: 37.9 % — ABNORMAL LOW (ref 39.0–52.0)
Hemoglobin: 12.4 g/dL — ABNORMAL LOW (ref 13.0–17.0)
MCH: 31.7 pg (ref 26.0–34.0)
MCHC: 32.7 g/dL (ref 30.0–36.0)
MCV: 96.9 fL (ref 80.0–100.0)
Platelets: 239 K/uL (ref 150–400)
RBC: 3.91 MIL/uL — ABNORMAL LOW (ref 4.22–5.81)
RDW: 13.5 % (ref 11.5–15.5)
WBC: 5.4 K/uL (ref 4.0–10.5)
nRBC: 0 % (ref 0.0–0.2)

## 2024-08-22 LAB — COOXEMETRY PANEL
Carboxyhemoglobin: 1.8 % — ABNORMAL HIGH (ref 0.5–1.5)
Methemoglobin: <0.7 % (ref 0.0–1.5)
O2 Saturation: 68.2 %
Total hemoglobin: 11.9 g/dL — ABNORMAL LOW (ref 12.0–16.0)

## 2024-08-22 MED ORDER — IPRATROPIUM-ALBUTEROL 0.5-2.5 (3) MG/3ML IN SOLN
3.0000 mL | Freq: Three times a day (TID) | RESPIRATORY_TRACT | Status: DC
  Administered 2024-08-22 – 2024-08-25 (×7): 3 mL via RESPIRATORY_TRACT
  Filled 2024-08-22 (×7): qty 3

## 2024-08-22 MED ORDER — ROSUVASTATIN CALCIUM 20 MG PO TABS
20.0000 mg | ORAL_TABLET | Freq: Every day | ORAL | Status: DC
  Administered 2024-08-22 – 2024-08-26 (×5): 20 mg via ORAL
  Filled 2024-08-22 (×5): qty 1

## 2024-08-22 MED ORDER — LORAZEPAM 2 MG/ML IJ SOLN
2.0000 mg | Freq: Once | INTRAMUSCULAR | Status: AC | PRN
  Administered 2024-08-22: 2 mg via INTRAVENOUS
  Filled 2024-08-22: qty 1

## 2024-08-22 MED ORDER — SPIRONOLACTONE 12.5 MG HALF TABLET
12.5000 mg | ORAL_TABLET | Freq: Every day | ORAL | Status: DC
  Administered 2024-08-22 – 2024-08-25 (×4): 12.5 mg via ORAL
  Filled 2024-08-22 (×4): qty 1

## 2024-08-22 MED ORDER — FUROSEMIDE 10 MG/ML IJ SOLN
100.0000 mg | Freq: Once | INTRAVENOUS | Status: AC
  Administered 2024-08-22: 100 mg via INTRAVENOUS
  Filled 2024-08-22: qty 10

## 2024-08-22 NOTE — Progress Notes (Addendum)
 Advanced Heart Failure Rounding Note  Cardiologist: Alvan Carrier, MD  Chief Complaint: Acute BiV HF  Subjective:    Diuresed well with 100 IV lasix  BID yesterday. -5L. CVP not hooked up yet. Weight down 4lbs.   Co-ox 68%.   Feels fine this morning. Was unable to complete cMRI yesterday as he had difficulty holding his breath several times in a row.  Objective:   Weight Range: 67.4 kg Body mass index is 21.94 kg/m.   Vital Signs:   Temp:  [97.5 F (36.4 C)-98 F (36.7 C)] 97.5 F (36.4 C) (11/21 0428) Pulse Rate:  [0-112] 92 (11/21 0428) Resp:  [15-21] 19 (11/21 0428) BP: (102-130)/(69-97) 108/79 (11/21 0428) SpO2:  [82 %-100 %] 98 % (11/21 0428) Weight:  [67.4 kg] 67.4 kg (11/21 0300) Last BM Date : 08/20/24  Weight change: Filed Weights   08/21/24 0040 08/21/24 0523 08/22/24 0300  Weight: 69.3 kg 69.3 kg 67.4 kg    Intake/Output:   Intake/Output Summary (Last 24 hours) at 08/22/2024 0719 Last data filed at 08/22/2024 0300 Gross per 24 hour  Intake 309.67 ml  Output 5000 ml  Net -4690.33 ml    Physical Exam  General:  elderly appearing.  No respiratory difficulty Neck: JVD difficult to see.  Cor: Tachy/regular rate & rhythm. No murmurs. Lungs: insp/exp wheezes Extremities: trace BLE edema. + UNNA boots. PICC RUE Neuro: alert & oriented x 3. Affect pleasant.  Telemetry   ST 100-low 110s (Personally reviewed)    Labs    CBC Recent Labs    08/21/24 0302 08/21/24 1026 08/21/24 1029 08/22/24 0246  WBC 5.3  --   --  5.4  HGB 11.2*   < > 11.2* 12.4*  HCT 34.9*   < > 33.0* 37.9*  MCV 98.9  --   --  96.9  PLT 244  --   --  239   < > = values in this interval not displayed.   Basic Metabolic Panel Recent Labs    88/79/74 0302 08/21/24 0645 08/21/24 1026 08/21/24 1029 08/22/24 0246  NA 132* 136   < > 133* 137  K 4.5 4.5   < > 4.5 4.0  CL 94* 94*  --   --  94*  CO2 27 27  --   --  26  GLUCOSE 102* 92  --   --  96  BUN 26* 23  --   --   29*  CREATININE 1.12 0.94  --   --  1.10  CALCIUM  8.8* 8.8*  --   --  8.9  MG 2.1  --   --   --  2.3   < > = values in this interval not displayed.   Liver Function Tests No results for input(s): AST, ALT, ALKPHOS, BILITOT, PROT, ALBUMIN in the last 72 hours. No results for input(s): LIPASE, AMYLASE in the last 72 hours. Cardiac Enzymes No results for input(s): CKTOTAL, CKMB, CKMBINDEX, TROPONINI in the last 72 hours.  BNP: BNP (last 3 results) No results for input(s): BNP in the last 8760 hours.  ProBNP (last 3 results) Recent Labs    07/16/24 1025 08/17/24 1737  PROBNP 6,828.0* 12,562.0*     D-Dimer No results for input(s): DDIMER in the last 72 hours. Hemoglobin A1C Recent Labs    08/21/24 1133  HGBA1C 5.4   Fasting Lipid Panel Recent Labs    08/21/24 0645  CHOL 175  HDL 65  LDLCALC 91  TRIG 95  CHOLHDL  2.7   Thyroid  Function Tests Recent Labs    08/20/24 0458  TSH 2.630    Other results:  Imaging   CARDIAC CATHETERIZATION Result Date: 08/21/2024   Prox RCA to Mid RCA lesion is 20% stenosed.   Prox Cx lesion is 30% stenosed.   Mid LAD lesion is 40% stenosed with 40% stenosed side branch in 2nd Diag.   1st Diag lesion is 20% stenosed.   Prox LAD to Mid LAD lesion is 25% stenosed.   LV end diastolic pressure is severely elevated.   There is no aortic valve stenosis.   Anticipated discharge date to be determined.   Recommend Aspirin  81mg  daily for moderate CAD. Conclusions: Mild to moderate, non-obstructive coronary artery disease.  Findings are consistent with nonischemic cardiomyopathy. Severely elevated left heart, right heart, and pulmonary artery pressures. Moderately to severely reduced Fick cardiac output/index. Recommendations: Resume diuresis and initiate goal-directed medical therapy as tolerated. Consider advanced heart failure consultation. Medical therapy and risk factor modification to prevent progression of CAD.  Lonni Hanson, MD [facility]  US  EKG SITE RITE Result Date: 08/21/2024 If Site Rite image not attached, placement could not be confirmed due to current cardiac rhythm.   Medications:    Scheduled Medications:  arformoterol   15 mcg Nebulization BID   budesonide  (PULMICORT ) nebulizer solution  0.25 mg Nebulization BID   Chlorhexidine  Gluconate Cloth  6 each Topical Daily   digoxin   0.125 mg Oral Daily   enoxaparin  (LOVENOX ) injection  40 mg Subcutaneous Q24H   guaiFENesin   1,200 mg Oral BID   ipratropium-albuterol   3 mL Nebulization QID   levothyroxine   100 mcg Oral Q0600   losartan   25 mg Oral Daily   sodium chloride  flush  10-40 mL Intracatheter Q12H   sodium chloride  flush  3 mL Intravenous Q12H    Infusions:  sodium chloride       PRN Medications: sodium chloride , acetaminophen  **OR** acetaminophen , ipratropium-albuterol , ondansetron  **OR** ondansetron  (ZOFRAN ) IV, sodium chloride  flush, sodium chloride  flush  Patient Profile   Antonio Payne is a 68 y.o. 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 AHF team to see for Acute BiV HF with low output state.   Assessment/Plan   Acute BiV HF with low output, suspected keytruda  toxicity - Echo 8/20: EF 58%, RV normal, no  - Echo 11/20: EF 45%-47%, RV normal, trace MR/TR - Echo 08/06/24 showed EF <20%, LV with GHK, RV severely reduced, LA mild-mod dilated, RA severely dilated, mild MR, mod-severe TR. - L/RHC today with mild-mod non-obs CAD. Severely elevated L/R filling pressures. Mod-severely reduced fick CO/CI. RA 15, PA 63/32 (42), PCWP 30, PA 50%, Fick CO/CI 3.3/1.8. - Concern for Keytruda  induced toxicity/myocarditis/ HF - NYHA IV on admission - Still slightly overloaded on exam. Needs CVP set up, discussed with RN. Will do another dose  of lasix  100 mg x1 this morning.  - Co-ox 68% - Continue digoxin  0.125 mg daily - GDMT as tolerated.  - Continue losartan  12.5 mg daily - start spiro 12.5 mg daily - A1c 5.4. Will check coverage for SGLT2i - cMRI today to check for prior myocarditis. Had trouble completing yesterday.  - CRP 0.9, ESR 11 - Has been off Keytruda  for ~12 weeks now. Stopped 10/25.  - Continue UNNA boots  - Strict I&O, daily weights    Metastatic non-small cell lung adenocarcinoma -  Mets to brain and lymph nodes - Follows with Dr. Davonna.  - Off Keytruda  now for ~12 weeks. - With acute systolic HF would hold off on restarting Keytruda . There was a drop on initial echo after starting back in 2020, I see no other echos after that.    CAD - LHC today with mild-mod non-obs CAD - Denies CP - LDL 91. Start statin   COPD - per primary team  Length of Stay: 5  Beckey LITTIE Coe, NP  08/22/2024, 7:19 AM  Advanced Heart Failure Team Pager [phone] (M-F; 7a - 5p)  Please contact [facility] for night-coverage after hours (5p -7a ) and weekends on [email or web address]  Patient seen and examined with the above-signed Advanced Practice Provider and/or Housestaff. I personally reviewed laboratory data, imaging studies and relevant notes. I independently examined the patient and formulated the important aspects of the plan. I have edited the note to reflect any of my changes or salient points. I have personally discussed the plan with the patient and/or family.  Diuresing fairly well.  Co-ox ok. CVP low but remains SOB. Unable to tolerate cMRI today due to SOB (unclear HF +/- anxiety)   General:  Sitting up in bed. No resp difficulty HEENT: normal Neck: supple. no JVD.  Cor: Regular rate & rhythm. No rubs, gallops or murmurs. Lungs: clear Abdomen: soft, nontender, nondistended.Good bowel sounds. Extremities: no cyanosis, clubbing, rash, 2+ edema + UNNA Neuro: alert & orientedx3, cranial nerves grossly intact. moves  all 4 extremities w/o difficulty. Affect pleasant  Remains volume overloaded. Continue IV lasix . Add spiro/dig.   Suspect Keytruda  toxicity. Attempt cMRI Monday. Titrate GDMT as tolerated  Toribio Fuel, MD  11:41 AM

## 2024-08-22 NOTE — Progress Notes (Signed)
 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 Cardiology was consulted as well on admission.  He was treated for CHF exacerbation and started on diuresis with IV Lasix . He was transferred to [facility] for right and left heart cath, performed on 08/21/2024.  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  He had elevated pressures in left, right heart and pulmonary artery.  Moderate to severely reduced cardiac output.  Advanced CHF team consulted afterwards.  Interval History:  No events overnight.  Diuresed well yesterday. PICC placed.  Wheezing a little better today. No big respi issues. Nebs helping.   Assessment and Plan: * Acute combined systolic and diastolic CHF, NYHA class 2 (HCC) - presented with SOB/DOE.  Workup notable for cardiomegaly, LE edema, small pleural effusion - initial pro-BNP on admission 12,562 - s/p IV lasix  at [facility] - s/p R/L heart cath on 08/21/24 at [facility]. NICM noted. Severely elevated PA psi and R/L heart psi. Mod/sev reduced CO - EF < 20% on echo; Gr 1 DD. Dilated RA, LA. RV sys function severely reduced and enlarged RV - advanced CHF team consulted post cath and managing diuresis  - now has PICC in place for coox/CVP monitoring  - Follow-up cardiac MRI  Adenocarcinoma of lung, stage 4, right (HCC) - Follows with oncology.  Has been treated with single agent pembrolizumab  and currently noted to be on treatment break - Per oncology, also noted to have responded well with treatment and prognosis does not limit CHF treatment  Hypothyroidism - TSH normal on admission, 2.630 - Continue Synthroid   HTN  (hypertension) - Currently controlled.  Regimen likely being modified with GDMT as per cardiology  COPD (chronic obstructive pulmonary disease) (HCC) - Some wheezing on exam but no significant exacerbation.  No change in his cough or sputum - Continue DuoNebs scheduled and as needed for now -Continue budesonide  and Brovana  nebs - Adding Mucinex  as well   Antimicrobials:   DVT prophylaxis:  enoxaparin  (LOVENOX ) injection 40 mg Start: 08/21/24 2200 Place TED hose Start: 08/18/24 1218 SCDs Start: 08/17/24 2219   Code Status:   Code Status: Full Code  Mobility Assessment (Last 72 Hours)     Mobility Assessment     Row Name 08/21/24 2100 08/21/24 0844 08/21/24 0140 08/20/24 1932 08/20/24 0800   Does the patient have exclusion criteria? No - Perform mobility assessment No - Perform mobility assessment No - Perform mobility assessment No - Perform mobility assessment No - Perform mobility assessment   What is the highest level of mobility based on the mobility assessment? Level 5 (Ambulates independently) - Balance while walking independently - Complete Level 5 (Ambulates independently) - Balance while walking independently - Complete Level 5 (Ambulates independently) - Balance while walking independently - Complete Level 5 (Ambulates independently) - Balance while walking independently - Complete Level 5 (Ambulates independently) - Balance while walking independently - Complete    Row Name 08/19/24 1944           Does the patient have exclusion criteria? No - Perform mobility assessment       What is the  highest level of mobility based on the mobility assessment? Level 5 (Ambulates independently) - Balance while walking independently - Complete          Diet: Diet Orders (From admission, onward)     Start     Ordered   08/21/24 1107  Diet Heart Room service appropriate? Yes; Fluid consistency: Thin; Fluid restriction: 1500 mL Fluid  Diet effective now       Question Answer  Comment  Room service appropriate? Yes   Fluid consistency: Thin   Fluid restriction: 1500 mL Fluid      08/21/24 1106            Barriers to discharge: none Disposition Plan:  Home HH orders placed: TBD Status is: Inpt  Objective: Blood pressure 110/74, pulse (!) 105, temperature 97.8 F (36.6 C), temperature source Oral, resp. rate 18, height 5' 9 (1.753 m), weight 67.4 kg, SpO2 94%.  Examination:  Physical Exam Constitutional:      General: He is not in acute distress.    Appearance: Normal appearance.  HENT:     Head: Normocephalic and atraumatic.     Mouth/Throat:     Mouth: Mucous membranes are moist.  Eyes:     Extraocular Movements: Extraocular movements intact.  Cardiovascular:     Rate and Rhythm: Normal rate and regular rhythm.  Pulmonary:     Effort: Pulmonary effort is normal. No respiratory distress.     Breath sounds: Wheezing present.  Abdominal:     General: Bowel sounds are normal. There is no distension.     Palpations: Abdomen is soft.     Tenderness: There is no abdominal tenderness.  Musculoskeletal:        General: Normal range of motion.     Cervical back: Normal range of motion and neck supple.     Right lower leg: Edema (2+) present.     Left lower leg: Edema (2+) present.     Comments: Unna boots in place bilaterally  Skin:    General: Skin is warm and dry.  Neurological:     General: No focal deficit present.     Mental Status: He is alert.  Psychiatric:        Mood and Affect: Mood normal.        Behavior: Behavior normal.      Consultants:  Cardiology/advanced CHF  Procedures:  R/L heart cath: 08/21/24  Data Reviewed: Results for orders placed or performed during the hospital encounter of 08/17/24 (from the past 24 hours)  Sedimentation rate     Status: None   Collection Time: 08/21/24 11:33 AM  Result Value Ref Range   Sed Rate 11 0 - 16 mm/hr  C-reactive protein     Status: None   Collection Time: 08/21/24 11:33  AM  Result Value Ref Range   CRP 0.9 <1.0 mg/dL  Hemoglobin J8r     Status: None   Collection Time: 08/21/24 11:33 AM  Result Value Ref Range   Hgb A1c MFr Bld 5.4 4.8 - 5.6 %   Mean Plasma Glucose 108.28 mg/dL  Cooxemetry Panel (carboxy, met, total hgb, O2 sat)     Status: Abnormal   Collection Time: 08/21/24  7:12 PM  Result Value Ref Range   Total hemoglobin 12.4 12.0 - 16.0 g/dL   O2 Saturation 38.6 %   Carboxyhemoglobin 2.1 (H) 0.5 - 1.5 %   Methemoglobin 1.3 0.0 - 1.5 %  CBC     Status: Abnormal  Collection Time: 08/22/24  2:46 AM  Result Value Ref Range   WBC 5.4 4.0 - 10.5 K/uL   RBC 3.91 (L) 4.22 - 5.81 MIL/uL   Hemoglobin 12.4 (L) 13.0 - 17.0 g/dL   HCT 62.0 (L) 60.9 - 47.9 %   MCV 96.9 80.0 - 100.0 fL   MCH 31.7 26.0 - 34.0 pg   MCHC 32.7 30.0 - 36.0 g/dL   RDW 86.4 88.4 - 84.4 %   Platelets 239 150 - 400 K/uL   nRBC 0.0 0.0 - 0.2 %  Basic metabolic panel with GFR     Status: Abnormal   Collection Time: 08/22/24  2:46 AM  Result Value Ref Range   Sodium 137 135 - 145 mmol/L   Potassium 4.0 3.5 - 5.1 mmol/L   Chloride 94 (L) 98 - 111 mmol/L   CO2 26 22 - 32 mmol/L   Glucose, Bld 96 70 - 99 mg/dL   BUN 29 (H) 8 - 23 mg/dL   Creatinine, Ser 8.89 0.61 - 1.24 mg/dL   Calcium  8.9 8.9 - 10.3 mg/dL   GFR, Estimated >39 >39 mL/min   Anion gap 17 (H) 5 - 15  Magnesium     Status: None   Collection Time: 08/22/24  2:46 AM  Result Value Ref Range   Magnesium 2.3 1.7 - 2.4 mg/dL  Cooxemetry Panel (carboxy, met, total hgb, O2 sat)     Status: Abnormal   Collection Time: 08/22/24  5:50 AM  Result Value Ref Range   Total hemoglobin 11.9 (L) 12.0 - 16.0 g/dL   O2 Saturation 31.7 %   Carboxyhemoglobin 1.8 (H) 0.5 - 1.5 %   Methemoglobin <0.7 0.0 - 1.5 %    I have reviewed pertinent nursing notes, vitals, labs, and images as necessary. I have ordered labwork to follow up on as indicated.  I have reviewed the last notes from staff over past 24 hours. I have discussed  patient's care plan and test results with nursing staff, CM/SW, and other staff as appropriate.  Old records reviewed in assessment of this patient  Time spent: Greater than 50% of the 55 minute visit was spent in counseling/coordination of care for the patient as laid out in the A&P.   LOS: 5 days   Alm Apo, MD [facility] 08/22/2024, 11:06 AM

## 2024-08-22 NOTE — Plan of Care (Signed)
  Problem: Education: Goal: Knowledge of General Education information will improve Description: Including pain rating scale, medication(s)/side effects and non-pharmacologic comfort measures Outcome: Progressing   Problem: Activity: Goal: Risk for activity intolerance will decrease Outcome: Progressing   Problem: Pain Managment: Goal: General experience of comfort will improve and/or be controlled Outcome: Progressing   Problem: Coping: Goal: Level of anxiety will decrease Outcome: Not Progressing

## 2024-08-22 NOTE — TOC Initial Note (Signed)
 Transition of Care [facility] - Initial/Assessment Note    Patient Details  Name: Antonio Payne MRN: [id number] Date of Birth: 1956/01/31  Transition of Care [facility] CM/SW Contact:    [name] Phone Number: [phone] 08/22/2024, 11:23 AM  Clinical Narrative:   11:04 AM- HD CSW attempted to meet with patient at bedside. Patient was being seen by medical staff. CSW/CM will follow up with patient at a more appropriate time.   HF CSW/CM will continue to follow and monitor for dc readiness.                 Expected Discharge Plan: Home/Self Care Barriers to Discharge: Continued Medical Work up   Patient Goals and CMS Choice Patient states their goals for this hospitalization and ongoing recovery are:: Return home CMS Medicare.gov Compare Post Acute Care list provided to:: Patient Choice offered to / list presented to : Patient [place] ownership interest in [facility].provided to:: Patient    Expected Discharge Plan and Services       Living arrangements for the past 2 months: Single Family Home                               Date [facility] Agency Contacted: 08/19/24 Time HH Agency Contacted: [id number] Representative spoke with at [facility] Agency: Darleene  Prior Living Arrangements/Services Living arrangements for the past 2 months: Single Family Home Lives with:: Self Patient language and need for interpreter reviewed:: Yes Do you feel safe going back to the place where you live?: Yes      Need for Family Participation in Patient Care: Yes (Comment) Care giver support system in place?: Yes (comment) Current home services: DME Criminal Activity/Legal Involvement Pertinent to Current Situation/Hospitalization: No - Comment as needed  Activities of Daily Living   ADL Screening (condition at time of admission) Independently performs ADLs?: Yes (appropriate for developmental age) Is the patient deaf or have difficulty hearing?: No Does the patient have difficulty  seeing, even when wearing glasses/contacts?: No Does the patient have difficulty concentrating, remembering, or making decisions?: No  Permission Sought/Granted Permission sought to share information with : Case Manager          Permission granted to share info w Relationship: Daughter     Emotional Assessment     Affect (typically observed): Accepting Orientation: : Oriented to Self, Oriented to Place, Oriented to Situation, Oriented to  Time Alcohol / Substance Use: Not Applicable Psych Involvement: No (comment)  Admission diagnosis:  Acute combined systolic and diastolic CHF, NYHA class 2 (HCC) [I50.41] Patient Active Problem List   Diagnosis Date Noted   COPD (chronic obstructive pulmonary disease) (HCC) 08/21/2024   HTN (hypertension) 08/21/2024   Hypothyroidism 08/21/2024   Acute combined systolic and diastolic CHF, NYHA class 2 (HCC) 08/17/2024   Adenocarcinoma of lung, stage 4, right (HCC) 12/08/2020   PCP:  Pcp, No Pharmacy:   [facility] [phone] - [place], [place] - [address] AT [facility] [address] [place] [address] Phone: [phone] Fax: [phone]     Social Drivers of Health (SDOH) Social History: SDOH Screenings   Food Insecurity: No Food Insecurity (08/17/2024)  Housing: Low Risk  (08/17/2024)  Transportation Needs: No Transportation Needs (08/17/2024)  Utilities: Not At Risk (08/17/2024)  Alcohol Screen: Low Risk  (12/06/2020)  Depression (PHQ2-9): Low Risk  (08/12/2024)  Financial Resource Strain: Low Risk  (12/06/2020)  Physical Activity: Sufficiently Active (12/06/2020)  Social Connections: Moderately Isolated (08/17/2024)  Stress: No Stress Concern Present (12/06/2020)  Tobacco Use: Medium Risk (08/18/2024)   SDOH Interventions:     Readmission Risk Interventions     No data to display

## 2024-08-22 NOTE — Progress Notes (Signed)
 Pt. Disconnected from CVP and transport picked pt. up to go down for cardiac MRI.

## 2024-08-22 NOTE — Progress Notes (Signed)
 Arrived back to 3E11 from Cardiac MRI at this time.  Unsuccessful attempt at MRI. Unable to complete breath holds.

## 2024-08-23 DIAGNOSIS — C3491 Malignant neoplasm of unspecified part of right bronchus or lung: Secondary | ICD-10-CM | POA: Diagnosis not present

## 2024-08-23 DIAGNOSIS — I5021 Acute systolic (congestive) heart failure: Secondary | ICD-10-CM | POA: Diagnosis not present

## 2024-08-23 DIAGNOSIS — I5041 Acute combined systolic (congestive) and diastolic (congestive) heart failure: Secondary | ICD-10-CM | POA: Diagnosis not present

## 2024-08-23 DIAGNOSIS — I2510 Atherosclerotic heart disease of native coronary artery without angina pectoris: Secondary | ICD-10-CM

## 2024-08-23 DIAGNOSIS — I5082 Biventricular heart failure: Secondary | ICD-10-CM | POA: Diagnosis not present

## 2024-08-23 LAB — BASIC METABOLIC PANEL WITH GFR
Anion gap: 12 (ref 5–15)
BUN: 24 mg/dL — ABNORMAL HIGH (ref 8–23)
CO2: 31 mmol/L (ref 22–32)
Calcium: 8.7 mg/dL — ABNORMAL LOW (ref 8.9–10.3)
Chloride: 90 mmol/L — ABNORMAL LOW (ref 98–111)
Creatinine, Ser: 0.89 mg/dL (ref 0.61–1.24)
GFR, Estimated: >60 mL/min (ref >60)
Glucose, Bld: 95 mg/dL (ref 70–99)
Potassium: 3.4 mmol/L — ABNORMAL LOW (ref 3.5–5.1)
Sodium: 133 mmol/L — ABNORMAL LOW (ref 135–145)

## 2024-08-23 LAB — COOXEMETRY PANEL
Carboxyhemoglobin: 1.6 % — ABNORMAL HIGH (ref 0.5–1.5)
Carboxyhemoglobin: 2.1 % — ABNORMAL HIGH (ref 0.5–1.5)
Methemoglobin: <0.7 % (ref 0.0–1.5)
Methemoglobin: <0.7 % (ref 0.0–1.5)
O2 Saturation: 61.4 %
O2 Saturation: 65.4 %
Total hemoglobin: 13.3 g/dL (ref 12.0–16.0)
Total hemoglobin: 13.8 g/dL (ref 12.0–16.0)

## 2024-08-23 LAB — CBC
HCT: 38.2 % — ABNORMAL LOW (ref 39.0–52.0)
Hemoglobin: 12.7 g/dL — ABNORMAL LOW (ref 13.0–17.0)
MCH: 31.5 pg (ref 26.0–34.0)
MCHC: 33.2 g/dL (ref 30.0–36.0)
MCV: 94.8 fL (ref 80.0–100.0)
Platelets: 254 K/uL (ref 150–400)
RBC: 4.03 MIL/uL — ABNORMAL LOW (ref 4.22–5.81)
RDW: 13.3 % (ref 11.5–15.5)
WBC: 5.7 K/uL (ref 4.0–10.5)
nRBC: 0 % (ref 0.0–0.2)

## 2024-08-23 LAB — HEPATIC FUNCTION PANEL
ALT: 36 U/L (ref 0–44)
AST: 32 U/L (ref 15–41)
Albumin: 4 g/dL (ref 3.5–5.0)
Alkaline Phosphatase: 102 U/L (ref 38–126)
Bilirubin, Direct: 0.1 mg/dL (ref 0.0–0.2)
Indirect Bilirubin: 0.8 mg/dL (ref 0.3–0.9)
Total Bilirubin: 0.9 mg/dL (ref 0.0–1.2)
Total Protein: 7.2 g/dL (ref 6.5–8.1)

## 2024-08-23 LAB — LIPOPROTEIN A (LPA): Lipoprotein (a): 72.5 nmol/L — ABNORMAL HIGH (ref <75.0)

## 2024-08-23 LAB — MAGNESIUM: Magnesium: 2.2 mg/dL (ref 1.7–2.4)

## 2024-08-23 MED ORDER — POTASSIUM CHLORIDE CRYS ER 20 MEQ PO TBCR
40.0000 meq | EXTENDED_RELEASE_TABLET | Freq: Once | ORAL | Status: AC
  Administered 2024-08-23: 40 meq via ORAL
  Filled 2024-08-23: qty 2

## 2024-08-23 MED ORDER — FUROSEMIDE 10 MG/ML IJ SOLN
80.0000 mg | Freq: Two times a day (BID) | INTRAMUSCULAR | Status: DC
  Administered 2024-08-23 – 2024-08-24 (×3): 80 mg via INTRAVENOUS
  Filled 2024-08-23 (×3): qty 8

## 2024-08-23 NOTE — Progress Notes (Signed)
 Advanced Heart Failure Rounding Note  Cardiologist: Alvan Carrier, MD  Chief Complaint: Acute BiV HF  Subjective:    No complaints with the exception of unable to perform MRI CVP 13 mmHg, Co. ox 61.4% Currently not on Lasix  drip.  Objective:   Weight Range: 63.4 kg Body mass index is 20.64 kg/m.   Vital Signs:   Temp:  [97.5 F (36.4 C)-97.8 F (36.6 C)] 97.7 F (36.5 C) (11/22 0750) Pulse Rate:  [90-109] 99 (11/22 0820) Resp:  [19-20] 19 (11/22 0820) BP: (105-118)/(71-81) 118/81 (11/22 0750) SpO2:  [97 %-100 %] 98 % (11/22 0820) Weight:  [63.4 kg] 63.4 kg (11/22 0300) Last BM Date : 08/20/24  Weight change: Filed Weights   08/21/24 0523 08/22/24 0300 08/23/24 0300  Weight: 69.3 kg 67.4 kg 63.4 kg    Intake/Output:   Intake/Output Summary (Last 24 hours) at 08/23/2024 1226 Last data filed at 08/23/2024 0824 Gross per 24 hour  Intake 930 ml  Output 4410 ml  Net -3480 ml   Net IO Since Admission: -83,135.33 mL [08/23/24 1226]   Physical Exam  General:  elderly appearing. hemodynamically stable Neck: JVD difficult to see.  Cor: Tachy/regular rate & rhythm. No murmurs. Lungs: inspiratory wheezes bilateral, no rales,  Extremities: trace BLE edema. + UNNA boots. PICC RUE Neuro: alert & oriented x 3. Affect pleasant.   Telemetry   ST 100-low 110s (Personally reviewed)    Labs    CBC Recent Labs    08/22/24 0246 08/23/24 0515  WBC 5.4 5.7  HGB 12.4* 12.7*  HCT 37.9* 38.2*  MCV 96.9 94.8  PLT 239 254   Basic Metabolic Panel Recent Labs    88/78/74 0246 08/23/24 0515  NA 137 133*  K 4.0 3.4*  CL 94* 90*  CO2 26 31  GLUCOSE 96 95  BUN 29* 24*  CREATININE 1.10 0.89  CALCIUM  8.9 8.7*  MG 2.3 2.2   Liver Function Tests No results for input(s): AST, ALT, ALKPHOS, BILITOT, PROT, ALBUMIN in the last 72 hours. No results for input(s): LIPASE, AMYLASE in the last 72 hours. Cardiac Enzymes No results for input(s):  CKTOTAL, CKMB, CKMBINDEX, TROPONINI in the last 72 hours.  BNP: BNP (last 3 results) No results for input(s): BNP in the last 8760 hours.  ProBNP (last 3 results) Recent Labs    07/16/24 1025 08/17/24 1737  PROBNP 6,828.0* 12,562.0*   D-Dimer No results for input(s): DDIMER in the last 72 hours. Hemoglobin A1C Recent Labs    08/21/24 1133  HGBA1C 5.4   Fasting Lipid Panel Recent Labs    08/21/24 0645  CHOL 175  HDL 65  LDLCALC 91  TRIG 95  CHOLHDL 2.7   Thyroid  Function Tests No results for input(s): TSH, T4TOTAL, T3FREE, THYROIDAB in the last 72 hours.  Invalid input(s): FREET3   Other results:  Imaging   No results found.   Medications:    Scheduled Medications:  arformoterol   15 mcg Nebulization BID   budesonide  (PULMICORT ) nebulizer solution  0.25 mg Nebulization BID   Chlorhexidine  Gluconate Cloth  6 each Topical Daily   digoxin   0.125 mg Oral Daily   enoxaparin  (LOVENOX ) injection  40 mg Subcutaneous Q24H   furosemide   80 mg Intravenous BID   guaiFENesin   1,200 mg Oral BID   ipratropium-albuterol   3 mL Nebulization TID   levothyroxine   100 mcg Oral Q0600   losartan   25 mg Oral Daily   potassium chloride   40 mEq Oral Once  rosuvastatin   20 mg Oral Daily   sodium chloride  flush  10-40 mL Intracatheter Q12H   sodium chloride  flush  3 mL Intravenous Q12H   spironolactone   12.5 mg Oral Daily    Infusions:    PRN Medications: acetaminophen  **OR** acetaminophen , ipratropium-albuterol , ondansetron  **OR** ondansetron  (ZOFRAN ) IV, sodium chloride  flush, sodium chloride  flush  Patient Profile   Antonio Payne is a 68 y.o. 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 AHF team to see for Acute BiV HF with low output state.   Assessment/Plan    Acute BiV HF with low output, suspected keytruda  toxicity - Echo 8/20: EF 58%, RV normal, no  - Echo 11/20: EF 45%-47%, RV normal, trace MR/TR - Echo 08/06/24 showed EF <20%, LV with GHK, RV severely reduced, LA mild-mod dilated, RA severely dilated, mild MR, mod-severe TR. - L/RHC 08/21/2024 with mild-mod non-obs CAD. Severely elevated L/R filling pressures. Mod-severely reduced fick CO/CI. RA 15, PA 63/32 (42), PCWP 30, PA 50%, Fick CO/CI 3.3/1.8. - Concern for Keytruda  induced toxicity/myocarditis/ HF - Has been off Keytruda  for ~12 weeks now. Stopped 10/25.  - NYHA IV on admission - CVP 13 mmHg, Co-ox 61.4%, BP stable, Cr stable as well. Will start LV lasix  80mg  IVP bid  - Check Co-ox later today  - Check Co-ox, CVP, lactic acid, BNP and hepatic panel in am. Sooner if needed.  - will hold off on inotrope  - Co-ox 68%->61% - Continue digoxin  0.125 mg daily - GDMT as tolerated.  - Continue losartan  12.5 mg daily - Continue spiro 12.5 mg daily - A1c 5.4. Will check coverage for SGLT2i - cMRI pending  - difficultly with breath holds. Re-attempt on Monday  - CRP 0.9, ESR 11 - Continue UNNA boots  - Strict I&O, daily weights    Metastatic non-small cell lung adenocarcinoma - Mets to brain and lymph nodes - Follows with Dr. Davonna.  - Off Keytruda  now for ~12 weeks. - With acute systolic HF and severely reduced LVEF would hold off on restarting Keytruda .  - Will need to discuss with AHF team, Oncology once euvolemic and LVEF is re-assessed.    CAD - LHC today with mild-mod non-obs CAD - Denies CP - LPA 72.5 - LDL 91. Start statin   COPD - per primary team  Plan discussed with RN and pharmacy post rounding.   Length of Stay: 6  Blondine Hottel [place], DO  08/23/2024, 12:26 PM

## 2024-08-23 NOTE — Plan of Care (Signed)
  Problem: Clinical Measurements: Goal: Will remain free from infection Outcome: Progressing Goal: Respiratory complications will improve Outcome: Progressing Goal: Cardiovascular complication will be avoided Outcome: Progressing   Problem: Elimination: Goal: Will not experience complications related to urinary retention Outcome: Progressing   Problem: Safety: Goal: Ability to remain free from injury will improve Outcome: Progressing   

## 2024-08-23 NOTE — Progress Notes (Signed)
 Progress Note    Antonio Payne   FMW:[id number]  DOB: 04/10/1956  DOA: 08/17/2024     6 PCP: Pcp, No  Initial CC: SOB, LE edema   Hospital Course: 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 Cardiology was consulted as well on admission.  He was treated for CHF exacerbation and started on diuresis with IV Lasix . He was transferred to [facility] for right and left heart cath, performed on 08/21/2024.  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  He had elevated pressures in left, right heart and pulmonary artery.  Moderate to severely reduced cardiac output.  Advanced CHF team consulted afterwards.  Interval History:  No events overnight.  Diuresing well. Net neg ~ 17 L so far.  Seemed to be aggravated this morning that he is still in the hospital.  Tried explaining to him the complexity of treatment and hospitalization currently.  Assessment and Plan: * Acute combined systolic and diastolic CHF, NYHA class 2 (HCC) - presented with SOB/DOE.  Workup notable for cardiomegaly, LE edema, small pleural effusion - initial pro-BNP on admission 12,562 - s/p IV lasix  at [facility] - s/p R/L heart cath on 08/21/24 at [facility]. NICM noted. Severely elevated PA psi and R/L heart psi. Mod/sev reduced CO - EF < 20% on echo; Gr 1 DD. Dilated RA, LA. RV sys function severely reduced and enlarged RV - advanced CHF team consulted post cath and managing diuresis  - now has PICC in place for coox/CVP monitoring  - Follow-up cardiac MRI; tentative for Monday as unsuccessful at holding breath on Friday - further GDMT per cardiology as well   Adenocarcinoma of lung, stage 4, right (HCC) - Follows with oncology.  Has been treated with single agent pembrolizumab  and currently noted to be on treatment  break - Per oncology, also noted to have responded well with treatment and prognosis does not limit CHF treatment - Keytruda  has been recommended to be held per cardiology and contingent on further discussion with oncology on treatment regimen going forward  Hypothyroidism - TSH normal on admission, 2.630 - Continue Synthroid   HTN (hypertension) - Currently controlled.  Regimen likely being modified with GDMT as per cardiology  COPD (chronic obstructive pulmonary disease) (HCC) - Some wheezing on exam but no significant exacerbation.  No change in his cough or sputum - Continue DuoNebs scheduled and as needed for now -Continue budesonide  and Brovana  nebs - Adding Mucinex  as well   Antimicrobials:   DVT prophylaxis:  enoxaparin  (LOVENOX ) injection 40 mg Start: 08/21/24 2200 Place TED hose Start: 08/18/24 1218 SCDs Start: 08/17/24 2219   Code Status:   Code Status: Full Code  Mobility Assessment (Last 72 Hours)     Mobility Assessment     Row Name 08/23/24 0800 08/22/24 2100 08/22/24 0720 08/21/24 2100 08/21/24 0844   Does the patient have exclusion criteria? No - Perform mobility assessment No - Perform mobility assessment No - Perform mobility assessment No - Perform mobility assessment No - Perform mobility assessment   What is the highest level of mobility based on the mobility assessment? Level 5 (Ambulates independently) - Balance while walking independently - Complete Level 5 (Ambulates independently) - Balance while walking independently - Complete Level 5 (Ambulates independently) - Balance while walking independently - Complete Level 5 (  Ambulates independently) - Balance while walking independently - Complete Level 5 (Ambulates independently) - Balance while walking independently - Complete    Row Name 08/21/24 0140 08/20/24 1932         Does the patient have exclusion criteria? No - Perform mobility assessment No - Perform mobility assessment      What is the  highest level of mobility based on the mobility assessment? Level 5 (Ambulates independently) - Balance while walking independently - Complete Level 5 (Ambulates independently) - Balance while walking independently - Complete         Diet: Diet Orders (From admission, onward)     Start     Ordered   08/21/24 1107  Diet Heart Room service appropriate? Yes; Fluid consistency: Thin; Fluid restriction: 1500 mL Fluid  Diet effective now       Question Answer Comment  Room service appropriate? Yes   Fluid consistency: Thin   Fluid restriction: 1500 mL Fluid      08/21/24 1106            Barriers to discharge: none Disposition Plan:  Home HH orders placed: TBD Status is: Inpt  Objective: Blood pressure 118/81, pulse 99, temperature 97.7 F (36.5 C), temperature source Oral, resp. rate 19, height 5' 9 (1.753 m), weight 63.4 kg, SpO2 98%.  Examination:  Physical Exam Constitutional:      General: He is not in acute distress.    Appearance: Normal appearance.  HENT:     Head: Normocephalic and atraumatic.     Mouth/Throat:     Mouth: Mucous membranes are moist.  Eyes:     Extraocular Movements: Extraocular movements intact.  Cardiovascular:     Rate and Rhythm: Normal rate and regular rhythm.  Pulmonary:     Effort: Pulmonary effort is normal. No respiratory distress.     Breath sounds: Wheezing present.  Abdominal:     General: Bowel sounds are normal. There is no distension.     Palpations: Abdomen is soft.     Tenderness: There is no abdominal tenderness.  Musculoskeletal:        General: Normal range of motion.     Cervical back: Normal range of motion and neck supple.     Right lower leg: Edema (2+ (improving, now 1-2+)) present.     Left lower leg: Edema (2+ (improving, now 1-2+)) present.     Comments: Unna boots in place bilaterally  Skin:    General: Skin is warm and dry.  Neurological:     General: No focal deficit present.     Mental Status: He is alert.   Psychiatric:        Mood and Affect: Mood normal.        Behavior: Behavior normal.      Consultants:  Cardiology/advanced CHF  Procedures:  R/L heart cath: 08/21/24  Data Reviewed: Results for orders placed or performed during the hospital encounter of 08/17/24 (from the past 24 hours)  CBC     Status: Abnormal   Collection Time: 08/23/24  5:15 AM  Result Value Ref Range   WBC 5.7 4.0 - 10.5 K/uL   RBC 4.03 (L) 4.22 - 5.81 MIL/uL   Hemoglobin 12.7 (L) 13.0 - 17.0 g/dL   HCT 61.7 (L) 60.9 - 47.9 %   MCV 94.8 80.0 - 100.0 fL   MCH 31.5 26.0 - 34.0 pg   MCHC 33.2 30.0 - 36.0 g/dL   RDW 86.6 88.4 - 84.4 %  Platelets 254 150 - 400 K/uL   nRBC 0.0 0.0 - 0.2 %  Basic metabolic panel with GFR     Status: Abnormal   Collection Time: 08/23/24  5:15 AM  Result Value Ref Range   Sodium 133 (L) 135 - 145 mmol/L   Potassium 3.4 (L) 3.5 - 5.1 mmol/L   Chloride 90 (L) 98 - 111 mmol/L   CO2 31 22 - 32 mmol/L   Glucose, Bld 95 70 - 99 mg/dL   BUN 24 (H) 8 - 23 mg/dL   Creatinine, Ser 9.10 0.61 - 1.24 mg/dL   Calcium  8.7 (L) 8.9 - 10.3 mg/dL   GFR, Estimated >39 >39 mL/min   Anion gap 12 5 - 15  Cooxemetry Panel (carboxy, met, total hgb, O2 sat)     Status: Abnormal   Collection Time: 08/23/24  5:15 AM  Result Value Ref Range   Total hemoglobin 13.3 12.0 - 16.0 g/dL   O2 Saturation 38.5 %   Carboxyhemoglobin 1.6 (H) 0.5 - 1.5 %   Methemoglobin <0.7 0.0 - 1.5 %  Magnesium     Status: None   Collection Time: 08/23/24  5:15 AM  Result Value Ref Range   Magnesium 2.2 1.7 - 2.4 mg/dL  Hepatic function panel     Status: None   Collection Time: 08/23/24 12:36 PM  Result Value Ref Range   Total Protein 7.2 6.5 - 8.1 g/dL   Albumin 4.0 3.5 - 5.0 g/dL   AST 32 15 - 41 U/L   ALT 36 0 - 44 U/L   Alkaline Phosphatase 102 38 - 126 U/L   Total Bilirubin 0.9 0.0 - 1.2 mg/dL   Bilirubin, Direct 0.1 0.0 - 0.2 mg/dL   Indirect Bilirubin 0.8 0.3 - 0.9 mg/dL    I have reviewed pertinent  nursing notes, vitals, labs, and images as necessary. I have ordered labwork to follow up on as indicated.  I have reviewed the last notes from staff over past 24 hours. I have discussed patient's care plan and test results with nursing staff, CM/SW, and other staff as appropriate.  Old records reviewed in assessment of this patient  Time spent: Greater than 50% of the 55 minute visit was spent in counseling/coordination of care for the patient as laid out in the A&P.   LOS: 6 days   Alm Apo, MD [facility] 08/23/2024, 4:07 PM

## 2024-08-24 ENCOUNTER — Encounter: Payer: Self-pay | Admitting: Oncology

## 2024-08-24 DIAGNOSIS — I5082 Biventricular heart failure: Secondary | ICD-10-CM | POA: Diagnosis not present

## 2024-08-24 DIAGNOSIS — I10 Essential (primary) hypertension: Secondary | ICD-10-CM

## 2024-08-24 DIAGNOSIS — I5041 Acute combined systolic (congestive) and diastolic (congestive) heart failure: Secondary | ICD-10-CM | POA: Diagnosis not present

## 2024-08-24 DIAGNOSIS — C3491 Malignant neoplasm of unspecified part of right bronchus or lung: Secondary | ICD-10-CM | POA: Diagnosis not present

## 2024-08-24 DIAGNOSIS — I2510 Atherosclerotic heart disease of native coronary artery without angina pectoris: Secondary | ICD-10-CM | POA: Diagnosis not present

## 2024-08-24 LAB — BASIC METABOLIC PANEL WITH GFR
Anion gap: 10 (ref 5–15)
BUN: 37 mg/dL — ABNORMAL HIGH (ref 8–23)
CO2: 27 mmol/L (ref 22–32)
Calcium: 8.4 mg/dL — ABNORMAL LOW (ref 8.9–10.3)
Chloride: 96 mmol/L — ABNORMAL LOW (ref 98–111)
Creatinine, Ser: 1.23 mg/dL (ref 0.61–1.24)
GFR, Estimated: >60 mL/min (ref >60)
Glucose, Bld: 83 mg/dL (ref 70–99)
Potassium: 4.5 mmol/L (ref 3.5–5.1)
Sodium: 133 mmol/L — ABNORMAL LOW (ref 135–145)

## 2024-08-24 LAB — COOXEMETRY PANEL
Carboxyhemoglobin: 1.5 % (ref 0.5–1.5)
Methemoglobin: <0.7 % (ref 0.0–1.5)
O2 Saturation: 60.8 %
Total hemoglobin: 12.8 g/dL (ref 12.0–16.0)

## 2024-08-24 LAB — MAGNESIUM: Magnesium: 2.2 mg/dL (ref 1.7–2.4)

## 2024-08-24 LAB — BRAIN NATRIURETIC PEPTIDE: B Natriuretic Peptide: 553.4 pg/mL — ABNORMAL HIGH (ref 0.0–100.0)

## 2024-08-24 LAB — LACTIC ACID, PLASMA: Lactic Acid, Venous: 1 mmol/L (ref 0.5–1.9)

## 2024-08-24 MED ORDER — FUROSEMIDE 40 MG PO TABS: 40.0000 mg | ORAL_TABLET | Freq: Every day | ORAL | Status: DC

## 2024-08-24 MED ORDER — IPRATROPIUM-ALBUTEROL 0.5-2.5 (3) MG/3ML IN SOLN
3.0000 mL | RESPIRATORY_TRACT | Status: DC | PRN
  Administered 2024-08-25: 3 mL via RESPIRATORY_TRACT
  Filled 2024-08-24: qty 3

## 2024-08-24 MED ORDER — REVEFENACIN 175 MCG/3ML IN SOLN
175.0000 ug | Freq: Every day | RESPIRATORY_TRACT | Status: DC
  Administered 2024-08-25 – 2024-08-26 (×2): 175 ug via RESPIRATORY_TRACT
  Filled 2024-08-24 (×2): qty 3

## 2024-08-24 NOTE — Plan of Care (Signed)
  Problem: Clinical Measurements: Goal: Will remain free from infection Outcome: Progressing Goal: Respiratory complications will improve Outcome: Progressing Goal: Cardiovascular complication will be avoided Outcome: Progressing   Problem: Elimination: Goal: Will not experience complications related to urinary retention Outcome: Progressing   Problem: Safety: Goal: Ability to remain free from injury will improve Outcome: Progressing   

## 2024-08-24 NOTE — Progress Notes (Signed)
 Cardiologist: Antonio Carrier, MD  Chief Complaint: Acute BiV HF  Subjective:    Endorses less shortness of breath  CVP 4 mmHg.   Choloxin 61%  UOP 24hrs 5.1L  Objective:   Weight Range: 63.4 kg Body mass index is 20.64 kg/m.   Vital Signs:   Temp:  [97.6 F (36.4 C)-98.2 F (36.8 C)] 97.6 F (36.4 C) (11/23 1110) Pulse Rate:  [37-109] 37 (11/23 1110) Resp:  [18-19] 18 (11/23 1110) BP: (92-107)/(64-78) 102/75 (11/23 1110) SpO2:  [6 %-99 %] 98 % (11/23 1110) Weight:  [63.4 kg] 63.4 kg (11/23 0500) Last BM Date : 08/23/24  Weight change: Filed Weights   08/22/24 0300 08/23/24 0300 08/24/24 0500  Weight: 67.4 kg 63.4 kg 63.4 kg    Intake/Output:   Intake/Output Summary (Last 24 hours) at 08/24/2024 1445 Last data filed at 08/24/2024 1436 Gross per 24 hour  Intake 883 ml  Output 5120 ml  Net -4237 ml   Net IO Since Admission: -21,681.33 mL [08/24/24 1445]   Physical Exam  General:  elderly appearing. hemodynamically stable Neck: JVD difficult to see.  Cor: Tachy/regular rate & rhythm. No murmurs. Lungs: inspiratory wheezes bilateral (improving), no rales,  Extremities: trace BLE edema. + UNNA boots. PICC RUE Neuro: alert & oriented x 3. Affect pleasant.   Telemetry   Sinus rhythm (Personally reviewed)    Labs    CBC Recent Labs    08/22/24 0246 08/23/24 0515  WBC 5.4 5.7  HGB 12.4* 12.7*  HCT 37.9* 38.2*  MCV 96.9 94.8  PLT 239 254   Basic Metabolic Panel Recent Labs    88/77/74 0515 08/24/24 0508  NA 133* 133*  K 3.4* 4.5  CL 90* 96*  CO2 31 27  GLUCOSE 95 83  BUN 24* 37*  CREATININE 0.89 1.23  CALCIUM  8.7* 8.4*  MG 2.2 2.2   Liver Function Tests Recent Labs    08/23/24 1236  AST 32  ALT 36  ALKPHOS 102  BILITOT 0.9  PROT 7.2  ALBUMIN 4.0   No results for input(s): LIPASE, AMYLASE in the last 72 hours. Cardiac Enzymes No results for input(s): CKTOTAL, CKMB, CKMBINDEX, TROPONINI in the last 72  hours.  BNP: BNP (last 3 results) Recent Labs    08/24/24 0508  BNP 553.4*    ProBNP (last 3 results) Recent Labs    07/16/24 1025 08/17/24 1737  PROBNP 6,828.0* 12,562.0*   D-Dimer No results for input(s): DDIMER in the last 72 hours. Hemoglobin A1C No results for input(s): HGBA1C in the last 72 hours.  Fasting Lipid Panel No results for input(s): CHOL, HDL, LDLCALC, TRIG, CHOLHDL, LDLDIRECT in the last 72 hours.  Thyroid  Function Tests No results for input(s): TSH, T4TOTAL, T3FREE, THYROIDAB in the last 72 hours.  Invalid input(s): FREET3   Other results:  Imaging   No results found.   Medications:    Scheduled Medications:  arformoterol   15 mcg Nebulization BID   budesonide  (PULMICORT ) nebulizer solution  0.25 mg Nebulization BID   Chlorhexidine  Gluconate Cloth  6 each Topical Daily   digoxin   0.125 mg Oral Daily   enoxaparin  (LOVENOX ) injection  40 mg Subcutaneous Q24H   [START ON 08/25/2024] furosemide   40 mg Oral Daily   ipratropium-albuterol   3 mL Nebulization TID   levothyroxine   100 mcg Oral Q0600   losartan   25 mg Oral Daily   [START ON 08/25/2024] revefenacin   175 mcg Nebulization Daily   rosuvastatin   20 mg Oral Daily  sodium chloride  flush  10-40 mL Intracatheter Q12H   sodium chloride  flush  3 mL Intravenous Q12H   spironolactone   12.5 mg Oral Daily    Infusions:    PRN Medications: acetaminophen  **OR** acetaminophen , ipratropium-albuterol , ondansetron  **OR** ondansetron  (ZOFRAN ) IV, sodium chloride  flush, sodium chloride  flush  Patient Profile   Antonio Payne is a 68 y.o. 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 AHF team to see for Acute BiV HF with low output state.   Assessment/Plan   Acute BiV HF with low output,  suspected keytruda  toxicity - Echo 8/20: EF 58%, RV normal, no  - Echo 11/20: EF 45%-47%, RV normal, trace MR/TR - Echo 08/06/24 showed EF <20%, LV with GHK, RV severely reduced, LA mild-mod dilated, RA severely dilated, mild MR, mod-severe TR. - L/RHC 08/21/2024 with mild-mod non-obs CAD. Severely elevated L/R filling pressures. Mod-severely reduced fick CO/CI. RA 15, PA 63/32 (42), PCWP 30, PA 50%, Fick CO/CI 3.3/1.8. - Concern for Keytruda  induced toxicity/myocarditis/ HF - Has been off Keytruda  for ~12 weeks now. Stopped 10/25.  - NYHA IV on admission - CVP 4 mmHg, Co-ox 61%, BP stable, Cr stable as well.  Change IV Lasix  to p.o. -BNP 553 - Lactic acid 1.0 - Will hold off on inotrope support at this time - Co-ox 68%->61% - Continue digoxin  0.125 mg daily - GDMT as tolerated.  - Continue losartan  12.5 mg daily - Continue spiro 12.5 mg daily - A1c 5.4. Will check coverage for SGLT2i - cMRI pending  - difficultly with breath holds. Re-attempt on Monday  - CRP 0.9, ESR 11 - Continue UNNA boots  - Strict I&O, daily weights    Metastatic non-small cell lung adenocarcinoma - Mets to brain and lymph nodes - Follows with Dr. Davonna.  - Off Keytruda  now for ~12 weeks. - With acute systolic HF and severely reduced LVEF would hold off on restarting Keytruda .  - Will need to discuss with AHF team, Oncology once euvolemic and LVEF is re-assessed.    CAD - LHC today with mild-mod non-obs CAD - Denies CP - LPA 72.5 - LDL 91. - Continue lipid-lowering agents.   COPD - per primary team  Plan discussed with RN and pharmacy post rounding.   Length of Stay: 7  Antonio Ricketson [place], DO  08/24/2024, 2:45 PM

## 2024-08-24 NOTE — Plan of Care (Signed)
  Problem: Health Behavior/Discharge Planning: Goal: Ability to manage health-related needs will improve Outcome: Progressing   Problem: Clinical Measurements: Goal: Ability to maintain clinical measurements within normal limits will improve Outcome: Progressing Goal: Cardiovascular complication will be avoided Outcome: Progressing   Problem: Safety: Goal: Ability to remain free from injury will improve Outcome: Progressing   Problem: Cardiac: Goal: Ability to achieve and maintain adequate cardiopulmonary perfusion will improve Outcome: Progressing

## 2024-08-24 NOTE — Progress Notes (Signed)
 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 Cardiology was consulted as well on admission.  He was treated for CHF exacerbation and started on diuresis with IV Lasix . He was transferred to [facility] for right and left heart cath, performed on 08/21/2024.  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  He had elevated pressures in left, right heart and pulmonary artery.  Moderate to severely reduced cardiac output.  Advanced CHF team consulted afterwards.  Interval History:  No events overnight.  Diuresing well.   Assessment and Plan: * Acute combined systolic and diastolic CHF, NYHA class 2 (HCC) - presented with SOB/DOE.  Workup notable for cardiomegaly, LE edema, small pleural effusion - initial pro-BNP on admission 12,562 - s/p IV lasix  at [facility] - s/p R/L heart cath on 08/21/24 at [facility]. NICM noted. Severely elevated PA psi and R/L heart psi. Mod/sev reduced CO - EF < 20% on echo; Gr 1 DD. Dilated RA, LA. RV sys function severely reduced and enlarged RV - advanced CHF team consulted post cath and managing diuresis  - now has PICC in place for coox/CVP monitoring  - Follow-up cardiac MRI; tentative for Monday as unsuccessful at holding breath on Friday - further GDMT per cardiology as well   Adenocarcinoma of lung, stage 4, right (HCC) - Follows with oncology.  Has been treated with single agent pembrolizumab  and currently noted to be on treatment break - Per oncology, also noted to have responded well with treatment and prognosis does not limit CHF treatment - Keytruda  has been recommended to be held per cardiology and  contingent on further discussion with oncology on treatment regimen going forward  Hypothyroidism - TSH normal on admission, 2.630 - Continue Synthroid   HTN (hypertension) - Currently controlled.  Regimen likely being modified with GDMT as per cardiology  COPD (chronic obstructive pulmonary disease) (HCC) - Some wheezing on exam but no significant exacerbation.  No change in his cough or sputum - Continue DuoNebs scheduled and as needed for now -Continue budesonide  and Brovana  nebs - Adding Mucinex  as well   Antimicrobials:   DVT prophylaxis:  enoxaparin  (LOVENOX ) injection 40 mg Start: 08/21/24 2200 Place TED hose Start: 08/18/24 1218 SCDs Start: 08/17/24 2219   Code Status:   Code Status: Full Code  Mobility Assessment (Last 72 Hours)     Mobility Assessment     Row Name 08/24/24 0900 08/23/24 2015 08/23/24 0800 08/22/24 2100 08/22/24 0720   Does the patient have exclusion criteria? No - Perform mobility assessment No - Perform mobility assessment No - Perform mobility assessment No - Perform mobility assessment No - Perform mobility assessment   What is the highest level of mobility based on the mobility assessment? Level 5 (Ambulates independently) - Balance while walking independently - Complete Level 5 (Ambulates independently) - Balance while walking independently - Complete Level 5 (Ambulates independently) - Balance while walking independently - Complete Level 5 (Ambulates independently) - Balance while walking independently - Complete Level 5 (Ambulates independently) - Balance while walking independently - Complete    Row Name 08/21/24 2100  Does the patient have exclusion criteria? No - Perform mobility assessment       What is the highest level of mobility based on the mobility assessment? Level 5 (Ambulates independently) - Balance while walking independently - Complete          Diet: Diet Orders (From admission, onward)     Start     Ordered    08/21/24 1107  Diet Heart Room service appropriate? Yes; Fluid consistency: Thin; Fluid restriction: 1500 mL Fluid  Diet effective now       Question Answer Comment  Room service appropriate? Yes   Fluid consistency: Thin   Fluid restriction: 1500 mL Fluid      08/21/24 1106            Barriers to discharge: none Disposition Plan:  Home HH orders placed: TBD Status is: Inpt  Objective: Blood pressure 102/75, pulse (!) 37, temperature 97.6 F (36.4 C), temperature source Oral, resp. rate 18, height 5' 9 (1.753 m), weight 63.4 kg, SpO2 98%.  Examination:  Physical Exam Constitutional:      General: He is not in acute distress.    Appearance: Normal appearance.  HENT:     Head: Normocephalic and atraumatic.     Mouth/Throat:     Mouth: Mucous membranes are moist.  Eyes:     Extraocular Movements: Extraocular movements intact.  Cardiovascular:     Rate and Rhythm: Normal rate and regular rhythm.  Pulmonary:     Effort: Pulmonary effort is normal. No respiratory distress.     Breath sounds: Wheezing present.  Abdominal:     General: Bowel sounds are normal. There is no distension.     Palpations: Abdomen is soft.     Tenderness: There is no abdominal tenderness.  Musculoskeletal:        General: Normal range of motion.     Cervical back: Normal range of motion and neck supple.     Right lower leg: Edema (2+ initially (improving, now 1-2+)) present.     Left lower leg: Edema (2+ initially (improving, now 1-2+)) present.     Comments: Unna boots in place bilaterally  Skin:    General: Skin is warm and dry.  Neurological:     General: No focal deficit present.     Mental Status: He is alert.  Psychiatric:        Mood and Affect: Mood normal.        Behavior: Behavior normal.      Consultants:  Cardiology/advanced CHF  Procedures:  R/L heart cath: 08/21/24  Data Reviewed: Results for orders placed or performed during the hospital encounter of 08/17/24 (from  the past 24 hours)  Cooxemetry Panel (carboxy, met, total hgb, O2 sat)     Status: Abnormal   Collection Time: 08/23/24  6:25 PM  Result Value Ref Range   Total hemoglobin 13.8 12.0 - 16.0 g/dL   O2 Saturation 34.5 %   Carboxyhemoglobin 2.1 (H) 0.5 - 1.5 %   Methemoglobin <0.7 0.0 - 1.5 %  Basic metabolic panel with GFR     Status: Abnormal   Collection Time: 08/24/24  5:08 AM  Result Value Ref Range   Sodium 133 (L) 135 - 145 mmol/L   Potassium 4.5 3.5 - 5.1 mmol/L   Chloride 96 (L) 98 - 111 mmol/L   CO2 27 22 - 32 mmol/L   Glucose, Bld 83 70 - 99 mg/dL   BUN 37 (H) 8 - 23 mg/dL  Creatinine, Ser 1.23 0.61 - 1.24 mg/dL   Calcium  8.4 (L) 8.9 - 10.3 mg/dL   GFR, Estimated >39 >39 mL/min   Anion gap 10 5 - 15  Magnesium     Status: None   Collection Time: 08/24/24  5:08 AM  Result Value Ref Range   Magnesium 2.2 1.7 - 2.4 mg/dL  Lactic acid, plasma     Status: None   Collection Time: 08/24/24  5:08 AM  Result Value Ref Range   Lactic Acid, Venous 1.0 0.5 - 1.9 mmol/L  Brain natriuretic peptide     Status: Abnormal   Collection Time: 08/24/24  5:08 AM  Result Value Ref Range   B Natriuretic Peptide 553.4 (H) 0.0 - 100.0 pg/mL  Cooxemetry Panel (carboxy, met, total hgb, O2 sat)     Status: None   Collection Time: 08/24/24  5:10 AM  Result Value Ref Range   Total hemoglobin 12.8 12.0 - 16.0 g/dL   O2 Saturation 39.1 %   Carboxyhemoglobin 1.5 0.5 - 1.5 %   Methemoglobin <0.7 0.0 - 1.5 %    I have reviewed pertinent nursing notes, vitals, labs, and images as necessary. I have ordered labwork to follow up on as indicated.  I have reviewed the last notes from staff over past 24 hours. I have discussed patient's care plan and test results with nursing staff, CM/SW, and other staff as appropriate.  Old records reviewed in assessment of this patient  Time spent: Greater than 50% of the 55 minute visit was spent in counseling/coordination of care for the patient as laid out in the  A&P.   LOS: 7 days   Alm Apo, MD [facility] 08/24/2024, 1:02 PM

## 2024-08-25 ENCOUNTER — Inpatient Hospital Stay (HOSPITAL_COMMUNITY)

## 2024-08-25 ENCOUNTER — Telehealth (HOSPITAL_COMMUNITY): Payer: Self-pay

## 2024-08-25 DIAGNOSIS — I5041 Acute combined systolic (congestive) and diastolic (congestive) heart failure: Secondary | ICD-10-CM | POA: Diagnosis not present

## 2024-08-25 DIAGNOSIS — I5082 Biventricular heart failure: Secondary | ICD-10-CM

## 2024-08-25 LAB — COOXEMETRY PANEL
Carboxyhemoglobin: 2 % — ABNORMAL HIGH (ref 0.5–1.5)
Methemoglobin: <0.7 % (ref 0.0–1.5)
O2 Saturation: 71.7 %
Total hemoglobin: 13.1 g/dL (ref 12.0–16.0)

## 2024-08-25 LAB — BASIC METABOLIC PANEL WITH GFR
Anion gap: 9 (ref 5–15)
BUN: 32 mg/dL — ABNORMAL HIGH (ref 8–23)
CO2: 28 mmol/L (ref 22–32)
Calcium: 8.8 mg/dL — ABNORMAL LOW (ref 8.9–10.3)
Chloride: 93 mmol/L — ABNORMAL LOW (ref 98–111)
Creatinine, Ser: 0.89 mg/dL (ref 0.61–1.24)
GFR, Estimated: >60 mL/min (ref >60)
Glucose, Bld: 73 mg/dL (ref 70–99)
Potassium: 4.3 mmol/L (ref 3.5–5.1)
Sodium: 130 mmol/L — ABNORMAL LOW (ref 135–145)

## 2024-08-25 LAB — MAGNESIUM: Magnesium: 2.2 mg/dL (ref 1.7–2.4)

## 2024-08-25 MED ORDER — LORAZEPAM 2 MG/ML IJ SOLN
2.0000 mg | Freq: Once | INTRAMUSCULAR | Status: AC | PRN
  Administered 2024-08-25: 2 mg via INTRAVENOUS
  Filled 2024-08-25: qty 1

## 2024-08-25 MED ORDER — DAPAGLIFLOZIN PROPANEDIOL 10 MG PO TABS
10.0000 mg | ORAL_TABLET | Freq: Every day | ORAL | Status: DC
  Administered 2024-08-25 – 2024-08-26 (×2): 10 mg via ORAL
  Filled 2024-08-25 (×2): qty 1

## 2024-08-25 MED ORDER — SACUBITRIL-VALSARTAN 24-26 MG PO TABS
1.0000 | ORAL_TABLET | Freq: Two times a day (BID) | ORAL | Status: DC
  Administered 2024-08-25 – 2024-08-26 (×3): 1 via ORAL
  Filled 2024-08-25 (×3): qty 1

## 2024-08-25 MED ORDER — GADOBUTROL 1 MMOL/ML IV SOLN
10.0000 mL | Freq: Once | INTRAVENOUS | Status: AC | PRN
  Administered 2024-08-25: 10 mL via INTRAVENOUS

## 2024-08-25 NOTE — Progress Notes (Signed)
 Orthopedic Tech Progress Note Patient Details:  Antonio Payne 1956/08/04 [id number]  Ortho Devices Type of Ortho Device: Ace wrap, Unna boot Ortho Device/Splint Location: BLE Ortho Device/Splint Interventions: Removal, Ordered, Application   Post Interventions Patient Tolerated: Well Instructions Provided: Care of device  Antonio Payne Pac 08/25/2024, 12:00 PM

## 2024-08-25 NOTE — Progress Notes (Signed)
 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 Cardiology was consulted as well on admission.  He was treated for CHF exacerbation and started on diuresis with IV Lasix . He was transferred to [facility] for right and left heart cath, performed on 08/21/2024.  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  He had elevated pressures in left, right heart and pulmonary artery.  Moderate to severely reduced cardiac output.  Advanced CHF team consulted afterwards.  Interval History:  No events overnight.  Diuresing well. Continues to voice that he wants to go home but reexplained to him what is being done in the hospital. Tentative plan for repeat attempt of MRI today.   Assessment and Plan: * Acute combined systolic and diastolic CHF, NYHA class 2 (HCC) - presented with SOB/DOE.  Workup notable for cardiomegaly, LE edema, small pleural effusion - initial pro-BNP on admission 12,562 - s/p IV lasix  at [facility] - s/p R/L heart cath on 08/21/24 at [facility]. NICM noted. Severely elevated PA psi and R/L heart psi. Mod/sev reduced CO - EF < 20% on echo; Gr 1 DD. Dilated RA, LA. RV sys function severely reduced and enlarged RV - advanced CHF team consulted post cath and managing diuresis  - now has PICC in place for coox/CVP monitoring  - Follow-up cardiac MRI; tentative for Monday as unsuccessful at holding breath on Friday - further GDMT per cardiology as well   Adenocarcinoma of lung, stage 4, right (HCC) - Follows with oncology.  Has been treated with single agent pembrolizumab  and currently noted to be on treatment break - Per oncology,  also noted to have responded well with treatment and prognosis does not limit CHF treatment - Keytruda  has been recommended to be held per cardiology and contingent on further discussion with oncology on treatment regimen going forward  Hypothyroidism - TSH normal on admission, 2.630 - Continue Synthroid   HTN (hypertension) - Currently controlled.  Regimen likely being modified with GDMT as per cardiology  COPD (chronic obstructive pulmonary disease) (HCC) - Some wheezing on exam but no significant exacerbation.  No change in his cough or sputum - Continue DuoNebs scheduled and as needed for now -Continue budesonide  and Brovana  nebs - Adding Mucinex  as well   Antimicrobials:   DVT prophylaxis:  Place TED hose Start: 08/18/24 1218 SCDs Start: 08/17/24 2219   Code Status:   Code Status: Full Code  Mobility Assessment (Last 72 Hours)     Mobility Assessment     Row Name 08/25/24 0845 08/24/24 2000 08/24/24 1700 08/24/24 0900 08/23/24 2015   Does the patient have exclusion criteria? No- Perform mobility assessment No - Perform mobility assessment No - Perform mobility assessment No - Perform mobility assessment No - Perform mobility assessment   What is the highest level of mobility based on the mobility assessment? Level 5 (Ambulates independently) - Balance while walking independently - Complete Level 5 (Ambulates independently) - Balance while walking independently - Complete Level 5 (Ambulates independently) - Balance while walking independently - Complete Level 5 (Ambulates independently) - Balance while walking independently - Complete Level 5 (Ambulates independently) -  Balance while walking independently - Complete    Row Name 08/23/24 0800 08/22/24 2100         Does the patient have exclusion criteria? No - Perform mobility assessment No - Perform mobility assessment      What is the highest level of mobility based on the mobility assessment? Level 5 (Ambulates  independently) - Balance while walking independently - Complete Level 5 (Ambulates independently) - Balance while walking independently - Complete         Diet: Diet Orders (From admission, onward)     Start     Ordered   08/21/24 1107  Diet Heart Room service appropriate? Yes; Fluid consistency: Thin; Fluid restriction: 1500 mL Fluid  Diet effective now       Question Answer Comment  Room service appropriate? Yes   Fluid consistency: Thin   Fluid restriction: 1500 mL Fluid      08/21/24 1106            Barriers to discharge: none Disposition Plan:  Home HH orders placed: TBD Status is: Inpt  Objective: Blood pressure 109/80, pulse (!) 102, temperature 97.8 F (36.6 C), temperature source Oral, resp. rate 20, height 5' 9 (1.753 m), weight 63.7 kg, SpO2 99%.  Examination:  Physical Exam Constitutional:      General: He is not in acute distress.    Appearance: Normal appearance.  HENT:     Head: Normocephalic and atraumatic.     Mouth/Throat:     Mouth: Mucous membranes are moist.  Eyes:     Extraocular Movements: Extraocular movements intact.  Cardiovascular:     Rate and Rhythm: Normal rate and regular rhythm.  Pulmonary:     Effort: Pulmonary effort is normal. No respiratory distress.     Breath sounds: Wheezing present.  Abdominal:     General: Bowel sounds are normal. There is no distension.     Palpations: Abdomen is soft.     Tenderness: There is no abdominal tenderness.  Musculoskeletal:        General: Normal range of motion.     Cervical back: Normal range of motion and neck supple.     Right lower leg: Edema (2+ initially (improving, now 1-2+)) present.     Left lower leg: Edema (2+ initially (improving, now 1-2+)) present.     Comments: Unna boots in place bilaterally  Skin:    General: Skin is warm and dry.  Neurological:     General: No focal deficit present.     Mental Status: He is alert.  Psychiatric:        Mood and Affect: Mood normal.         Behavior: Behavior normal.      Consultants:  Cardiology/advanced CHF  Procedures:  R/L heart cath: 08/21/24  Data Reviewed: Results for orders placed or performed during the hospital encounter of 08/17/24 (from the past 24 hours)  Basic metabolic panel with GFR     Status: Abnormal   Collection Time: 08/25/24  4:43 AM  Result Value Ref Range   Sodium 130 (L) 135 - 145 mmol/L   Potassium 4.3 3.5 - 5.1 mmol/L   Chloride 93 (L) 98 - 111 mmol/L   CO2 28 22 - 32 mmol/L   Glucose, Bld 73 70 - 99 mg/dL   BUN 32 (H) 8 - 23 mg/dL   Creatinine, Ser 9.10 0.61 - 1.24 mg/dL   Calcium  8.8 (L) 8.9 - 10.3 mg/dL   GFR, Estimated >39 >39  mL/min   Anion gap 9 5 - 15  Magnesium     Status: None   Collection Time: 08/25/24  4:43 AM  Result Value Ref Range   Magnesium 2.2 1.7 - 2.4 mg/dL  Cooxemetry Panel (carboxy, met, total hgb, O2 sat)     Status: Abnormal   Collection Time: 08/25/24  4:44 AM  Result Value Ref Range   Total hemoglobin 13.1 12.0 - 16.0 g/dL   O2 Saturation 28.2 %   Carboxyhemoglobin 2.0 (H) 0.5 - 1.5 %   Methemoglobin <0.7 0.0 - 1.5 %    I have reviewed pertinent nursing notes, vitals, labs, and images as necessary. I have ordered labwork to follow up on as indicated.  I have reviewed the last notes from staff over past 24 hours. I have discussed patient's care plan and test results with nursing staff, CM/SW, and other staff as appropriate.  Old records reviewed in assessment of this patient  Time spent: Greater than 50% of the 55 minute visit was spent in counseling/coordination of care for the patient as laid out in the A&P.   LOS: 8 days   Alm Apo, MD [facility] 08/25/2024, 11:46 AM

## 2024-08-25 NOTE — TOC Initial Note (Signed)
 Transition of Care [facility]) - Initial/Assessment Note    Patient Details  Name: Antonio Payne MRN: [id number] Date of Birth: 1956/03/10  Transition of Care [facility]) CM/SW Contact:    [name] Phone Number: [phone] 08/25/2024, 12:20 PM  Clinical Narrative:     HF CSW met with patient at bedside. Patient stated that he lives with his son and DIL. Patient stated that he has no history of HH services. Patient stated that he does not use any equipment. Patient stated that he has a scale at home. Patient stated that he does not have a PCP. CSW explained that hospital follow up appointments are typically scheduled close to dc. Patient agrees, and requested to contact his DIL before scheduling appointment. Patient stated that family will provide transportation at dc.   12:26- HF CSW called patients DIL and stated that she would like care established at [facility].   HF CSW called and scheduled patients hospital follow-up appointment for November 25, 2024 at 1:20 PM.   HF team signed off. Unit CSW/CM will continue to follow and monitor for dc readiness.               Expected Discharge Plan: Home/Self Care Barriers to Discharge: Continued Medical Work up   Patient Goals and CMS Choice Patient states their goals for this hospitalization and ongoing recovery are:: Return home CMS Medicare.gov Compare Post Acute Care list provided to:: Patient Choice offered to / list presented to : Patient [place] ownership interest in [employer].provided to:: Patient    Expected Discharge Plan and Services       Living arrangements for the past 2 months: Single Family Home                               Date [facility] Agency Contacted: 08/19/24 Time HH Agency Contacted: [id number] Representative spoke with at [facility] Agency: Darleene  Prior Living Arrangements/Services Living arrangements for the past 2 months: Single Family Home Lives with:: Self Patient language and need  for interpreter reviewed:: Yes Do you feel safe going back to the place where you live?: Yes      Need for Family Participation in Patient Care: Yes (Comment) Care giver support system in place?: Yes (comment) Current home services: DME Criminal Activity/Legal Involvement Pertinent to Current Situation/Hospitalization: No - Comment as needed  Activities of Daily Living   ADL Screening (condition at time of admission) Independently performs ADLs?: Yes (appropriate for developmental age) Is the patient deaf or have difficulty hearing?: No Does the patient have difficulty seeing, even when wearing glasses/contacts?: No Does the patient have difficulty concentrating, remembering, or making decisions?: No  Permission Sought/Granted Permission sought to share information with : Case Manager          Permission granted to share info w Relationship: Daughter     Emotional Assessment     Affect (typically observed): Accepting Orientation: : Oriented to Self, Oriented to Place, Oriented to Situation, Oriented to  Time Alcohol / Substance Use: Not Applicable Psych Involvement: No (comment)  Admission diagnosis:  Acute combined systolic and diastolic CHF, NYHA class 2 (HCC) [I50.41] Patient Active Problem List   Diagnosis Date Noted   Biventricular failure (HCC) 08/23/2024   Atherosclerosis of native coronary artery of native heart without angina pectoris 08/23/2024   Acute HFrEF (heart failure with reduced ejection fraction) (HCC) 08/23/2024   COPD (chronic obstructive pulmonary disease) (HCC)  08/21/2024   HTN (hypertension) 08/21/2024   Hypothyroidism 08/21/2024   Acute combined systolic and diastolic CHF, NYHA class 2 (HCC) 08/17/2024   Adenocarcinoma of lung, stage 4, right (HCC) 12/08/2020   PCP:  Pcp, No Pharmacy:   [facility] [phone] - [place], [place] - [address] AT [facility] [address] [place] [address] Phone: [phone]  Fax: [phone]     Social Drivers of Health (SDOH) Social History: SDOH Screenings   Food Insecurity: No Food Insecurity (08/17/2024)  Housing: Low Risk  (08/17/2024)  Transportation Needs: No Transportation Needs (08/17/2024)  Utilities: Not At Risk (08/17/2024)  Alcohol Screen: Low Risk  (12/06/2020)  Depression (PHQ2-9): Low Risk  (08/12/2024)  Financial Resource Strain: Low Risk  (12/06/2020)  Physical Activity: Sufficiently Active (12/06/2020)  Social Connections: Moderately Isolated (08/17/2024)  Stress: No Stress Concern Present (12/06/2020)  Tobacco Use: Medium Risk (08/18/2024)   SDOH Interventions:     Readmission Risk Interventions     No data to display

## 2024-08-25 NOTE — Plan of Care (Signed)
   Problem: Health Behavior/Discharge Planning: Goal: Ability to manage health-related needs will improve Outcome: Progressing   Problem: Clinical Measurements: Goal: Will remain free from infection Outcome: Progressing Goal: Diagnostic test results will improve Outcome: Progressing

## 2024-08-25 NOTE — Telephone Encounter (Signed)
 Called to confirm/remind patient of their appointment at [facility] on 08/26/24 9:30.   Appointment:   [] Confirmed  [x] Left mess   [] No answer/No voice mail  [] VM Full/unable to leave message  [] Phone not in service  Patient reminded to bring all medications and/or complete list.  Confirmed patient has transportation. Gave directions, instructed to utilize valet parking.

## 2024-08-25 NOTE — Progress Notes (Signed)
 Patient ID: Antonio Payne, male   DOB: Apr 18, 1956, 68 y.o.   MRN: [id number]     Cardiologist: Alvan Carrier, MD  Chief Complaint: Acute BiV HF  Subjective:    Co-ox 72%, CVP < 5.   Denies dyspnea, wants to go home.   Objective:   Weight Range: 63.7 kg Body mass index is 20.74 kg/m.   Vital Signs:   Temp:  [97.3 F (36.3 C)-97.9 F (36.6 C)] 97.6 F (36.4 C) (11/24 0729) Pulse Rate:  [37-108] 93 (11/24 0729) Resp:  [18-20] 20 (11/24 0729) BP: (101-115)/(75-83) 115/83 (11/24 0729) SpO2:  [6 %-100 %] 98 % (11/24 0729) Weight:  [63.7 kg] 63.7 kg (11/24 0435) Last BM Date : 08/23/24  Weight change: Filed Weights   08/23/24 0300 08/24/24 0500 08/25/24 0435  Weight: 63.4 kg 63.4 kg 63.7 kg    Intake/Output:   Intake/Output Summary (Last 24 hours) at 08/25/2024 0735 Last data filed at 08/25/2024 0600 Gross per 24 hour  Intake 903 ml  Output 3200 ml  Net -2297 ml   Net IO Since Admission: -21,881.33 mL [08/25/24 0735]   Physical Exam  General: NAD Neck: No JVD, no thyromegaly or thyroid  nodule.  Lungs: Rhonchi bilaterally CV: Nondisplaced PMI.  Heart regular S1/S2, no S3/S4, no murmur.  No peripheral edema.  Abdomen: Soft, nontender, no hepatosplenomegaly, no distention.  Skin: Intact without lesions or rashes.  Neurologic: Alert and oriented x 3.  Psych: Normal affect. Extremities: No clubbing or cyanosis.  HEENT: Normal.   Telemetry   Sinus rhythm (Personally reviewed)    Labs    CBC Recent Labs    08/23/24 0515  WBC 5.7  HGB 12.7*  HCT 38.2*  MCV 94.8  PLT 254   Basic Metabolic Panel Recent Labs    88/76/74 0508 08/25/24 0443  NA 133* 130*  K 4.5 4.3  CL 96* 93*  CO2 27 28  GLUCOSE 83 73  BUN 37* 32*  CREATININE 1.23 0.89  CALCIUM  8.4* 8.8*  MG 2.2 2.2   Liver Function Tests Recent Labs    08/23/24 1236  AST 32  ALT 36  ALKPHOS 102  BILITOT 0.9  PROT 7.2  ALBUMIN 4.0   No results for input(s): LIPASE, AMYLASE in the  last 72 hours. Cardiac Enzymes No results for input(s): CKTOTAL, CKMB, CKMBINDEX, TROPONINI in the last 72 hours.  BNP: BNP (last 3 results) Recent Labs    08/24/24 0508  BNP 553.4*    ProBNP (last 3 results) Recent Labs    07/16/24 1025 08/17/24 1737  PROBNP 6,828.0* 12,562.0*   D-Dimer No results for input(s): DDIMER in the last 72 hours. Hemoglobin A1C No results for input(s): HGBA1C in the last 72 hours.  Fasting Lipid Panel No results for input(s): CHOL, HDL, LDLCALC, TRIG, CHOLHDL, LDLDIRECT in the last 72 hours.  Thyroid  Function Tests No results for input(s): TSH, T4TOTAL, T3FREE, THYROIDAB in the last 72 hours.  Invalid input(s): FREET3   Other results:  Imaging   No results found.   Medications:    Scheduled Medications:  arformoterol   15 mcg Nebulization BID   budesonide  (PULMICORT ) nebulizer solution  0.25 mg Nebulization BID   Chlorhexidine  Gluconate Cloth  6 each Topical Daily   dapagliflozin  propanediol  10 mg Oral Daily   digoxin   0.125 mg Oral Daily   enoxaparin  (LOVENOX ) injection  40 mg Subcutaneous Q24H   ipratropium-albuterol   3 mL Nebulization TID   levothyroxine   100 mcg Oral Q0600  revefenacin   175 mcg Nebulization Daily   rosuvastatin   20 mg Oral Daily   sacubitril -valsartan   1 tablet Oral BID   sodium chloride  flush  10-40 mL Intracatheter Q12H   sodium chloride  flush  3 mL Intravenous Q12H   spironolactone   12.5 mg Oral Daily    Infusions:    PRN Medications: acetaminophen  **OR** acetaminophen , ipratropium-albuterol , ondansetron  **OR** ondansetron  (ZOFRAN ) IV, sodium chloride  flush, sodium chloride  flush  Patient Profile   Antonio Payne is a 68 y.o. 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 AHF team to  see for Acute BiV HF with low output state.   Assessment/Plan   Acute BiV HF with low output, suspected keytruda  toxicity - Echo 8/20: EF 58%, RV normal, no  - Echo 11/20: EF 45%-47%, RV normal, trace MR/TR - Echo 08/06/24 showed EF <20%, LV with GHK, RV severely reduced, LA mild-mod dilated, RA severely dilated, mild MR, mod-severe TR. - L/RHC 08/21/2024 with mild-mod non-obs CAD. Severely elevated L/R filling pressures. Mod-severely reduced fick CO/CI. RA 15, PA 63/32 (42), PCWP 30, PA 50%, Fick CO/CI 3.3/1.8. - Concern for Keytruda  induced toxicity/myocarditis/ HF - Has been off Keytruda  for ~12 weeks now. Stopped 10/25.  - NYHA IV on admission - CVP < 5, co-ox 72%.  - Stop Lasix , start Farxiga  10 mg daily.  - Continue digoxin  0.125 mg daily - GDMT as tolerated.  - Stop losartan , start Entresto  24/26 bid. Watch creatinine closely.  - Continue spiro 12.5 mg daily - Will try cardiac MRI again was too short of breath prior.  - CRP 0.9, ESR 11 - Continue UNNA boots  - Strict I&O, daily weights    Metastatic non-small cell lung adenocarcinoma - Mets to brain and lymph nodes - Follows with Dr. Davonna.  - Off Keytruda  now for ~12 weeks. - With acute systolic HF and severely reduced LVEF would hold off on restarting Keytruda .  - Will need to discuss with AHF team, Oncology once euvolemic and LVEF is re-assessed.    CAD - LHC with mild-mod non-obs CAD - Denies CP - LPA 72.5 - LDL 91. - Continue lipid-lowering agents.   COPD - per primary team - Significant emphysema.   Mobilize, aim for home tomorrow.    Length of Stay: 8  Ezra Shuck, MD  08/25/2024, 7:35 AM

## 2024-08-26 ENCOUNTER — Other Ambulatory Visit (HOSPITAL_COMMUNITY): Payer: Self-pay

## 2024-08-26 ENCOUNTER — Inpatient Hospital Stay

## 2024-08-26 ENCOUNTER — Ambulatory Visit (HOSPITAL_COMMUNITY)

## 2024-08-26 ENCOUNTER — Encounter: Payer: Self-pay | Admitting: Oncology

## 2024-08-26 DIAGNOSIS — I5041 Acute combined systolic (congestive) and diastolic (congestive) heart failure: Secondary | ICD-10-CM | POA: Diagnosis not present

## 2024-08-26 LAB — COOXEMETRY PANEL
Carboxyhemoglobin: 1.8 % — ABNORMAL HIGH (ref 0.5–1.5)
Methemoglobin: <0.7 % (ref 0.0–1.5)
O2 Saturation: 62 %
Total hemoglobin: 14 g/dL (ref 12.0–16.0)

## 2024-08-26 LAB — BASIC METABOLIC PANEL WITH GFR
Anion gap: 9 (ref 5–15)
BUN: 26 mg/dL — ABNORMAL HIGH (ref 8–23)
CO2: 25 mmol/L (ref 22–32)
Calcium: 8.6 mg/dL — ABNORMAL LOW (ref 8.9–10.3)
Chloride: 95 mmol/L — ABNORMAL LOW (ref 98–111)
Creatinine, Ser: 0.67 mg/dL (ref 0.61–1.24)
GFR, Estimated: >60 mL/min (ref >60)
Glucose, Bld: 84 mg/dL (ref 70–99)
Potassium: 4.5 mmol/L (ref 3.5–5.1)
Sodium: 129 mmol/L — ABNORMAL LOW (ref 135–145)

## 2024-08-26 LAB — MAGNESIUM: Magnesium: 2.2 mg/dL (ref 1.7–2.4)

## 2024-08-26 MED ORDER — SPIRONOLACTONE 25 MG PO TABS
25.0000 mg | ORAL_TABLET | Freq: Every day | ORAL | 3 refills | Status: DC
  Filled 2024-08-26: qty 90, 90d supply, fill #0

## 2024-08-26 MED ORDER — ROSUVASTATIN CALCIUM 20 MG PO TABS
20.0000 mg | ORAL_TABLET | Freq: Every day | ORAL | 3 refills | Status: DC
  Filled 2024-08-26: qty 90, 90d supply, fill #0

## 2024-08-26 MED ORDER — DIGOXIN 125 MCG PO TABS
0.1250 mg | ORAL_TABLET | Freq: Every day | ORAL | 11 refills | Status: DC
  Filled 2024-08-26: qty 30, 30d supply, fill #0

## 2024-08-26 MED ORDER — SACUBITRIL-VALSARTAN 24-26 MG PO TABS
1.0000 | ORAL_TABLET | Freq: Two times a day (BID) | ORAL | 11 refills | Status: DC
  Filled 2024-08-26: qty 60, 30d supply, fill #0

## 2024-08-26 MED ORDER — EMPAGLIFLOZIN 10 MG PO TABS
10.0000 mg | ORAL_TABLET | Freq: Every day | ORAL | 11 refills | Status: AC
Start: 2024-08-26 — End: ?
  Filled 2024-08-26: qty 30, 30d supply, fill #0

## 2024-08-26 MED ORDER — ALBUTEROL SULFATE HFA 108 (90 BASE) MCG/ACT IN AERS
2.0000 | INHALATION_SPRAY | RESPIRATORY_TRACT | 3 refills | Status: DC | PRN
  Filled 2024-08-26: qty 6.7, 30d supply, fill #0

## 2024-08-26 MED ORDER — SPIRONOLACTONE 25 MG PO TABS
25.0000 mg | ORAL_TABLET | Freq: Every day | ORAL | Status: DC
  Administered 2024-08-26: 25 mg via ORAL
  Filled 2024-08-26: qty 1

## 2024-08-26 MED ORDER — DAPAGLIFLOZIN PROPANEDIOL 10 MG PO TABS
10.0000 mg | ORAL_TABLET | Freq: Every day | ORAL | 11 refills | Status: DC
  Filled 2024-08-26: qty 30, 30d supply, fill #0

## 2024-08-26 MED ORDER — POTASSIUM CHLORIDE CRYS ER 20 MEQ PO TBCR
40.0000 meq | EXTENDED_RELEASE_TABLET | Freq: Every day | ORAL | 3 refills | Status: DC | PRN
  Filled 2024-08-26: qty 30, 15d supply, fill #0

## 2024-08-26 MED ORDER — FUROSEMIDE 40 MG PO TABS
40.0000 mg | ORAL_TABLET | Freq: Every day | ORAL | 0 refills | Status: AC | PRN
  Filled 2024-08-26: qty 90, 90d supply, fill #0

## 2024-08-26 NOTE — Plan of Care (Signed)
  Problem: Clinical Measurements: Goal: Will remain free from infection Outcome: Progressing Goal: Respiratory complications will improve Outcome: Progressing Goal: Cardiovascular complication will be avoided Outcome: Progressing   Problem: Elimination: Goal: Will not experience complications related to bowel motility Outcome: Progressing Goal: Will not experience complications related to urinary retention Outcome: Progressing   Problem: Safety: Goal: Ability to remain free from injury will improve Outcome: Progressing   Problem: Respiratory: Goal: Ability to maintain a clear airway will improve Outcome: Progressing Goal: Levels of oxygenation will improve Outcome: Progressing

## 2024-08-26 NOTE — Progress Notes (Addendum)
 Advanced Heart Failure Rounding Note  Cardiologist: Antonio Carrier, MD   Chief Complaint: HFrEF  Subjective:    Co-ox 62%. Not on inotrope support.  CVP < 5.  No shortness of breath. Really wants to go home.    Objective:   Weight Range: 64.8 kg Body mass index is 21.1 kg/m.   Vital Signs:   Temp:  [97.6 F (36.4 C)-97.8 F (36.6 C)] 97.8 F (36.6 C) (11/25 0400) Pulse Rate:  [93-105] 97 (11/25 0400) Resp:  [16-20] 18 (11/25 0400) BP: (92-109)/(64-80) 107/68 (11/25 0400) SpO2:  [96 %-99 %] 99 % (11/25 0400) Weight:  [64.8 kg] 64.8 kg (11/25 0500) Last BM Date : 08/23/24  Weight change: Filed Weights   08/24/24 0500 08/25/24 0435 08/26/24 0500  Weight: 63.4 kg 63.7 kg 64.8 kg    Intake/Output:   Intake/Output Summary (Last 24 hours) at 08/26/2024 0731 Last data filed at 08/26/2024 0406 Gross per 24 hour  Intake 700 ml  Output 1915 ml  Net -1215 ml      Physical Exam    General:  Sitting up in bed. No distress.  Cor: Regular rate & rhythm. No murmurs. Lungs: Diminished Abdomen: Soft, nontender, nondistended. Extremities: No edema Neuro: Alert & orientedx3. Affect pleasant   Telemetry   SR 60s  Labs    CBC No results for input(s): WBC, NEUTROABS, HGB, HCT, MCV, PLT in the last 72 hours. Basic Metabolic Panel Recent Labs    88/75/74 0443 08/26/24 0500  NA 130* 129*  K 4.3 4.5  CL 93* 95*  CO2 28 25  GLUCOSE 73 84  BUN 32* 26*  CREATININE 0.89 0.67  CALCIUM  8.8* 8.6*  MG 2.2 2.2   Liver Function Tests Recent Labs    08/23/24 1236  AST 32  ALT 36  ALKPHOS 102  BILITOT 0.9  PROT 7.2  ALBUMIN 4.0   No results for input(s): LIPASE, AMYLASE in the last 72 hours. Cardiac Enzymes No results for input(s): CKTOTAL, CKMB, CKMBINDEX, TROPONINI in the last 72 hours.  BNP: BNP (last 3 results) Recent Labs    08/24/24 0508  BNP 553.4*    ProBNP (last 3 results) Recent Labs    07/16/24 1025  08/17/24 1737  PROBNP 6,828.0* 12,562.0*     D-Dimer No results for input(s): DDIMER in the last 72 hours. Hemoglobin A1C No results for input(s): HGBA1C in the last 72 hours. Fasting Lipid Panel No results for input(s): CHOL, HDL, LDLCALC, TRIG, CHOLHDL, LDLDIRECT in the last 72 hours. Thyroid  Function Tests No results for input(s): TSH, T4TOTAL, T3FREE, THYROIDAB in the last 72 hours.  Invalid input(s): FREET3  Other results:   Imaging    MR CARDIAC MORPHOLOGY W WO CONTRAST Result Date: 08/25/2024 CLINICAL DATA:  Cardiomyopathy of uncertain etiology EXAM: CARDIAC MRI TECHNIQUE: The patient was scanned on a 1.5 Tesla GE magnet. A dedicated cardiac coil was used. Functional imaging was done using Fiesta sequences. 2,3, and 4 chamber views were done to assess for RWMA's. Modified Simpson's rule using a short axis stack was used to calculate an ejection fraction on a dedicated work [employer]. The patient received 10 cc of Gadavist . After 10 minutes inversion recovery sequences were used to assess for infiltration and scar tissue. FINDINGS: Patient was unable to complete the study due to claustrophobia. Technically difficult images due to trouble with breath-holding. Limited images of the lung fields showed a right upper lobe mass consistent with known prior tumor. Moderately dilated left  ventricle with normal wall thickness. Global hypokinesis, LV EF 16%. No LV thrombus noted. Mildly dilated RV wtih RV EF 25%. Mild biatrial enlargement. Visually at least moderate tricuspid regurgitation and mild mitral regurgitation. Trileaflet aortic valve with no stenoiss or regurgitation. No flow sequences done so unable to quantify regurgitation. On delayed enhancement imaging, there was mid-wall late gadolinium enhancement (LGE) in the basal to mid inferoseptal wall at the inferior RV insertion site. MEASUREMENTS: MEASUREMENTS LVEDV 276 mL LVEDVi 157 mL/m2  LVSV 45 LVEF 16% RVEDV 224 mL RVEDVi 127 mL/m2 RVSV 56 mL RVEF 25% Global T1 1104, ECV 35% Global T2 49 (normal range) IMPRESSION: 1. Technically difficult study due to difficulty breath-holding. Study stopped early due to claustrophobia. No flow sequences. 2.  Moderately dilated LV with LV EF 16%, diffuse hypokinesis. 3.  Mildly dilated RV with RV EF 25%. 4. At least moderate TR visually, no flow sequences done to quantify. 5. Mid-wall LGE at the basal to mid inferoseptal RV insertion site. This generally is a nonspecific finding associated with pressure/volume overload. 6. Elevated extracellular volume percentage at 35% suggests increase myocardial fibrotic content. 7.  Normal global T2 This study is not consistent with active myocarditis. Antonio Payne Electronically Signed   By: Antonio Payne M.D.   On: 08/25/2024 14:31     Medications:     Scheduled Medications:  arformoterol   15 mcg Nebulization BID   budesonide  (PULMICORT ) nebulizer solution  0.25 mg Nebulization BID   Chlorhexidine  Gluconate Cloth  6 each Topical Daily   dapagliflozin  propanediol  10 mg Oral Daily   digoxin   0.125 mg Oral Daily   levothyroxine   100 mcg Oral Q0600   revefenacin   175 mcg Nebulization Daily   rosuvastatin   20 mg Oral Daily   sacubitril -valsartan   1 tablet Oral BID   sodium chloride  flush  10-40 mL Intracatheter Q12H   sodium chloride  flush  3 mL Intravenous Q12H   spironolactone   12.5 mg Oral Daily    Infusions:   PRN Medications: acetaminophen  **OR** acetaminophen , ipratropium-albuterol , ondansetron  **OR** ondansetron  (ZOFRAN ) IV, sodium chloride  flush, sodium chloride  flush    Patient Profile   Antonio Payne is a 68 y.o. 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 AHF team to see for Acute BiV HF with low  output state.   Assessment/Plan   Acute BiV HF with low output, suspected keytruda  toxicity - Echo 8/20: EF 58%, RV normal, no  - Echo 11/20: EF 45%-47%, RV normal, trace MR/TR - Echo 08/06/24 showed EF <20%, LV with GHK, RV severely reduced, LA mild-mod dilated, RA severely dilated, mild MR, mod-severe TR. - L/RHC 08/21/2024 with mild-mod non-obs CAD. Severely elevated L/R filling pressures. Mod-severely reduced fick CO/CI. RA 15, PA 63/32 (42), PCWP 30, PA 50%, Fick CO/CI 3.3/1.8. - cMRI: LVEF 16%, RVEF 25%, at least moderate TR, nonspecific LGE, ECV 35%, study not consistent w/ active myocarditis - Concern for Keytruda  induced toxicity/myocarditis/ HF. May need cardiac biopsy if plan to try to restart Keytruda  in the future. Dr. Zenaida discussed with his Oncologist - NYHA IV on admission - CVP low. Co-ox stable. - PRN lasix  at discharge - Continue Farxiga  10 mg daily.  - Continue digoxin  0.125 mg daily - Entresto  24/26 bid. Scr stable. - Continue spiro 12.5 mg daily - Consider beta blocker at f/u  Metastatic non-small cell lung adenocarcinoma -  Mets to brain and lymph nodes - Follows with Dr. Davonna.  - Last dose of Keytruda  in 9/25 - With acute systolic HF and severely reduced LVEF would hold off on restarting Keytruda . There was a drop on initial echo after starting back in 2020, I see no other echos after that.    CAD - LHC with mild-mod non-obs CAD - LPA 72.5 - LDL 91. - Continue lipid-lowering agents.   COPD - per primary team - Significant emphysema.   Really wants to go home.   Will discuss if okay for discharge from HF standpoint with Dr. Zenaida.  HF meds: Farixga 10 mg daily Digoxin  0.125 mg daily Entresto  24/26 mg BID Spironolactone  25 mg daily Lasix  40 mg PRN Potassium chloride  40 mEq PRN with lasix   Length of Stay: 9  Nasiah Polinsky N, PA-C  08/26/2024, 7:31 AM  Advanced Heart Failure Team Pager [phone] (M-F; 7a - 5p)  Please contact [facility] for night-coverage after hours (5p -7a ) and weekends on [email or web address]

## 2024-08-26 NOTE — Plan of Care (Signed)
  Problem: Education: Goal: Knowledge of General Education information will improve Description: Including pain rating scale, medication(s)/side effects and non-pharmacologic comfort measures Outcome: Adequate for Discharge   Problem: Health Behavior/Discharge Planning: Goal: Ability to manage health-related needs will improve Outcome: Adequate for Discharge   Problem: Clinical Measurements: Goal: Ability to maintain clinical measurements within normal limits will improve Outcome: Adequate for Discharge Goal: Will remain free from infection Outcome: Adequate for Discharge Goal: Diagnostic test results will improve Outcome: Adequate for Discharge Goal: Respiratory complications will improve Outcome: Adequate for Discharge Goal: Cardiovascular complication will be avoided Outcome: Adequate for Discharge   Problem: Activity: Goal: Risk for activity intolerance will decrease Outcome: Adequate for Discharge   Problem: Nutrition: Goal: Adequate nutrition will be maintained Outcome: Adequate for Discharge   Problem: Coping: Goal: Level of anxiety will decrease Outcome: Adequate for Discharge   Problem: Elimination: Goal: Will not experience complications related to bowel motility Outcome: Adequate for Discharge Goal: Will not experience complications related to urinary retention Outcome: Adequate for Discharge   Problem: Pain Managment: Goal: General experience of comfort will improve and/or be controlled Outcome: Adequate for Discharge   Problem: Safety: Goal: Ability to remain free from injury will improve Outcome: Adequate for Discharge   Problem: Skin Integrity: Goal: Risk for impaired skin integrity will decrease Outcome: Adequate for Discharge   Problem: Education: Goal: Knowledge of General Education information will improve Description: Including pain rating scale, medication(s)/side effects and non-pharmacologic comfort measures Outcome: Adequate for  Discharge   Problem: Education: Goal: Ability to demonstrate management of disease process will improve Outcome: Adequate for Discharge Goal: Ability to verbalize understanding of medication therapies will improve Outcome: Adequate for Discharge Goal: Individualized Educational Video(s) Outcome: Adequate for Discharge   Problem: Activity: Goal: Capacity to carry out activities will improve Outcome: Adequate for Discharge   Problem: Cardiac: Goal: Ability to achieve and maintain adequate cardiopulmonary perfusion will improve Outcome: Adequate for Discharge   Problem: Education: Goal: Knowledge of disease or condition will improve Outcome: Adequate for Discharge Goal: Knowledge of the prescribed therapeutic regimen will improve Outcome: Adequate for Discharge Goal: Individualized Educational Video(s) Outcome: Adequate for Discharge   Problem: Activity: Goal: Ability to tolerate increased activity will improve Outcome: Adequate for Discharge Goal: Will verbalize the importance of balancing activity with adequate rest periods Outcome: Adequate for Discharge   Problem: Respiratory: Goal: Ability to maintain a clear airway will improve Outcome: Adequate for Discharge Goal: Levels of oxygenation will improve Outcome: Adequate for Discharge Goal: Ability to maintain adequate ventilation will improve Outcome: Adequate for Discharge   Problem: Education: Goal: Understanding of CV disease, CV risk reduction, and recovery process will improve Outcome: Adequate for Discharge Goal: Individualized Educational Video(s) Outcome: Adequate for Discharge   Problem: Activity: Goal: Ability to return to baseline activity level will improve Outcome: Adequate for Discharge   Problem: Cardiovascular: Goal: Ability to achieve and maintain adequate cardiovascular perfusion will improve Outcome: Adequate for Discharge Goal: Vascular access site(s) Level 0-1 will be maintained Outcome:  Adequate for Discharge   Problem: Health Behavior/Discharge Planning: Goal: Ability to safely manage health-related needs after discharge will improve Outcome: Adequate for Discharge

## 2024-08-26 NOTE — Progress Notes (Signed)
 Explained discharge instructions to patient. Reviewed follow up appointment and next medication administration times. Also reviewed education. Patient verbalized having an understanding for instructions given. All belongings are in the patient's possession. Volunteer services will stop by [facility] pharm to pick up meds prior to transporting out the main entrance.. IV and telemetry were removed. CCMD was notified. No other needs verbalized. Awaiting transport.

## 2024-08-26 NOTE — TOC Progression Note (Signed)
 Transition of Care [facility]) - Progression Note    Patient Details  Name: Antonio Payne MRN: [id number] Date of Birth: January 02, 1956  [facility] CM/SW Contact  Waddell Barnie Rama, RN Phone Number: 08/26/2024, 10:14 AM  Clinical Narrative:    NCM spoke with patient, informed him of the cost of the entresto  which the [name] pharmacy said cost is 449.00 for the brand which is the preferred medication by his insurance, but the cash price would be 101.51.  Patient states he will pay the cash price for now.  NCM informed him after his deductible for medications has been met then the co pay will go down to most likely 47.00.   he has transport home.     Expected Discharge Plan: Home/Self Care Barriers to Discharge: Continued Medical Work up               Expected Discharge Plan and Services       Living arrangements for the past 2 months: Single Family Home Expected Discharge Date: 08/26/24                             Date HH Agency Contacted: 08/19/24 Time HH Agency Contacted: [id number] Representative spoke with at [facility] Agency: Darleene   Social Drivers of Health (SDOH) Interventions SDOH Screenings   Food Insecurity: No Food Insecurity (08/17/2024)  Housing: Low Risk  (08/17/2024)  Transportation Needs: No Transportation Needs (08/17/2024)  Utilities: Not At Risk (08/17/2024)  Alcohol Screen: Low Risk  (12/06/2020)  Depression (PHQ2-9): Low Risk  (08/12/2024)  Financial Resource Strain: Low Risk  (12/06/2020)  Physical Activity: Sufficiently Active (12/06/2020)  Social Connections: Moderately Isolated (08/17/2024)  Stress: No Stress Concern Present (12/06/2020)  Tobacco Use: Medium Risk (08/18/2024)    Readmission Risk Interventions     No data to display

## 2024-08-26 NOTE — Progress Notes (Signed)
 PICC removed per protocol per MD order. Manual pressure applied for 5 mins. Vaseline gauze, gauze, and Tegaderm applied over insertion site. No bleeding or swelling noted. Instructed patient to remain in bed for thirty mins. Educated patient about S/S of infection and when to call MD; no heavy lifting or pressure on right side for 24 hours; keep dressing dry and intact for 24 hours. Pt verbalized comprehension.

## 2024-08-26 NOTE — Discharge Summary (Signed)
 Physician Discharge Summary   RUHAAN Payne FMW:[id number] DOB: [birth year] DOA: 08/17/2024  PCP: Mancil Pfeiffer, PA-C  Admit date: 08/17/2024 Discharge date: 08/26/2024  Admitted From: Home Disposition:  Home Discharging physician: Alm Apo, MD Barriers to discharge: none  Recommendations at discharge: Follow up with cardiology Ensure affordability of GDMT for upcoming year of 2026   Discharge Condition: stable CODE STATUS: Full  Diet recommendation:  Diet Orders (From admission, onward)     Start     Ordered   08/26/24 0000  Diet - low sodium heart healthy        08/26/24 1004   08/21/24 1107  Diet Heart Room service appropriate? Yes; Fluid consistency: Thin; Fluid restriction: 1500 mL Fluid  Diet effective now       Question Answer Comment  Room service appropriate? 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 Cardiology was consulted as well on admission.  He was treated for CHF exacerbation and started on diuresis with IV Lasix . He was transferred to [facility] for right and left heart cath, performed on 08/21/2024.  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  He had elevated pressures in left, right heart and pulmonary artery.  Moderate to severely reduced cardiac output.  Advanced CHF team consulted afterwards.  Assessment and Plan: * Acute combined systolic and diastolic CHF, NYHA class 2 (HCC) - presented with SOB/DOE.  Workup notable for cardiomegaly, LE edema, small pleural effusion - initial pro-BNP on admission 12,562 - s/p IV lasix  at [facility] - s/p R/L heart cath on 08/21/24 at [facility]. NICM noted. Severely elevated PA psi and  R/L heart psi. Mod/sev reduced CO - EF < 20% on echo; Gr 1 DD. Dilated RA, LA. RV sys function severely reduced and enlarged RV - advanced CHF team consulted post cath and managing diuresis  - s/p PICC for coox/CVP monitoring  - continued on GDMT per cardiology at discharge; outpt follow up arranged   Adenocarcinoma of lung, stage 4, right (HCC) - Follows with oncology.  Has been treated with single agent pembrolizumab  and currently noted to be on treatment break - Per oncology, also noted to have responded well with treatment and prognosis does not limit CHF treatment - Keytruda  has been recommended to be held per cardiology and contingent on further discussion with oncology on treatment regimen going forward  Hypothyroidism - TSH normal on admission, 2.630 - Continue Synthroid   HTN (hypertension) - Currently controlled.  Regimen likely being modified with GDMT as per cardiology  COPD (chronic obstructive pulmonary disease) (HCC) - Some wheezing on exam but no significant exacerbation.  No change in his cough or sputum - Responded well to multiple nebulizers throughout hospitalization -Stable breathing at discharge on room air   The patient's acute and chronic medical conditions were treated accordingly. On day of discharge, patient was felt deemed stable for discharge. Patient/family member advised to call PCP or come back to ER if needed.   Principal Diagnosis: Acute combined systolic and diastolic CHF, NYHA class 2 (HCC)  Discharge Diagnoses: Active Hospital Problems   Diagnosis Date Noted   Acute combined systolic  and diastolic CHF, NYHA class 2 (HCC) 08/17/2024    Priority: 1.   Adenocarcinoma of lung, stage 4, right (HCC) 12/08/2020    Priority: 3.   Biventricular failure (HCC) 08/23/2024   Atherosclerosis of native coronary artery of native heart without angina pectoris 08/23/2024   Acute HFrEF (heart failure with reduced ejection fraction) (HCC) 08/23/2024   COPD  (chronic obstructive pulmonary disease) (HCC) 08/21/2024   HTN (hypertension) 08/21/2024   Hypothyroidism 08/21/2024    Resolved Hospital Problems  No resolved problems to display.     Discharge Instructions     Diet - low sodium heart healthy   Complete by: As directed    Increase activity slowly   Complete by: As directed       Allergies as of 08/26/2024   No Known Allergies      Medication List     TAKE these medications    albuterol  108 (90 Base) MCG/ACT inhaler Commonly known as: VENTOLIN  HFA Inhale 2 puffs into the lungs every 4 (four) hours as needed for wheezing or shortness of breath.   digoxin  0.125 MG tablet Commonly known as: LANOXIN  Take 1 tablet (0.125 mg total) by mouth daily.   empagliflozin  10 MG Tabs tablet Commonly known as: Jardiance  Take 1 tablet (10 mg total) by mouth daily before breakfast.   furosemide  40 MG tablet Commonly known as: Lasix  Take 1 tablet (40 mg total) by mouth daily as needed. Weigh yourself daily.  Take a tablet for weight gain of 3 lbs overnight or 5 lbs in a week. What changed:  when to take this reasons to take this additional instructions   levothyroxine  100 MCG tablet Commonly known as: Synthroid  Take 1 tablet (100 mcg total) by mouth daily before breakfast.   potassium chloride  SA 20 MEQ tablet Commonly known as: KLOR-CON  M Take 2 tablets (40 mEq total) by mouth daily as needed. Take as needed with your lasix    rosuvastatin  20 MG tablet Commonly known as: CRESTOR  Take 1 tablet (20 mg total) by mouth daily.   sacubitril -valsartan  24-26 MG Commonly known as: ENTRESTO  Take 1 tablet by mouth 2 (two) times daily.   spironolactone  25 MG tablet Commonly known as: ALDACTONE  Take 1 tablet (25 mg total) by mouth daily.        Follow-up Information     Grooms, Charmaine, [place]. Go in 91 day(s).   Specialty: Physician Assistant Why: Hospital follow up appointment scheduled for November 25, 2024 at 1:20 PM.   PLEASE ARRIVE 10-15 minutes early.  PLEASE call to cancel if you CANNOT make appointment. Contact information: [address] [place] [address] [phone]         [facility] Follow up on 09/02/2024.   Specialty: Cardiology Why: Advanced Heart Failure Clinic 9:30 AM Entrance C, Free Valet Parking Contact information: [address] [place]  [phone]               No Known Allergies  Consultations: Cardiology  Procedures:   Discharge Exam: BP 103/71 (BP Location: Left Arm)   Pulse 91   Temp 97.8 F (36.6 C) (Oral)   Resp 18   Ht 5' 9 (1.753 m)   Wt 64.8 kg   SpO2 97%   BMI 21.10 kg/m  Physical Exam Constitutional:      General: He is not in acute distress.    Appearance: Normal appearance.  HENT:     Head: Normocephalic  and atraumatic.     Mouth/Throat:     Mouth: Mucous membranes are moist.  Eyes:     Extraocular Movements: Extraocular movements intact.  Cardiovascular:     Rate and Rhythm: Normal rate and regular rhythm.  Pulmonary:     Effort: Pulmonary effort is normal. No respiratory distress.     Breath sounds: Wheezing present.  Abdominal:     General: Bowel sounds are normal. There is no distension.     Palpations: Abdomen is soft.     Tenderness: There is no abdominal tenderness.  Musculoskeletal:        General: Normal range of motion.     Cervical back: Normal range of motion and neck supple.     Right lower leg: Edema (2+ initially (improving, now 1-2+)) present.     Left lower leg: Edema (2+ initially (improving, now 1-2+)) present.     Comments: Unna boots in place bilaterally  Skin:    General: Skin is warm and dry.  Neurological:     General: No focal deficit present.     Mental Status: He is alert.  Psychiatric:        Mood and Affect: Mood normal.        Behavior: Behavior normal.      The results of significant diagnostics  from this hospitalization (including imaging, microbiology, ancillary and laboratory) are listed below for reference.   Microbiology: No results found for this or any previous visit (from the past 240 hours).   Labs: BNP (last 3 results) Recent Labs    08/24/24 0508  BNP 553.4*   Basic Metabolic Panel: Recent Labs  Lab 08/22/24 0246 08/23/24 0515 08/24/24 0508 08/25/24 0443 08/26/24 0500  NA 137 133* 133* 130* 129*  K 4.0 3.4* 4.5 4.3 4.5  CL 94* 90* 96* 93* 95*  CO2 26 31 27 28 25   GLUCOSE 96 95 83 73 84  BUN 29* 24* 37* 32* 26*  CREATININE 1.10 0.89 1.23 0.89 0.67  CALCIUM  8.9 8.7* 8.4* 8.8* 8.6*  MG 2.3 2.2 2.2 2.2 2.2   Liver Function Tests: Recent Labs  Lab 08/23/24 1236  AST 32  ALT 36  ALKPHOS 102  BILITOT 0.9  PROT 7.2  ALBUMIN 4.0   No results for input(s): LIPASE, AMYLASE in the last 168 hours. No results for input(s): AMMONIA in the last 168 hours. CBC: Recent Labs  Lab 08/21/24 0302 08/21/24 1026 08/21/24 1029 08/22/24 0246 08/23/24 0515  WBC 5.3  --   --  5.4 5.7  HGB 11.2* 11.2* 11.2* 12.4* 12.7*  HCT 34.9* 33.0* 33.0* 37.9* 38.2*  MCV 98.9  --   --  96.9 94.8  PLT 244  --   --  239 254   Cardiac Enzymes: No results for input(s): CKTOTAL, CKMB, CKMBINDEX, TROPONINI in the last 168 hours. BNP: Invalid input(s): POCBNP CBG: No results for input(s): GLUCAP in the last 168 hours. D-Dimer No results for input(s): DDIMER in the last 72 hours. Hgb A1c No results for input(s): HGBA1C in the last 72 hours. Lipid Profile No results for input(s): CHOL, HDL, LDLCALC, TRIG, CHOLHDL, LDLDIRECT in the last 72 hours. Thyroid  function studies No results for input(s): TSH, T4TOTAL, T3FREE, THYROIDAB in the last 72 hours.  Invalid input(s): FREET3 Anemia work up No results for input(s): VITAMINB12, FOLATE, FERRITIN, TIBC, IRON, RETICCTPCT in the last 72 hours. Urinalysis No results found for:  COLORURINE, APPEARANCEUR, LABSPEC, PHURINE, GLUCOSEU, HGBUR, BILIRUBINUR, KETONESUR, PROTEINUR, UROBILINOGEN, NITRITE, LEUKOCYTESUR Sepsis  Labs Recent Labs  Lab 08/21/24 0302 08/22/24 0246 08/23/24 0515  WBC 5.3 5.4 5.7   Microbiology No results found for this or any previous visit (from the past 240 hours).  Procedures/Studies: MR CARDIAC MORPHOLOGY W WO CONTRAST Result Date: 08/25/2024 CLINICAL DATA:  Cardiomyopathy of uncertain etiology EXAM: CARDIAC MRI TECHNIQUE: The patient was scanned on a 1.5 Tesla GE magnet. A dedicated cardiac coil was used. Functional imaging was done using Fiesta sequences. 2,3, and 4 chamber views were done to assess for RWMA's. Modified Simpson's rule using a short axis stack was used to calculate an ejection fraction on a dedicated work [employer]. The patient received 10 cc of Gadavist . After 10 minutes inversion recovery sequences were used to assess for infiltration and scar tissue. FINDINGS: Patient was unable to complete the study due to claustrophobia. Technically difficult images due to trouble with breath-holding. Limited images of the lung fields showed a right upper lobe mass consistent with known prior tumor. Moderately dilated left ventricle with normal wall thickness. Global hypokinesis, LV EF 16%. No LV thrombus noted. Mildly dilated RV wtih RV EF 25%. Mild biatrial enlargement. Visually at least moderate tricuspid regurgitation and mild mitral regurgitation. Trileaflet aortic valve with no stenoiss or regurgitation. No flow sequences done so unable to quantify regurgitation. On delayed enhancement imaging, there was mid-wall late gadolinium enhancement (LGE) in the basal to mid inferoseptal wall at the inferior RV insertion site. MEASUREMENTS: MEASUREMENTS LVEDV 276 mL LVEDVi 157 mL/m2 LVSV 45 LVEF 16% RVEDV 224 mL RVEDVi 127 mL/m2 RVSV 56 mL RVEF 25% Global T1 1104, ECV 35% Global T2 49 (normal range)  IMPRESSION: 1. Technically difficult study due to difficulty breath-holding. Study stopped early due to claustrophobia. No flow sequences. 2.  Moderately dilated LV with LV EF 16%, diffuse hypokinesis. 3.  Mildly dilated RV with RV EF 25%. 4. At least moderate TR visually, no flow sequences done to quantify. 5. Mid-wall LGE at the basal to mid inferoseptal RV insertion site. This generally is a nonspecific finding associated with pressure/volume overload. 6. Elevated extracellular volume percentage at 35% suggests increase myocardial fibrotic content. 7.  Normal global T2 This study is not consistent with active myocarditis. [name] Electronically Signed   By: Ezra Shuck M.D.   On: 08/25/2024 14:31   CARDIAC CATHETERIZATION Result Date: 08/21/2024   Prox RCA to Mid RCA lesion is 20% stenosed.   Prox Cx lesion is 30% stenosed.   Mid LAD lesion is 40% stenosed with 40% stenosed side branch in 2nd Diag.   1st Diag lesion is 20% stenosed.   Prox LAD to Mid LAD lesion is 25% stenosed.   LV end diastolic pressure is severely elevated.   There is no aortic valve stenosis.   Anticipated discharge date to be determined.   Recommend Aspirin  81mg  daily for moderate CAD. Conclusions: Mild to moderate, non-obstructive coronary artery disease.  Findings are consistent with nonischemic cardiomyopathy. Severely elevated left heart, right heart, and pulmonary artery pressures. Moderately to severely reduced Fick cardiac output/index. Recommendations: Resume diuresis and initiate goal-directed medical therapy as tolerated. Consider advanced heart failure consultation. Medical therapy and risk factor modification to prevent progression of CAD. Lonni Hanson, MD [facility]  US  EKG SITE RITE Result Date: 08/21/2024 If Site Rite image not attached, placement could not be confirmed due to current cardiac rhythm.  DG Chest Port 1 View Result Date: 08/17/2024 EXAM: 1 VIEW(S) XRAY OF THE CHEST 08/17/2024 05:43:00  PM COMPARISON: 07/16/2024 CLINICAL  HISTORY: SOB SOB FINDINGS: LUNGS AND PLEURA: Emphysema. Ill defined nodular opacity in the right upper lobe as before. No pleural effusion. No pneumothorax. HEART AND MEDIASTINUM: Aortic atherosclerosis (ICD10-I70.0). Mild cardiomegaly. BONES AND SOFT TISSUES: No acute osseous abnormality. IMPRESSION: 1. Ill-defined nodular opacity in the right upper lobe, unchanged from 07/16/2024, recommend continued imaging follow-up per lung nodule guidelines if not already established. 2. Emphysema. 3. Mild cardiomegaly. Electronically signed by: Dayne Hassell MD 08/17/2024 05:47 PM EST RP Workstation: [id number]   CT CHEST ABDOMEN PELVIS W CONTRAST Result Date: 08/06/2024 CLINICAL DATA:  Stage IV lung cancer.  * Tracking Code: BO * EXAM: CT CHEST, ABDOMEN, AND PELVIS WITH CONTRAST TECHNIQUE: Multidetector CT imaging of the chest, abdomen and pelvis was performed following the standard protocol during bolus administration of intravenous contrast. RADIATION DOSE REDUCTION: This exam was performed according to the departmental dose-optimization program which includes automated exposure control, adjustment of the mA and/or kV according to patient size and/or use of iterative reconstruction technique. CONTRAST:  OMNIPAQUE  IOHEXOL  300 MG/ML  SOLN COMPARISON:  CT chest 03/12/2024 and PET 09/27/2023. FINDINGS: CT CHEST FINDINGS Cardiovascular: Atherosclerotic calcification of the aorta and coronary arteries. Enlarged pulmonic trunk and heart. No pericardial effusion. Mediastinum/Nodes: Mediastinal lymph nodes are not enlarged by CT size criteria. No hilar or axillary adenopathy. Esophagus is grossly unremarkable. Lungs/Pleura: Centrilobular and paraseptal emphysema. Spiculated nodule in the central right upper lobe measures approximately 2.5 x 2.9 cm, previously 2.3 x 2.7 cm. Calcified left upper lobe granuloma. Spiculated medial left lower lobe nodule measures 1.4 x 1.7 cm (5/67),  previously 1.0 x 1.4 cm. No new pulmonary nodules. Mild basilar septal thickening with a tiny right pleural effusion and trace left pleural fluid. Debris in the airway. Musculoskeletal: Degenerative changes in the spine. No worrisome lytic or sclerotic lesions. Old T8 compression fracture. CT ABDOMEN PELVIS FINDINGS Hepatobiliary: Liver is enlarged, 19.0 cm. No abnormal arterial phase attenuation in the liver. Gallstones. No biliary ductal dilatation. Pancreas: Negative. Spleen: Negative. Adrenals/Urinary Tract: Adrenal glands are unremarkable. Scarring in the kidneys bilaterally. Ureters are decompressed. Bladder is grossly unremarkable. Stomach/Bowel: Stomach, small bowel, appendix and colon are unremarkable. Vascular/Lymphatic: Atherosclerotic calcification of the aorta. No pathologically enlarged lymph nodes. Gastrohepatic ligament lymph nodes are not enlarged by CT size criteria. Reproductive: Prostate is visualized. Other: No free fluid. Small umbilical hernia contains omentum and fat. Musculoskeletal: Degenerative changes in the spine. No worrisome lytic or sclerotic lesions. IMPRESSION: 1. Stable to minimally enlarged spiculated right upper lobe nodule, compatible with primary bronchogenic carcinoma. 2. Stable spiculated left lower lobe nodule, worrisome for a synchronous bronchogenic carcinoma. No evidence of distant metastatic disease. 3. Mild congestive heart failure. 4. Hepatomegaly. 5. Cholelithiasis. 6. Aortic atherosclerosis (ICD10-I70.0). Coronary artery calcification. 7. Enlarged pulmonic trunk, indicative of pulmonary arterial hypertension. 8.  Emphysema (ICD10-J43.9). Electronically Signed   By: Newell Eke M.D.   On: 08/06/2024 16:40   ECHOCARDIOGRAM COMPLETE Result Date: 08/06/2024    ECHOCARDIOGRAM REPORT   Patient Name:   Antonio Payne Date of Exam: 08/06/2024 Medical Rec #:  [id number]     Height:       69.0 in Accession #:    [id number]    Weight:       162.0 lb Date of Birth:  [birth year]       BSA:          1.889 m Patient Age:    68 years      BP:  124/84 mmHg Patient Gender: M             HR:           104 bpm. Exam Location:  [place] Procedure: 2D Echo, 3D Echo, Cardiac Doppler, Color Doppler and Strain Analysis            (Both Spectral and Color Flow Doppler were utilized during            procedure). Indications:    I50.9* Heart failure (unspecified)  History:        Patient has no prior history of Echocardiogram examinations.                 CHF, Lung Cancer; Risk Factors:Former Smoker.  Sonographer:    Bascom Burows RCS, RVS Referring Phys: [id number] HYNDAVI KANDALA IMPRESSIONS  1. Left ventricular ejection fraction, by estimation, is <20%. Left ventricular ejection fraction by 3D volume is 21 %. The left ventricle has severely decreased function. The left ventricle demonstrates global hypokinesis. Left ventricular diastolic parameters are consistent with Grade I diastolic dysfunction (impaired relaxation). Elevated left ventricular end-diastolic pressure. There is the interventricular septum is flattened in systole and diastole, consistent with right ventricular pressure and volume overload. The average left ventricular global longitudinal strain is -3.1 %. The global longitudinal strain is abnormal.  2. Right ventricular systolic function is severely reduced. The right ventricular size is moderately enlarged. There is severely elevated pulmonary artery systolic pressure. The estimated right ventricular systolic pressure is 61.7 mmHg.  3. Left atrial size was mild to moderately dilated.  4. Right atrial size was severely dilated.  5. The mitral valve is normal in structure. Mild mitral valve regurgitation. No evidence of mitral stenosis.  6. The tricuspid valve is abnormal. Tricuspid valve regurgitation is moderate to severe.  7. The aortic valve is tricuspid. Aortic valve regurgitation is not visualized. No aortic stenosis is present.  8. The inferior vena cava is normal in size with  greater than 50% respiratory variability, suggesting right atrial pressure of 3 mmHg. Comparison(s): No prior Echocardiogram. FINDINGS  Left Ventricle: Left ventricular ejection fraction, by estimation, is <20%. Left ventricular ejection fraction by 3D volume is 21 %. The left ventricle has severely decreased function. The left ventricle demonstrates global hypokinesis. The average left  ventricular global longitudinal strain is -3.1 %. Strain was performed and the global longitudinal strain is abnormal. The left ventricular internal cavity size was normal in size. There is no left ventricular hypertrophy. The interventricular septum is  flattened in systole and diastole, consistent with right ventricular pressure and volume overload. Left ventricular diastolic parameters are consistent with Grade I diastolic dysfunction (impaired relaxation). Elevated left ventricular end-diastolic pressure. Right Ventricle: The right ventricular size is moderately enlarged. No increase in right ventricular wall thickness. Right ventricular systolic function is severely reduced. There is severely elevated pulmonary artery systolic pressure. The tricuspid regurgitant velocity is 3.83 m/s, and with an assumed right atrial pressure of 3 mmHg, the estimated right ventricular systolic pressure is 61.7 mmHg. Left Atrium: Left atrial size was mild to moderately dilated. Right Atrium: Right atrial size was severely dilated. Pericardium: There is no evidence of pericardial effusion. Mitral Valve: The mitral valve is normal in structure. Mild mitral valve regurgitation. No evidence of mitral valve stenosis. MV peak gradient, 4.1 mmHg. The mean mitral valve gradient is 2.0 mmHg. Tricuspid Valve: The tricuspid valve is abnormal. Tricuspid valve regurgitation is moderate to severe. No evidence of tricuspid stenosis. Aortic Valve: The  aortic valve is tricuspid. Aortic valve regurgitation is not visualized. No aortic stenosis is present. Aortic  valve peak gradient measures 3.1 mmHg. Pulmonic Valve: The pulmonic valve was normal in structure. Pulmonic valve regurgitation is mild. No evidence of pulmonic stenosis. Aorta: The aortic root and ascending aorta are structurally normal, with no evidence of dilitation. Venous: The inferior vena cava is normal in size with greater than 50% respiratory variability, suggesting right atrial pressure of 3 mmHg. IAS/Shunts: No atrial level shunt detected by color flow Doppler. Additional Comments: 3D was performed not requiring image post processing on an independent workstation and was abnormal.  LEFT VENTRICLE PLAX 2D LVIDd:         5.40 cm         Diastology LVIDs:         5.10 cm         LV e' medial:    2.94 cm/s LV PW:         1.20 cm         LV E/e' medial:  29.6 LV IVS:        0.90 cm         LV e' lateral:   4.79 cm/s LVOT diam:     2.30 cm         LV E/e' lateral: 18.2 LVOT Area:     4.15 cm                                2D Longitudinal                                Strain LV Volumes (MOD)               2D Strain GLS   -4.5 % LV vol d, MOD    139.0 ml      (A4C): A2C:                           2D Strain GLS   -2.1 % LV vol d, MOD    120.0 ml      (A3C): A4C:                           2D Strain GLS   -2.6 % LV vol s, MOD    110.0 ml      (A2C): A2C:                           2D Strain GLS   -3.1 % LV vol s, MOD    98.6 ml       Avg: A4C: LV SV MOD A2C:   29.0 ml       3D Volume EF LV SV MOD A4C:   120.0 ml      LV 3D EF:    Left LV SV MOD BP:    25.2 ml                    ventricul  ar                                             ejection                                             fraction                                             by 3D                                             volume is                                             21 %.                                 3D Volume EF:                                3D EF:        21 %                                 LV EDV:       221 ml                                LV ESV:       175 ml                                LV SV:        46 ml RIGHT VENTRICLE            IVC RV Basal diam:  5.20 cm    IVC diam: 1.80 cm RV Mid diam:    3.20 cm RV S prime:     8.05 cm/s  PULMONARY VEINS TAPSE (M-mode): 0.9 cm     Diastolic Velocity: 52.45 cm/s                            S/D Velocity:       1.00                            Systolic Velocity:  53.75 cm/s LEFT ATRIUM             Index        RIGHT ATRIUM           Index LA diam:  4.50 cm 2.38 cm/m   RA Area:     22.40 cm LA Vol (A2C):   73.6 ml 38.96 ml/m  RA Volume:   79.70 ml  42.19 ml/m LA Vol (A4C):   74.4 ml 39.39 ml/m LA Biplane Vol: 74.6 ml 39.49 ml/m  AORTIC VALVE                PULMONIC VALVE AV Area (Vmax): 2.56 cm    PV Vmax:          0.89 m/s AV Vmax:        88.40 cm/s  PV Peak grad:     3.2 mmHg AV Peak Grad:   3.1 mmHg    PR End Diast Vel: 13.03 msec LVOT Vmax:      54.40 cm/s  AORTA Ao Root diam: 3.20 cm Ao Asc diam:  3.20 cm MITRAL VALVE               TRICUSPID VALVE MV Area (PHT): 4.19 cm    TR Peak grad:   58.7 mmHg MV Peak grad:  4.1 mmHg    TR Vmax:        383.00 cm/s MV Mean grad:  2.0 mmHg MV Vmax:       1.01 m/s    SHUNTS MV Vmean:      73.4 cm/s   Systemic Diam: 2.30 cm MV Decel Time: 181 msec MR Peak grad: 97.2 mmHg MR Mean grad: 61.0 mmHg MR Vmax:      493.00 cm/s MR Vmean:     364.0 cm/s MV E velocity: 87.00 cm/s MV A velocity: 59.10 cm/s MV E/A ratio:  1.47 Vishnu Priya Mallipeddi Electronically signed by Diannah Late Mallipeddi Signature Date/Time: 08/06/2024/4:38:51 PM    Final      Time coordinating discharge: Over 30 minutes    Alm Apo, MD  [facility] 08/26/2024, 11:03 AM

## 2024-08-27 ENCOUNTER — Other Ambulatory Visit: Payer: Self-pay

## 2024-09-01 ENCOUNTER — Telehealth (HOSPITAL_COMMUNITY): Payer: Self-pay

## 2024-09-01 NOTE — Telephone Encounter (Signed)
 Called to confirm/remind patient of their appointment at the [facility] on 09/01/2024.   Appointment:   [x] Confirmed  [] Left mess   [] No answer/No voice mail  [] VM Full/unable to leave message  [] Phone not in service  Patient reminded to bring all medications and/or complete list.  Confirmed patient has transportation. Gave directions, instructed to utilize valet parking.

## 2024-09-02 ENCOUNTER — Telehealth (HOSPITAL_COMMUNITY): Payer: Self-pay

## 2024-09-02 ENCOUNTER — Ambulatory Visit (HOSPITAL_COMMUNITY)
Admission: RE | Admit: 2024-09-02 | Discharge: 2024-09-02 | Disposition: A | Source: Ambulatory Visit | Attending: Physician Assistant

## 2024-09-02 ENCOUNTER — Encounter: Payer: Self-pay | Admitting: Oncology

## 2024-09-02 ENCOUNTER — Other Ambulatory Visit (HOSPITAL_COMMUNITY): Payer: Self-pay

## 2024-09-02 VITALS — BP 118/68 | HR 90 | Wt 154.8 lb

## 2024-09-02 DIAGNOSIS — R9431 Abnormal electrocardiogram [ECG] [EKG]: Secondary | ICD-10-CM | POA: Insufficient documentation

## 2024-09-02 DIAGNOSIS — I428 Other cardiomyopathies: Secondary | ICD-10-CM

## 2024-09-02 DIAGNOSIS — I2510 Atherosclerotic heart disease of native coronary artery without angina pectoris: Secondary | ICD-10-CM | POA: Insufficient documentation

## 2024-09-02 DIAGNOSIS — E039 Hypothyroidism, unspecified: Secondary | ICD-10-CM | POA: Insufficient documentation

## 2024-09-02 DIAGNOSIS — Z79890 Hormone replacement therapy (postmenopausal): Secondary | ICD-10-CM | POA: Insufficient documentation

## 2024-09-02 DIAGNOSIS — Z7984 Long term (current) use of oral hypoglycemic drugs: Secondary | ICD-10-CM | POA: Insufficient documentation

## 2024-09-02 DIAGNOSIS — J449 Chronic obstructive pulmonary disease, unspecified: Secondary | ICD-10-CM | POA: Insufficient documentation

## 2024-09-02 DIAGNOSIS — I5082 Biventricular heart failure: Secondary | ICD-10-CM | POA: Diagnosis not present

## 2024-09-02 DIAGNOSIS — E2740 Unspecified adrenocortical insufficiency: Secondary | ICD-10-CM | POA: Insufficient documentation

## 2024-09-02 DIAGNOSIS — Z79899 Other long term (current) drug therapy: Secondary | ICD-10-CM | POA: Insufficient documentation

## 2024-09-02 DIAGNOSIS — I071 Rheumatic tricuspid insufficiency: Secondary | ICD-10-CM | POA: Insufficient documentation

## 2024-09-02 DIAGNOSIS — Z87891 Personal history of nicotine dependence: Secondary | ICD-10-CM | POA: Insufficient documentation

## 2024-09-02 DIAGNOSIS — I5022 Chronic systolic (congestive) heart failure: Secondary | ICD-10-CM | POA: Insufficient documentation

## 2024-09-02 DIAGNOSIS — D849 Immunodeficiency, unspecified: Secondary | ICD-10-CM | POA: Insufficient documentation

## 2024-09-02 DIAGNOSIS — I110 Hypertensive heart disease with heart failure: Secondary | ICD-10-CM | POA: Insufficient documentation

## 2024-09-02 DIAGNOSIS — C3490 Malignant neoplasm of unspecified part of unspecified bronchus or lung: Secondary | ICD-10-CM | POA: Insufficient documentation

## 2024-09-02 DIAGNOSIS — C7931 Secondary malignant neoplasm of brain: Secondary | ICD-10-CM | POA: Insufficient documentation

## 2024-09-02 LAB — BASIC METABOLIC PANEL WITH GFR
Anion gap: 9 (ref 5–15)
BUN: 21 mg/dL (ref 8–23)
CO2: 26 mmol/L (ref 22–32)
Calcium: 8.9 mg/dL (ref 8.9–10.3)
Chloride: 97 mmol/L — ABNORMAL LOW (ref 98–111)
Creatinine, Ser: 0.83 mg/dL (ref 0.61–1.24)
GFR, Estimated: >60 mL/min (ref >60)
Glucose, Bld: 100 mg/dL — ABNORMAL HIGH (ref 70–99)
Potassium: 4.6 mmol/L (ref 3.5–5.1)
Sodium: 132 mmol/L — ABNORMAL LOW (ref 135–145)

## 2024-09-02 LAB — BRAIN NATRIURETIC PEPTIDE: B Natriuretic Peptide: 556.6 pg/mL — ABNORMAL HIGH (ref 0.0–100.0)

## 2024-09-02 MED ORDER — DIGOXIN 125 MCG PO TABS: 0.1250 mg | ORAL_TABLET | Freq: Every day | ORAL | 11 refills | Status: DC

## 2024-09-02 MED ORDER — SACUBITRIL-VALSARTAN 49-51 MG PO TABS: 1.0000 | ORAL_TABLET | Freq: Two times a day (BID) | ORAL | 3 refills | Status: AC

## 2024-09-02 MED ORDER — SPIRONOLACTONE 25 MG PO TABS: 25.0000 mg | ORAL_TABLET | Freq: Every day | ORAL | 3 refills | Status: DC

## 2024-09-02 MED ORDER — ROSUVASTATIN CALCIUM 20 MG PO TABS: 20.0000 mg | ORAL_TABLET | Freq: Every day | ORAL | 3 refills | Status: AC

## 2024-09-02 NOTE — Telephone Encounter (Signed)
 Advanced Heart Failure Patient Advocate Encounter  The patient was approved for a [employer] grant that will help cover the cost of Digoxin , Entresto , Jardiance , Spironolactone .  Total amount awarded, $7,500.  Effective: 08/03/2024 - 08/02/2025.  BIN [id number] PCN [id number] Group [id number] ID [id number]  Diagnosis verification required. Document uploaded on 09/02/2024, awaiting review.  Rachel DEL, CPhT Rx Patient Advocate Phone: [phone]

## 2024-09-02 NOTE — Patient Instructions (Signed)
 Medication Changes:  INCREASE ENTRESTO  TO 49/51MG  TWICE DAILY   TAKE LASIX  (FUROSEMIDE ) 40MG  FOR 2 DAYS, WITH POTASSIUM FOR 2 DAYS   Lab Work:  Labs done today, your results will be available in MyChart, we will contact you for abnormal readings.  Follow-Up in: 2 WEEKS AS SCHEDULED---DO NOT TAKE DIGOXIN  THE MORNING OF THIS APPOINTMENT   At the [facility], you and your health needs are our priority. We have a designated team specialized in the treatment of Heart Failure. This Care Team includes your primary Heart Failure Specialized Cardiologist (physician), Advanced Practice Providers (APPs- Physician Assistants and Nurse Practitioners), and Pharmacist who all work together to provide you with the care you need, when you need it.   You may see any of the following providers on your designated Care Team at your next follow up:  Dr. Toribio Fuel Dr. Ezra Shuck Dr. Odis Brownie Greig Mosses, NP Caffie Shed, [place] [facility] [place], [place] Beckey Coe, NP Jordan Lee, NP Tinnie Redman, PharmD   Please be sure to bring in all your medications bottles to every appointment.   Need to Contact Us :  If you have any questions or concerns before your next appointment please send us  a message through [place] or call our office at [phone].    TO LEAVE A MESSAGE FOR THE NURSE SELECT OPTION 2, PLEASE LEAVE A MESSAGE INCLUDING: YOUR NAME DATE OF BIRTH CALL BACK NUMBER REASON FOR CALL**this is important as we prioritize the call backs  YOU WILL RECEIVE A CALL BACK THE SAME DAY AS LONG AS YOU CALL BEFORE 4:00 PM

## 2024-09-02 NOTE — Progress Notes (Addendum)
 Advanced Heart Failure Rounding Note   HF Cardiologist: Dr. Cherrie  Chief Complaint: CHF   HPI: Antonio Payne is a 68 y.o. male with hx HFrEF, metastatic non-small cell lung adenocarcinoma previously on Keytruda  (follows with Dr. 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    Now follows with Dr. Davonna. Started Keytruda  Q3 weeks back in August of 2020 (in [place]), eventually switched to Q6 weeks.   Echo 8/20: EF 58%, RV normal, no MR   Echo 11/20: EF 45%-47%, RV normal, trace MR/TR   He was seen by Dr. Davonna 10/25. She had concerns for HF and pneumonitis with SOB and pedal edema so CT CAP and echo were ordered. Keytruda  was stopped (last dose in 9/25).    CT CAP with previously seen nodules/carcinoma and mild CHF.    Echo 08/06/24 EF <20%, LV with GHK, RV severely reduced, LA mild-mod dilated, RA severely dilated, mild MR, mod-severe TR.   [facility] presented to [facility] in [facility] with HF symptoms. He was diuresed and transferred to [facility] for [facility]. L/RHC with mild-mod non-obs CAD, RA 15, PA 63/32 (42), PCWP 30, PA 50%, Fick CO/CI 3.3/1.8. Advanced Heart Failure was consulted. He diuresed with high dose IV lasix , did not require inotrope support. GDMT titrated. 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 CMR and low troponin not consistent with active inflammation so steroids/immunosuppression deferred.   Here today for post hospital CHF follow-up. He is accompanied by his daughter who assists with the history. Home weight 142 lb when he returned home from hospital, up to 150 lb today. He's noticed more lower extremity edema the last few days. No shortness of breath, orthopnea or PND. Reports he ate quite a bit over the Thanksgiving holiday. Does not add salt to food but did eat stuffing, gravy, mashed potatoes. Drinks 1 pot of coffee a day. He is complaint with medications.  ROS: All systems negative except as  listed in HPI, PMH and Problem List.  SH:  Social History   Socioeconomic History   Marital status: Single    Spouse name: Not on file   Number of children: 1   Years of education: Not on file   Highest education level: Not on file  Occupational History   Not on file  Tobacco Use   Smoking status: Former    Current packs/day: 0.00    Types: Cigarettes    Quit date: 12/01/2019    Years since quitting: 4.7    Passive exposure: Past   Smokeless tobacco: Never  Vaping Use   Vaping status: Never Used  Substance and Sexual Activity   Alcohol use: Never   Drug use: Never   Sexual activity: Not Currently    Birth control/protection: Abstinence  Other Topics Concern   Not on file  Social History Narrative   Patient states that he lives at home with his son, daughter in law and 64 year old granddaughter. Pt states that he is independent with ADL's and doesn't require any type of assistive devices. Pt states that he has no issues getting to and from the doctor, however, he has had an issue getting his doctor to call in his prescribed diuretic which in turn, has resulted in his legs and feet swelling to the point of weeping which is what brought him into the hospital today. Pt states he has a great support network within his home.   Social Drivers of Health  Financial Resource Strain: Low Risk  (12/06/2020)   Overall Financial Resource Strain (CARDIA)    Difficulty of Paying Living Expenses: Not hard at all  Food Insecurity: No Food Insecurity (08/17/2024)   Hunger Vital Sign    Worried About Running Out of Food in the Last Year: Never true    Ran Out of Food in the Last Year: Never true  Transportation Needs: No Transportation Needs (08/17/2024)   PRAPARE - Administrator, Civil Service (Medical): No    Lack of Transportation (Non-Medical): No  Physical Activity: Sufficiently Active (12/06/2020)   Exercise Vital Sign    Days of Exercise per Week: 7 days    Minutes of  Exercise per Session: 30 min  Stress: No Stress Concern Present (12/06/2020)   [name] of Occupational Health - Occupational Stress Questionnaire    Feeling of Stress : Only a little  Social Connections: Moderately Isolated (08/17/2024)   Social Connection and Isolation Panel    Frequency of Communication with Friends and Family: More than three times a week    Frequency of Social Gatherings with Friends and Family: More than three times a week    Attends Religious Services: 1 to 4 times per year    Active Member of [employer] or Organizations: No    Attends Banker Meetings: Patient declined    Marital Status: Divorced  Catering Manager Violence: Not At Risk (08/17/2024)   Humiliation, Afraid, Rape, and Kick questionnaire    Fear of Current or Ex-Partner: No    Emotionally Abused: No    Physically Abused: No    Sexually Abused: No    FH: No family history on file.  Past Medical History:  Diagnosis Date   Cancer (HCC)    lung cancer    CHF (congestive heart failure) (HCC)    COPD (chronic obstructive pulmonary disease) (HCC)    Dyspnea    Hypertension    Hypothyroidism     Current Outpatient Medications  Medication Sig Dispense Refill   albuterol  (VENTOLIN  HFA) 108 (90 Base) MCG/ACT inhaler Inhale 2 puffs into the lungs every 4 (four) hours as needed for wheezing or shortness of breath. 6.7 g 3   digoxin  (LANOXIN ) 0.125 MG tablet Take 1 tablet (0.125 mg total) by mouth daily. 30 tablet 11   empagliflozin  (JARDIANCE ) 10 MG TABS tablet Take 1 tablet (10 mg total) by mouth daily before breakfast. 30 tablet 11   furosemide  (LASIX ) 40 MG tablet Take 1 tablet (40 mg total) by mouth daily as needed. Weigh yourself daily.  Take a tablet for weight gain of 3 lbs overnight or 5 lbs in a week. 90 tablet 0   levothyroxine  (SYNTHROID ) 100 MCG tablet Take 1 tablet (100 mcg total) by mouth daily before breakfast. 30 tablet 2   potassium chloride  SA (KLOR-CON  M) 20 MEQ tablet  Take 2 tablets (40 mEq total) by mouth daily as needed. Take as needed with your lasix  30 tablet 3   rosuvastatin  (CRESTOR ) 20 MG tablet Take 1 tablet (20 mg total) by mouth daily. 90 tablet 3   sacubitril -valsartan  (ENTRESTO ) 49-51 MG Take 1 tablet by mouth 2 (two) times daily. 60 tablet 3   spironolactone  (ALDACTONE ) 25 MG tablet Take 1 tablet (25 mg total) by mouth daily. 90 tablet 3   No current facility-administered medications for this encounter.    Vitals:   09/02/24 0931  BP: 118/68  Pulse: 90  SpO2: 99%  Weight: 70.2 kg (154 lb  12.8 oz)    PHYSICAL EXAM:  General:  Well appearing. Ambulated into clinic. Cor: JVP to midneck. Regular rate & rhythm. No murmurs. Lungs: diminished Abdomen: soft, nontender, nondistended.  Extremities: 1+ edema Neuro: alert & orientedx3. Affect pleasant.   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 - L/RHC 08/21/2024 with mild-mod non-obs CAD. RA 15, PA 63/32 (42), PCWP 30, PA 50%, Fick CO/CI 3.3/1.8. 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 Would need endomyocardial biopsy in the future if there was plan to restart Keytruda  or alternative ICI therapy - NYHA II. Volume up on exam. Has gained 8 lb since discharge. Take lasix  40 mg with 40 mEq potassium chloride  X 2 days. Discussed when to use lasix  PRN again today. Cut back fluid and sodium intake.  - Recommended compression stockings - Increase Entresto  to 49/51 mg BID - Continue Jardiance  10 mg daily, given samples to get him  until January. Applying for grant, discussed with HF Pharmacy team. - Continue digoxin  0.125 mg daily, hold day of next visit - Continue spiro 12.5 mg daily - Consider beta blocker next visit - BMET, BNP today - Plan repeat echo in 3 months once on maximally tolerated GDMT   Metastatic non-small cell lung adenocarcinoma - Mets to brain and lymph nodes - Follows with Dr. Davonna. Has upcoming PET scheduled - Last dose of Keytruda  in 9/25. See discussion above.  CAD - LHC 11/25 with mild-mod non-obs CAD - LPA 72.5, LDL 91. - Continue rosuvastatin  20      Follow up 2 weeks with APP, will need repeat BMET and digoxin  level

## 2024-09-02 NOTE — Progress Notes (Signed)
 Medication Samples have been provided to the patient.  Drug name: Jardiance        Strength: 10mg          Qty: 4 boxes  LOT: [id number]  Exp.Date: 12/30/2025  Dosing instructions: take 1 tablet once daily   The patient has been instructed regarding the correct time, dose, and frequency of taking this medication, including desired effects and most common side effects.   Shakia Sebastiano B Sophya Vanblarcom 9:56 AM 09/02/2024

## 2024-09-03 ENCOUNTER — Ambulatory Visit (HOSPITAL_COMMUNITY): Payer: Self-pay | Admitting: Physician Assistant

## 2024-09-03 ENCOUNTER — Inpatient Hospital Stay

## 2024-09-03 ENCOUNTER — Inpatient Hospital Stay: Admitting: Oncology

## 2024-09-04 ENCOUNTER — Inpatient Hospital Stay (HOSPITAL_COMMUNITY): Admit: 2024-09-04 | Discharge: 2024-09-04 | Disposition: A | Attending: Oncology

## 2024-09-04 ENCOUNTER — Inpatient Hospital Stay: Attending: Oncology

## 2024-09-04 DIAGNOSIS — E039 Hypothyroidism, unspecified: Secondary | ICD-10-CM | POA: Insufficient documentation

## 2024-09-04 DIAGNOSIS — C7800 Secondary malignant neoplasm of unspecified lung: Secondary | ICD-10-CM | POA: Insufficient documentation

## 2024-09-04 DIAGNOSIS — Z79890 Hormone replacement therapy (postmenopausal): Secondary | ICD-10-CM | POA: Insufficient documentation

## 2024-09-04 DIAGNOSIS — C3411 Malignant neoplasm of upper lobe, right bronchus or lung: Secondary | ICD-10-CM | POA: Diagnosis present

## 2024-09-04 DIAGNOSIS — C3491 Malignant neoplasm of unspecified part of right bronchus or lung: Secondary | ICD-10-CM

## 2024-09-04 DIAGNOSIS — C7931 Secondary malignant neoplasm of brain: Secondary | ICD-10-CM | POA: Insufficient documentation

## 2024-09-04 DIAGNOSIS — I5020 Unspecified systolic (congestive) heart failure: Secondary | ICD-10-CM | POA: Diagnosis not present

## 2024-09-04 LAB — CBC WITH DIFFERENTIAL/PLATELET
Abs Immature Granulocytes: 0.02 K/uL (ref 0.00–0.07)
Basophils Absolute: 0.1 K/uL (ref 0.0–0.1)
Basophils Relative: 1 %
Eosinophils Absolute: 0.5 K/uL (ref 0.0–0.5)
Eosinophils Relative: 7 %
HCT: 42.3 % (ref 39.0–52.0)
Hemoglobin: 13.9 g/dL (ref 13.0–17.0)
Immature Granulocytes: 0 %
Lymphocytes Relative: 10 %
Lymphs Abs: 0.8 K/uL (ref 0.7–4.0)
MCH: 32.2 pg (ref 26.0–34.0)
MCHC: 32.9 g/dL (ref 30.0–36.0)
MCV: 97.9 fL (ref 80.0–100.0)
Monocytes Absolute: 0.8 K/uL (ref 0.1–1.0)
Monocytes Relative: 10 %
Neutro Abs: 5.5 K/uL (ref 1.7–7.7)
Neutrophils Relative %: 72 %
Platelets: 278 K/uL (ref 150–400)
RBC: 4.32 MIL/uL (ref 4.22–5.81)
RDW: 13 % (ref 11.5–15.5)
WBC: 7.8 K/uL (ref 4.0–10.5)
nRBC: 0 % (ref 0.0–0.2)

## 2024-09-04 LAB — COMPREHENSIVE METABOLIC PANEL WITH GFR
ALT: 33 U/L (ref 0–44)
AST: 27 U/L (ref 15–41)
Albumin: 4.5 g/dL (ref 3.5–5.0)
Alkaline Phosphatase: 110 U/L (ref 38–126)
Anion gap: 12 (ref 5–15)
BUN: 27 mg/dL — ABNORMAL HIGH (ref 8–23)
CO2: 27 mmol/L (ref 22–32)
Calcium: 9.3 mg/dL (ref 8.9–10.3)
Chloride: 95 mmol/L — ABNORMAL LOW (ref 98–111)
Creatinine, Ser: 0.93 mg/dL (ref 0.61–1.24)
GFR, Estimated: >60 mL/min (ref >60)
Glucose, Bld: 107 mg/dL — ABNORMAL HIGH (ref 70–99)
Potassium: 4.3 mmol/L (ref 3.5–5.1)
Sodium: 134 mmol/L — ABNORMAL LOW (ref 135–145)
Total Bilirubin: 0.5 mg/dL (ref 0.0–1.2)
Total Protein: 7.3 g/dL (ref 6.5–8.1)

## 2024-09-04 LAB — TSH: TSH: 2.11 u[IU]/mL (ref 0.350–4.500)

## 2024-09-04 MED ORDER — FLUDEOXYGLUCOSE F - 18 (FDG) INJECTION
8.1300 | Freq: Once | INTRAVENOUS | Status: AC | PRN
  Administered 2024-09-04: 8.13 via INTRAVENOUS

## 2024-09-05 LAB — T4: T4, Total: 9.1 ug/dL (ref 4.5–12.0)

## 2024-09-08 NOTE — Telephone Encounter (Signed)
 Diagnosis verification has been completed. Provided billing information to pharmacy, confirmed Entresto  copay $0. Informed patient by phone.

## 2024-09-12 ENCOUNTER — Other Ambulatory Visit: Payer: Self-pay | Admitting: Oncology

## 2024-09-15 ENCOUNTER — Telehealth (HOSPITAL_COMMUNITY): Payer: Self-pay

## 2024-09-15 NOTE — Telephone Encounter (Signed)
 Called to confirm/remind patient of their appointment at the [facility] on 09/16/24 9:30.   Appointment:   [x] Confirmed  [] Left mess   [] No answer/No voice mail  [] VM Full/unable to leave message  [] Phone not in service  Patient reminded to bring all medications and/or complete list.  Confirmed patient has transportation. Gave directions, instructed to utilize valet parking.

## 2024-09-16 ENCOUNTER — Ambulatory Visit (HOSPITAL_COMMUNITY)
Admission: RE | Admit: 2024-09-16 | Discharge: 2024-09-16 | Disposition: A | Source: Ambulatory Visit | Attending: Cardiology

## 2024-09-16 VITALS — BP 104/80 | HR 85 | Wt 147.2 lb

## 2024-09-16 DIAGNOSIS — Z87891 Personal history of nicotine dependence: Secondary | ICD-10-CM | POA: Diagnosis not present

## 2024-09-16 DIAGNOSIS — I2510 Atherosclerotic heart disease of native coronary artery without angina pectoris: Secondary | ICD-10-CM | POA: Insufficient documentation

## 2024-09-16 DIAGNOSIS — E278 Other specified disorders of adrenal gland: Secondary | ICD-10-CM | POA: Diagnosis not present

## 2024-09-16 DIAGNOSIS — Z79620 Long term (current) use of immunosuppressive biologic: Secondary | ICD-10-CM | POA: Insufficient documentation

## 2024-09-16 DIAGNOSIS — I428 Other cardiomyopathies: Secondary | ICD-10-CM | POA: Insufficient documentation

## 2024-09-16 DIAGNOSIS — Z7984 Long term (current) use of oral hypoglycemic drugs: Secondary | ICD-10-CM | POA: Insufficient documentation

## 2024-09-16 DIAGNOSIS — Z79899 Other long term (current) drug therapy: Secondary | ICD-10-CM | POA: Insufficient documentation

## 2024-09-16 DIAGNOSIS — C779 Secondary and unspecified malignant neoplasm of lymph node, unspecified: Secondary | ICD-10-CM | POA: Insufficient documentation

## 2024-09-16 DIAGNOSIS — C7931 Secondary malignant neoplasm of brain: Secondary | ICD-10-CM | POA: Diagnosis present

## 2024-09-16 DIAGNOSIS — I5022 Chronic systolic (congestive) heart failure: Secondary | ICD-10-CM

## 2024-09-16 DIAGNOSIS — J449 Chronic obstructive pulmonary disease, unspecified: Secondary | ICD-10-CM | POA: Diagnosis not present

## 2024-09-16 DIAGNOSIS — C3490 Malignant neoplasm of unspecified part of unspecified bronchus or lung: Secondary | ICD-10-CM | POA: Insufficient documentation

## 2024-09-16 DIAGNOSIS — I5082 Biventricular heart failure: Secondary | ICD-10-CM | POA: Insufficient documentation

## 2024-09-16 LAB — BASIC METABOLIC PANEL WITH GFR
Anion gap: 9 (ref 5–15)
BUN: 26 mg/dL — ABNORMAL HIGH (ref 8–23)
CO2: 27 mmol/L (ref 22–32)
Calcium: 9.7 mg/dL (ref 8.9–10.3)
Chloride: 99 mmol/L (ref 98–111)
Creatinine, Ser: 0.93 mg/dL (ref 0.61–1.24)
GFR, Estimated: >60 mL/min (ref >60)
Glucose, Bld: 87 mg/dL (ref 70–99)
Potassium: 5 mmol/L (ref 3.5–5.1)
Sodium: 135 mmol/L (ref 135–145)

## 2024-09-16 LAB — DIGOXIN LEVEL: Digoxin Level: 1.1 ng/mL (ref 0.8–2.0)

## 2024-09-16 NOTE — Progress Notes (Signed)
 ReDS Vest / Clip - 09/16/24 0902       ReDS Vest / Clip   Station Marker C    Ruler Value 30    ReDS Value Range Low volume    ReDS Actual Value 32    Anatomical Comments sitting

## 2024-09-16 NOTE — Addendum Note (Signed)
 Encounter addended by: Marcine Caffie HERO, PA-C on: 09/16/2024 11:15 AM  Actions taken: Clinical Note Signed

## 2024-09-16 NOTE — Progress Notes (Addendum)
 [facility] Progress Note   HF Cardiologist: Dr. Cherrie  Chief Complaint: f/u for systolic heart failure   HPI: Antonio Payne is a 68 y.o. male with hx HFrEF, metastatic non-small cell lung adenocarcinoma previously on Keytruda  (follows with Dr. 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    Now follows with Dr. Davonna. Started Keytruda  Q3 weeks back in August of 2020 (in [place]), eventually switched to Q6 weeks.   Echo 8/20: EF 58%, RV normal, no MR   Echo 11/20: EF 45%-47%, RV normal, trace MR/TR   He was seen by Dr. Davonna 10/25. She had concerns for HF and pneumonitis with SOB and pedal edema so CT CAP and echo were ordered. Keytruda  was stopped (last dose in 9/25).    CT CAP with previously seen nodules/carcinoma and mild CHF.    Echo 08/06/24 EF <20%, LV with GHK, RV severely reduced, LA mild-mod dilated, RA severely dilated, mild MR, mod-severe TR.   He presented to [facility] in [facility] with HF symptoms. He was diuresed and transferred to [facility] for [facility]. L/RHC with mild-mod non-obs CAD, RA 15, PA 63/32 (42), PCWP 30, PA 50%, Fick CO/CI 3.3/1.8. Advanced Heart Failure was consulted. He diuresed with high dose IV lasix , did not require inotrope support. GDMT titrated. 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 CMR and low troponin not consistent with active inflammation so steroids/immunosuppression deferred. Discharge wt was 142 lb.   Seen for post hospital f/u 2 wks ago and had endorsed gradual signs of re-accumulating fluid w/ 8 lb wt gain and increased LEE. He had admitted to dietary indiscretion over the Thanksgiving holiday. He was instructed to increase Entresto  to 49-51 mg bid and to take Lasix  40 mg daily x 2 days, then back to PRN therafter.   He returns back today for f/u. His wt is down 7 lb since last clinic visit and ReDs is normal at 32%. BP well controlled 104/80. He  feels better. Breathing improved. No resting dyspnea. LEE also markedly improved. Compliant w/ meds. Tolerating well. No side effects. Denies orthostatic symptoms.   Of note, he had a f/u PET scan 09/04/24, showing  interval growth of LLL nodule and increased hypermetabolic activity consistent with progressive disease, persistent hypermetabolic activity within the spiculated RUL pulmonary nodule consistent with viable disease w/ mild enlargement from prior study.  No evidence of metastatic disease in the abdomen or pelvis.  No osseous metastatic disease.    ROS: All systems negative except as listed in HPI, PMH and Problem List.  SH:  Social History   Socioeconomic History   Marital status: Single    Spouse name: Not on file   Number of children: 1   Years of education: Not on file   Highest education level: Not on file  Occupational History   Not on file  Tobacco Use   Smoking status: Former    Current packs/day: 0.00    Types: Cigarettes    Quit date: 12/01/2019    Years since quitting: 4.7    Passive exposure: Past   Smokeless tobacco: Never  Vaping Use   Vaping status: Never Used  Substance and Sexual Activity   Alcohol use: Never   Drug use: Never   Sexual activity: Not Currently    Birth control/protection: Abstinence  Other Topics Concern   Not on file  Social History Narrative   Patient states that he lives at  home with his son, daughter in law and 74 year old granddaughter. Pt states that he is independent with ADL's and doesn't require any type of assistive devices. Pt states that he has no issues getting to and from the doctor, however, he has had an issue getting his doctor to call in his prescribed diuretic which in turn, has resulted in his legs and feet swelling to the point of weeping which is what brought him into the hospital today. Pt states he has a great support network within his home.   Social Drivers of Health   Tobacco Use: Medium Risk (08/18/2024)    Patient History    Smoking Tobacco Use: Former    Smokeless Tobacco Use: Never    Passive Exposure: Past  Physicist, Medical Strain: Not on file  Food Insecurity: No Food Insecurity (08/17/2024)   Epic    Worried About Programme Researcher, Broadcasting/film/video in the Last Year: Never true    Ran Out of Food in the Last Year: Never true  Transportation Needs: No Transportation Needs (08/17/2024)   Epic    Lack of Transportation (Medical): No    Lack of Transportation (Non-Medical): No  Physical Activity: Not on file  Stress: Not on file  Social Connections: Moderately Isolated (08/17/2024)   Social Connection and Isolation Panel    Frequency of Communication with Friends and Family: More than three times a week    Frequency of Social Gatherings with Friends and Family: More than three times a week    Attends Religious Services: 1 to 4 times per year    Active Member of [employer] or Organizations: No    Attends Banker Meetings: Patient declined    Marital Status: Divorced  Catering Manager Violence: Not At Risk (08/17/2024)   Epic    Fear of Current or Ex-Partner: No    Emotionally Abused: No    Physically Abused: No    Sexually Abused: No  Depression (PHQ2-9): Low Risk (08/12/2024)   Depression (PHQ2-9)    PHQ-2 Score: 0  Alcohol Screen: Not on file  Housing: Low Risk (08/17/2024)   Epic    Unable to Pay for Housing in the Last Year: No    Number of Times Moved in the Last Year: 0    Homeless in the Last Year: No  Utilities: Not At Risk (08/17/2024)   Epic    Threatened with loss of utilities: No  Health Literacy: Not on file    FH: No family history on file.  Past Medical History:  Diagnosis Date   Cancer (HCC)    lung cancer    CHF (congestive heart failure) (HCC)    COPD (chronic obstructive pulmonary disease) (HCC)    Dyspnea    Hypertension    Hypothyroidism     Current Outpatient Medications  Medication Sig Dispense Refill   albuterol  (VENTOLIN  HFA) 108 (90 Base)  MCG/ACT inhaler Inhale 2 puffs into the lungs every 4 (four) hours as needed for wheezing or shortness of breath. 6.7 g 3   digoxin  (LANOXIN ) 0.125 MG tablet Take 1 tablet (0.125 mg total) by mouth daily. 30 tablet 11   empagliflozin  (JARDIANCE ) 10 MG TABS tablet Take 1 tablet (10 mg total) by mouth daily before breakfast. 30 tablet 11   furosemide  (LASIX ) 40 MG tablet Take 1 tablet (40 mg total) by mouth daily as needed. Weigh yourself daily.  Take a tablet for weight gain of 3 lbs overnight or 5 lbs in a week. 90  tablet 0   levothyroxine  (SYNTHROID ) 100 MCG tablet Take 1 tablet (100 mcg total) by mouth daily before breakfast. 30 tablet 2   potassium chloride  SA (KLOR-CON  M) 20 MEQ tablet Take 2 tablets (40 mEq total) by mouth daily as needed. Take as needed with your lasix  30 tablet 3   rosuvastatin  (CRESTOR ) 20 MG tablet Take 1 tablet (20 mg total) by mouth daily. 90 tablet 3   sacubitril -valsartan  (ENTRESTO ) 49-51 MG Take 1 tablet by mouth 2 (two) times daily. 60 tablet 3   spironolactone  (ALDACTONE ) 25 MG tablet Take 1 tablet (25 mg total) by mouth daily. 90 tablet 3   No current facility-administered medications for this encounter.    Vitals:   09/16/24 0902  BP: 104/80  Weight: 66.8 kg (147 lb 3.2 oz)   Wt Readings from Last 3 Encounters:  09/16/24 66.8 kg (147 lb 3.2 oz)  09/02/24 70.2 kg (154 lb 12.8 oz)  08/26/24 64.8 kg (142 lb 13.7 oz)      PHYSICAL EXAM: ReDs 32%, normal  GENERAL: NAD Lungs- diminished at the bases bilaterally  CARDIAC:  JVP not elevated          Normal rate with regular rhythm. No MRG. No LEE  ABDOMEN: Soft, non-tender, non-distended.  EXTREMITIES: Warm and well perfused.  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 - L/RHC 08/21/2024 with mild-mod non-obs CAD. RA 15, PA 63/32 (42), PCWP 30, PA 50%, Fick CO/CI 3.3/1.8. 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 Would need endomyocardial biopsy in the future if there was plan to restart Keytruda  or alternative ICI therapy - NYHA II, improved  - Volume improved, now euvolemic, ReDs 32%  - Continue Entresto  49/51 mg BID. BP too soft for titration  - Continue Jardiance  10 mg daily - Continue digoxin  0.125 mg daily. Check digoxin  level  - Increase Spironolactone  to 25 mg daily. Addendum: BMP resulted w/ K of 5.0, will keep Spiro at 12.5 mg daily   - Consider beta blocker next visit - Plan repeat echo in 3 months once on maximally tolerated GDMT - continue TED hoses  - encouraged low sodium diet/ fluid restriction    Metastatic non-small cell lung adenocarcinoma - Mets to brain and lymph nodes - Follows with Dr. Davonna.  - Last dose of Keytruda  in 9/25. See discussion above. - Recent updated PET 12/25 w/ interval growth of LLL nodule and increased hypermetabolic activity consistent with progressive disease, persistent hypermetabolic activity within the spiculated RUL pulmonary nodule consistent with viable disease w/ mild enlargement from prior study.  No evidence of metastatic disease in the abdomen or pelvis.  No osseous metastatic disease.  CAD - LHC 11/25 with mild-mod non-obs CAD - denies CP  - LPA 72.5, LDL 91. - Continue rosuvastatin  20     F/u w/ APP or pharmD q3-4 wks until GDMT fully optimized, followed by echo in 3 months and appt w/ Dr. Cherrie Caffie Shed, PA-C 09/16/2024

## 2024-09-16 NOTE — Patient Instructions (Signed)
 Medication Changes:  No Changes In Medications at this time.   Lab Work:  Labs done today, your results will be available in MyChart, we will contact you for abnormal readings.  Follow-Up in: 3-4 WEEKS WITH APP CLINIC AS SCHEDULED   At the [facility], you and your health needs are our priority. We have a designated team specialized in the treatment of Heart Failure. This Care Team includes your primary Heart Failure Specialized Cardiologist (physician), Advanced Practice Providers (APPs- Physician Assistants and Nurse Practitioners), and Pharmacist who all work together to provide you with the care you need, when you need it.   You may see any of the following providers on your designated Care Team at your next follow up:  Dr. Toribio Fuel Dr. Ezra Shuck Dr. Odis Brownie Greig Mosses, NP Caffie Shed, [place] [facility] [place], [place] Beckey Coe, NP Jordan Lee, NP Tinnie Redman, PharmD   Please be sure to bring in all your medications bottles to every appointment.   Need to Contact Us :  If you have any questions or concerns before your next appointment please send us  a message through [facility] or call our office at [phone].    TO LEAVE A MESSAGE FOR THE NURSE SELECT OPTION 2, PLEASE LEAVE A MESSAGE INCLUDING: YOUR NAME DATE OF BIRTH CALL BACK NUMBER REASON FOR CALL**this is important as we prioritize the call backs  YOU WILL RECEIVE A CALL BACK THE SAME DAY AS LONG AS YOU CALL BEFORE 4:00 PM

## 2024-09-21 NOTE — Progress Notes (Unsigned)
 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 male with metastatic adenocarcinoma of lung  Metastatic non-small cell lung carcinoma-adenocarcinoma History of metastatic non-small cell lung carcinoma-metastatic to brain and lymph nodes diagnosed in 2020.  Has been on single agent pembrolizumab  since then. Extensive oncology history below Recurrence in axilla lymphadenopathy that is treated with radiation. Last CT scan with stable disease.  - We reviewed the recent CT scan findings together.  There are 2 areas in the lung that are concerning for recurrence of disease.  Will repeat a PET scan in 2 months. 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  - Continue levothyroxine  100 mcg daily.  Probable adrenal insufficiency Labs consistent with low sodium, elevated potassium and elevated TSH pointing towards adrenal insufficiency.  Likely immunotherapy induced. Normal ACTH  and cortisol.  Adrenal insufficiency ruled out.  Pedal edema Patient had elevated proBNP Recent echo with less than 20% LVEF  -Referred to  cardiology - Increase Lasix  to 40 mg twice daily.   The patient understands the plans discussed today and is in agreement with them.  He knows to contact our office if he develops concerns prior to his next appointment.  The total time spent in the appointment was 20 minutes for the encounter with patient, including review of chart and various tests results, discussions about plan of care and coordination of care plan   [name], MD  [facility] [place] - A DEPT OF [facility] [address] [place] [address] Dept: [phone] Dept Fax: [phone]   No orders of the defined types were placed in this encounter.    ONCOLOGY HISTORY:   I have reviewed his chart and materials related to his cancer extensively and collaborated history with the patient. 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  -05/09/2019: Left supraclavicular lymph node biopsy. Pathology: Metastatic lung adenocarcinoma, poorly differentiated. NGS: Negative for ALK, ROS1, RET, BRAF V600, and EGFR. PD-L1 TPS >95%. -05/21/2019: PET scan:Within the right upper lobe there is a markedly hypermetabolic spiculated 4.4 x 3.0 x 2.0 cm mass. Finding is compatible with the known biopsy-proven malignancy. Left level 5B and bilateral supraclavicular hypermetabolic adenopathy. Left axillary hypermetabolic adenopathy. Mediastinal and right hilar and infrahilar hypermetabolic adenopathy. Bilateral retrocrural and retroperitoneal hypermetabolic adenopathy. Previously identified large pericardial effusion has resolved. There is a focus of increased metabolic activity in the intercostal space between the left anterior 5th  and 6th ribs likely reflecting the recent pericardial window approach.   -05/21/2019: MRI brain: 3 ring-enhancing lesions involving the left cerebral hemisphere, right pons and right cerebellum. -05/2019-current: Single agent Keytruda  200 mg every 3 weeks, and transitioned to 400mg  every 6 weeks  -12/24/2020: MRI Brain: NED -12/24/2020: CT CAP: Previously noted right upper lobe mass is stable in size compared to the prior study, likely to represent a treated lesion, with some surrounding postradiation changes. No definitive findings to suggest new metastatic disease elsewhere in the chest, abdomen or pelvis. Small 3-4 mm pulmonary nodules in the lungs bilaterally, nonspecific, but similar to the prior study and statistically likely to be benign. -06/21/2022: CT chest:  Irregular solid 1.4 cm posteromedial left lower lobe pulmonary nodule, slightly increased, suspicious for malignant nodule, either a metachronous primary bronchogenic malignancy versus a contralateral pulmonary metastasis. Otherwise stable chest CT. Spiculated solid 3.4 cm right upper lobe lung mass is stable. Additional tiny bilateral pulmonary nodules are stable. Mild left axillary and left retropectoral adenopathy is stable, cannot exclude metastatic adenopathy. -08/03/2022: PET:  Hypermetabolic spiculated solid 3.3 cm right upper lobe lung mass, compatible with viable primary bronchogenic carcinoma. Solid irregular 1.3 cm medial left lower lobe pulmonary nodule with low level FDG uptake, suspicious for contralateral pulmonary metastasis. Hypermetabolic left retropectoral and left axillary nodal metastases. Solitary hypermetabolic left level II neck lymph node, compatible with nodal metastasis. No hypermetabolic metastatic disease in the abdomen, pelvis or skeleton. -08/31/2022: Left axillary lymph node biopsy.  Pathology: Metastatic poorly differentiated carcinoma to a lymph node, consistent with patient's clinical history of primary lung adenocarcinoma. -10/06/2022: CarisNGS: PD-L1 TPS 100%, no other  targetable mutations. TMB-low. MSI-stable. CDKN2A pathogenic variant.  -05/08/2023-05/28/2023: Radiation therapy to left axillary lymph node -09/27/2023: PET: Interval complete or near complete clearing of the previous left axillary and subpectoral adenopathy, with only hazy stranding in this vicinity currently. The dominant right upper lobe nodule is similar in size and activity to the prior exam. The superior segment left lower lobe nodule is similar in size and activity to the prior exam. -12/17/2023: CT chest:  Interval increase in the size of the spiculated nodule in the left lower lobe. Interval development of a 4 mm area of architectural nodularity in the posterior left upper lobe along the fissure. Interval decrease in the size of the spiculated dominant nodule in the right upper lobe. Resolution of the previously seen left axillary adenopathy. T8 compression fracture with approximately 50% loss of vertebral body height, new since the prior CT, and concerning for a pathologic fracture. -03/12/2024: CT chest: Stable disease.  -08/06/2024: CT CAP: Stable to minimally enlarged spiculated right upper lobe nodule, compatible with primary bronchogenic carcinoma. Stable spiculated left lower lobe nodule, worrisome for a synchronous bronchogenic carcinoma. No evidence of distant metastatic disease. Mild congestive heart failure.  Current Treatment:  Pembrolizumab  400mg  every 6 weeks  INTERVAL HISTORY:   Discussed the use of AI scribe software for clinical note transcription with the patient, who gave verbal consent to proceed.  History of Present Illness      I have reviewed the past medical history, past surgical history, social history and family history with the patient and they are unchanged from previous note.  ALLERGIES:  has no known allergies.  MEDICATIONS:  Current Outpatient Medications  Medication Sig Dispense Refill   albuterol  (VENTOLIN  HFA) 108 (90 Base) MCG/ACT inhaler Inhale  2 puffs into the lungs every 4 (four) hours as needed for wheezing or shortness of breath. 6.7  g 3   digoxin  (LANOXIN ) 0.125 MG tablet Take 1 tablet (0.125 mg total) by mouth daily. 30 tablet 11   empagliflozin  (JARDIANCE ) 10 MG TABS tablet Take 1 tablet (10 mg total) by mouth daily before breakfast. 30 tablet 11   furosemide  (LASIX ) 40 MG tablet Take 1 tablet (40 mg total) by mouth daily as needed. Weigh yourself daily.  Take a tablet for weight gain of 3 lbs overnight or 5 lbs in a week. 90 tablet 0   levothyroxine  (SYNTHROID ) 100 MCG tablet Take 1 tablet (100 mcg total) by mouth daily before breakfast. 30 tablet 2   potassium chloride  SA (KLOR-CON  M) 20 MEQ tablet Take 2 tablets (40 mEq total) by mouth daily as needed. Take as needed with your lasix  30 tablet 3   rosuvastatin  (CRESTOR ) 20 MG tablet Take 1 tablet (20 mg total) by mouth daily. 90 tablet 3   sacubitril -valsartan  (ENTRESTO ) 49-51 MG Take 1 tablet by mouth 2 (two) times daily. 60 tablet 3   spironolactone  (ALDACTONE ) 25 MG tablet Take 1 tablet (25 mg total) by mouth daily. 90 tablet 3   No current facility-administered medications for this visit.    VITALS:  There were no vitals taken for this visit.  Wt Readings from Last 3 Encounters:  09/16/24 147 lb 3.2 oz (66.8 kg)  09/02/24 154 lb 12.8 oz (70.2 kg)  08/26/24 142 lb 13.7 oz (64.8 kg)    There is no height or weight on file to calculate BMI.  Performance status (ECOG): 1 - Symptomatic but completely ambulatory  PHYSICAL EXAM:   GENERAL:alert, no distress and comfortable SKIN: skin color, texture, turgor are normal, no rashes or significant lesions LYMPH:  no palpable lymphadenopathy in the cervical, axillary or inguinal LUNGS: Decreased breath sounds bilaterally.  No rales or wheezes heard. HEART: regular rate & rhythm and no murmurs , bilateral pitting pedal edema 1+ (improved from prior. ABDOMEN:abdomen soft, non-tender and normal bowel sounds Musculoskeletal:no  cyanosis of digits and no clubbing  NEURO: alert & oriented x 3 with fluent speech  LABORATORY DATA:  I have reviewed the data as listed   Latest Reference Range & Units 07/16/24 10:25 07/16/24 13:20  Pro Brain Natriuretic Peptide <300.0 pg/mL 6,828.0 (H)   Troponin T High Sensitivity 0 - 19 ng/L 28 (H) 24 (H)  (H): Data is abnormally high   Latest Reference Range & Units 07/23/24 11:31  TSH 0.350 - 4.500 uIU/mL 14.000 (H)  (H): Data is abnormally high  Latest Reference Range & Units 08/06/24 13:09  C206 ACTH  7.2 - 63.3 pg/mL 60.5  Cortisol, Plasma ug/dL 81.6    RADIOGRAPHIC STUDIES: I have personally reviewed the radiological report as below  NM PET Image Restag (PS) Skull Base To Thigh CLINICAL DATA:  Subsequent treatment strategy for non-small cell lung cancer. Prior immunotherapy.  EXAM: NUCLEAR MEDICINE PET SKULL BASE TO THIGH  TECHNIQUE: 8.13 mCi F-18 FDG was injected intravenously. Full-ring PET imaging was performed from the skull base to thigh after the radiotracer. CT data was obtained and used for attenuation correction and anatomic localization.  Fasting blood glucose: 96 mg/dl  COMPARISON:  PET-CT 87/73/7975. CT of the chest, abdomen and pelvis 08/06/2024.  FINDINGS: Mediastinal blood pool activity: SUV max 1.7  NECK:  No hypermetabolic cervical lymph nodes are identified. No suspicious activity identified within the pharyngeal mucosal space.  Incidental CT findings: Bilateral carotid atherosclerosis.  CHEST:  There are no hypermetabolic mediastinal, hilar or axillary lymph nodes. The spiculated right  upper lobe pulmonary nodule measures 3.1 x 2.3 cm on image 23/203 and has an SUV max of 11.4. This nodule measured approximately 2.6 x 2.4 cm on previous PET-CT and had an SUV max of 12.4. Solid spiculated nodule medially in the left lower lobe has enlarged from the previous PET-CT, measuring 1.7 x 1.4 cm on image 67/5. This has an SUV max of 4.5  (previously 2.4). No other hypermetabolic pulmonary activity or suspicious nodularity. There are 2 foci of pleural based hypermetabolic activity on the left, with an anterior component near the left ventricular apex demonstrating an SUV max of 3.5 and a component along the medial aspect of the left hemidiaphragm, with an SUV max of 4.9.  Incidental CT findings: Atherosclerosis of the aorta, great vessels and coronary arteries. Moderate centrilobular and paraseptal emphysema. No significant pleural or pericardial effusion.  ABDOMEN/PELVIS:  There is no hypermetabolic activity within the liver, adrenal glands, spleen or pancreas. There is no hypermetabolic nodal activity in the abdomen or pelvis.  Incidental CT findings: Aortic and branch vessel atherosclerosis. Stable umbilical hernia containing fat and a small amount of fluid.  SKELETON:  There is no hypermetabolic activity to suggest osseous metastatic disease.  Incidental CT findings: Mild spondylosis.  IMPRESSION: 1. Persistent hypermetabolic activity within the spiculated right upper lobe pulmonary nodule consistent with viable disease. The nodule has mildly enlarged from previous PET-CT. 2. The spiculated left lower lobe pulmonary nodule has enlarged and demonstrates increased hypermetabolic activity consistent with progressive disease. 3. Two foci of pleural based hypermetabolic activity on the left suspicious for pleural metastases. 4. No evidence of metastatic disease in the abdomen or pelvis. 5. Aortic Atherosclerosis (ICD10-I70.0) and Emphysema (ICD10-J43.9).  Electronically Signed   By: Elsie Perone M.D.   On: 09/04/2024 16:15    "

## 2024-09-22 ENCOUNTER — Other Ambulatory Visit (HOSPITAL_COMMUNITY): Payer: Self-pay

## 2024-09-22 ENCOUNTER — Inpatient Hospital Stay: Admitting: Oncology

## 2024-09-22 ENCOUNTER — Ambulatory Visit (HOSPITAL_COMMUNITY): Payer: Self-pay | Admitting: Cardiology

## 2024-09-22 VITALS — BP 115/81 | HR 54 | Temp 98.1°F | Resp 18 | Wt 145.8 lb

## 2024-09-22 DIAGNOSIS — C3491 Malignant neoplasm of unspecified part of right bronchus or lung: Secondary | ICD-10-CM

## 2024-09-22 DIAGNOSIS — I5020 Unspecified systolic (congestive) heart failure: Secondary | ICD-10-CM | POA: Diagnosis not present

## 2024-09-22 DIAGNOSIS — E039 Hypothyroidism, unspecified: Secondary | ICD-10-CM | POA: Diagnosis not present

## 2024-09-22 DIAGNOSIS — C3411 Malignant neoplasm of upper lobe, right bronchus or lung: Secondary | ICD-10-CM | POA: Diagnosis not present

## 2024-09-22 LAB — MISCELLANEOUS TEST

## 2024-09-22 NOTE — Patient Instructions (Addendum)
 [facility] Discharge Instructions   You were seen and examined today by Dr. Davonna.  She reviewed the results of your lab work which are normal/stable.   She reviewed the results of your PET scan which is showing the cancer is growing and has spread. We will need to change treatments if you should opt for treatment.   Return as scheduled.    Thank you for choosing [facility] at [facility] to provide your oncology and hematology care.  To afford each patient quality time with our provider, please arrive at least 15 minutes before your scheduled appointment time.   If you have a lab appointment with the Cancer Center please come in thru the Main Entrance and check in at the main information desk.  You need to re-schedule your appointment should you arrive 10 or more minutes late.  We strive to give you quality time with our providers, and arriving late affects you and other patients whose appointments are after yours.  Also, if you no show three or more times for appointments you may be dismissed from the clinic at the providers discretion.     Again, thank you for choosing [facility].  Our hope is that these requests will decrease the amount of time that you wait before being seen by our physicians.       _____________________________________________________________  Should you have questions after your visit to [facility], please contact our office at [phone] and follow the prompts.  Our office hours are 8:00 a.m. and 4:30 p.m. Monday - Friday.  Please note that voicemails left after 4:00 p.m. may not be returned until the following business day.  We are closed weekends and major holidays.  You do have access to a nurse 24-7, just call the main number to the clinic [phone] and do not press any options, hold on the line and a nurse will answer the phone.    For prescription refill requests,  have your pharmacy contact our office and allow 72 hours.    Due to Covid, you will need to wear a mask upon entering the hospital. If you do not have a mask, a mask will be given to you at the Main Entrance upon arrival. For doctor visits, patients may have 1 support person age 56 or older with them. For treatment visits, patients can not have anyone with them due to social distancing guidelines and our immunocompromised population.

## 2024-09-23 ENCOUNTER — Inpatient Hospital Stay

## 2024-09-23 ENCOUNTER — Inpatient Hospital Stay: Admitting: Oncology

## 2024-09-29 ENCOUNTER — Encounter: Payer: Self-pay | Admitting: *Deleted

## 2024-10-01 ENCOUNTER — Other Ambulatory Visit: Payer: Self-pay | Admitting: Oncology

## 2024-10-07 ENCOUNTER — Telehealth (HOSPITAL_COMMUNITY): Payer: Self-pay

## 2024-10-07 NOTE — Telephone Encounter (Signed)
 Called to confirm/remind patient of their appointment at the [facility] on 10/09/23.   Appointment:   [] Confirmed  [x] Left mess   [] No answer/No voice mail  [] VM Full/unable to leave message  [] Phone not in service  And to bring in all medications and/or complete list.

## 2024-10-07 NOTE — Progress Notes (Signed)
 "     Advanced Heart Failure Clinic Note   PCP: System, Provider Not In HF Cardiologist: Dr. Cherrie  HPI: Antonio Payne is a 69 y.o. male with hx HFrEF, metastatic non-small cell lung adenocarcinoma previously on Keytruda  (follows with Dr. 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    Now follows with Dr. Davonna. Started Keytruda  Q3 weeks back in August of 2020 (in [place]), eventually switched to Q6 weeks.   Echo 8/20: EF 58%, RV normal, no MR   Echo 11/20: EF 45%-47%, RV normal, trace MR/TR   He was seen by Dr. Davonna 10/25. She had concerns for HF and pneumonitis with SOB and pedal edema so CT CAP and echo were ordered. Keytruda  was stopped (last dose in 9/25).    CT CAP with previously seen nodules/carcinoma and mild CHF.    Echo 08/06/24 EF <20%, LV with GHK, RV severely reduced, LA mild-mod dilated, RA severely dilated, mild MR, mod-severe TR.   He presented to [facility] in 11/25 with HF symptoms. He was diuresed and transferred to [facility] for [facility]. L/RHC with mild-mod non-obs CAD, RA 15, PA 63/32 (42), PCWP 30, PA 50%, Fick CO/CI 3.3/1.8. Advanced Heart Failure was consulted. He diuresed with high dose IV lasix , did not require inotrope support. GDMT titrated. 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 CMR and low troponin not consistent with active inflammation so steroids/immunosuppression deferred. Discharge wt was 142 lbs.   He returns back today for f/u. His wt is down 7 lb since last clinic visit and ReDs is normal at 32%. BP well controlled 104/80. He feels better. Breathing improved. No resting dyspnea. LEE also markedly improved. Compliant w/ meds. Tolerating well. No side effects. Denies orthostatic symptoms.   Of note, he had a f/u PET scan 09/04/24, showing  interval growth of LLL nodule and increased hypermetabolic activity consistent with progressive disease, persistent hypermetabolic activity  within the spiculated RUL pulmonary nodule consistent with viable disease w/ mild enlargement from prior study.  No evidence of metastatic disease in the abdomen or pelvis.  No osseous metastatic disease.    ROS: All systems negative except as listed in HPI, PMH and Problem List.  SH:  Social History   Socioeconomic History   Marital status: Single    Spouse name: Not on file   Number of children: 1   Years of education: Not on file   Highest education level: Not on file  Occupational History   Not on file  Tobacco Use   Smoking status: Former    Current packs/day: 0.00    Types: Cigarettes    Quit date: 12/01/2019    Years since quitting: 4.8    Passive exposure: Past   Smokeless tobacco: Never  Vaping Use   Vaping status: Never Used  Substance and Sexual Activity   Alcohol use: Never   Drug use: Never   Sexual activity: Not Currently    Birth control/protection: Abstinence  Other Topics Concern   Not on file  Social History Narrative   Patient states that he lives at home with his son, daughter in law and 49 year old granddaughter. Pt states that he is independent with ADL's and doesn't require any type of assistive devices. Pt states that he has no issues getting to and from the doctor, however, he has had an issue getting his doctor to call in his prescribed diuretic which in turn, has resulted in his legs  and feet swelling to the point of weeping which is what brought him into the hospital today. Pt states he has a great support network within his home.   Social Drivers of Health   Tobacco Use: Medium Risk (08/18/2024)   Patient History    Smoking Tobacco Use: Former    Smokeless Tobacco Use: Never    Passive Exposure: Past  Physicist, Medical Strain: Not on file  Food Insecurity: No Food Insecurity (08/17/2024)   Epic    Worried About Programme Researcher, Broadcasting/film/video in the Last Year: Never true    Ran Out of Food in the Last Year: Never true  Transportation Needs: No  Transportation Needs (08/17/2024)   Epic    Lack of Transportation (Medical): No    Lack of Transportation (Non-Medical): No  Physical Activity: Not on file  Stress: Not on file  Social Connections: Moderately Isolated (08/17/2024)   Social Connection and Isolation Panel    Frequency of Communication with Friends and Family: More than three times a week    Frequency of Social Gatherings with Friends and Family: More than three times a week    Attends Religious Services: 1 to 4 times per year    Active Member of [employer] or Organizations: No    Attends Banker Meetings: Patient declined    Marital Status: Divorced  Catering Manager Violence: Not At Risk (08/17/2024)   Epic    Fear of Current or Ex-Partner: No    Emotionally Abused: No    Physically Abused: No    Sexually Abused: No  Depression (PHQ2-9): Low Risk (09/22/2024)   Depression (PHQ2-9)    PHQ-2 Score: 0  Alcohol Screen: Not on file  Housing: Low Risk (08/17/2024)   Epic    Unable to Pay for Housing in the Last Year: No    Number of Times Moved in the Last Year: 0    Homeless in the Last Year: No  Utilities: Not At Risk (08/17/2024)   Epic    Threatened with loss of utilities: No  Health Literacy: Not on file    FH: No family history on file.  Past Medical History:  Diagnosis Date   Cancer (HCC)    lung cancer    CHF (congestive heart failure) (HCC)    COPD (chronic obstructive pulmonary disease) (HCC)    Dyspnea    Hypertension    Hypothyroidism     Current Outpatient Medications  Medication Sig Dispense Refill   albuterol  (VENTOLIN  HFA) 108 (90 Base) MCG/ACT inhaler Inhale 2 puffs into the lungs every 4 (four) hours as needed for wheezing or shortness of breath. 6.7 g 3   digoxin  (LANOXIN ) 0.125 MG tablet Take 1 tablet (0.125 mg total) by mouth daily. 30 tablet 11   empagliflozin  (JARDIANCE ) 10 MG TABS tablet Take 1 tablet (10 mg total) by mouth daily before breakfast. 30 tablet 11    furosemide  (LASIX ) 40 MG tablet Take 1 tablet (40 mg total) by mouth daily as needed. Weigh yourself daily.  Take a tablet for weight gain of 3 lbs overnight or 5 lbs in a week. 90 tablet 0   levothyroxine  (SYNTHROID ) 100 MCG tablet Take 1 tablet (100 mcg total) by mouth daily before breakfast. 30 tablet 2   potassium chloride  SA (KLOR-CON  M) 20 MEQ tablet Take 2 tablets (40 mEq total) by mouth daily as needed. Take as needed with your lasix  30 tablet 3   rosuvastatin  (CRESTOR ) 20 MG tablet Take 1 tablet (20  mg total) by mouth daily. 90 tablet 3   sacubitril -valsartan  (ENTRESTO ) 49-51 MG Take 1 tablet by mouth 2 (two) times daily. 60 tablet 3   spironolactone  (ALDACTONE ) 25 MG tablet Take 1 tablet (25 mg total) by mouth daily. 90 tablet 3   No current facility-administered medications for this visit.    There were no vitals filed for this visit.  Wt Readings from Last 3 Encounters:  09/22/24 66.1 kg (145 lb 12.8 oz)  09/16/24 66.8 kg (147 lb 3.2 oz)  09/02/24 70.2 kg (154 lb 12.8 oz)      PHYSICAL EXAM: ReDs 32%, normal  GENERAL: NAD Lungs- diminished at the bases bilaterally  CARDIAC:  JVP not elevated          Normal rate with regular rhythm. No MRG. No LEE  ABDOMEN: Soft, non-tender, non-distended.  EXTREMITIES: Warm and well perfused.  NEUROLOGIC: No obvious FND   ECG:  not performed     ASSESSMENT & PLAN:  Chronic Biventricular Systolic Heart Failure, NICM - suspected keytruda  toxicity - Echo 8/20: EF 58%, RV normal, no  - Echo 11/20: EF 45%-47%, RV normal, trace MR/TR - Echo 08/06/24: EF <20%, LV with GHK, RV severely reduced, mild MR, mod-severe TR. - L/RHC 08/21/2024 with mild-mod non-obs CAD. RA 15, PA 63/32 (42), PCWP 30, PA 50%, Fick CO/CI 3.3/1.8. 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 Would need endomyocardial biopsy in the future if there was plan to restart Keytruda  or alternative ICI therapy - NYHA II, improved  - Volume improved, now euvolemic, ReDs 32%  - Continue Entresto  49/51 mg BID. BP too soft for titration  - Continue Jardiance  10 mg daily - Continue digoxin  0.125 mg daily. Check digoxin  level  - Increase Spironolactone  to 25 mg daily. Addendum: BMP resulted w/ K of 5.0, will keep Spiro at 12.5 mg daily   - Consider beta blocker next visit - Plan repeat echo in 3 months once on maximally tolerated GDMT - continue TED hoses  - encouraged low sodium diet/ fluid restriction    Metastatic non-small cell lung adenocarcinoma - Mets to brain and lymph nodes - Follows with Dr. Davonna.  - Last dose of Keytruda  in 9/25. See discussion above. - Recent updated PET 12/25 w/ interval growth of LLL nodule and increased hypermetabolic activity consistent with progressive disease, persistent hypermetabolic activity within the spiculated RUL pulmonary nodule consistent with viable disease w/ mild enlargement from prior study.  No evidence of metastatic disease in the abdomen or pelvis.  No osseous metastatic disease.  CAD - LHC 11/25 with mild-mod non-obs CAD - denies CP  - LPA 72.5, LDL 91. - Continue rosuvastatin  20     F/u w/ APP or pharmD q3-4 wks until GDMT fully optimized, followed by echo in 3 months and appt w/ Dr. Bensimhon   Bryndle Corredor M Eddie Payette, PA-C 10/07/2024    "

## 2024-10-08 ENCOUNTER — Ambulatory Visit (HOSPITAL_COMMUNITY)
Admission: RE | Admit: 2024-10-08 | Discharge: 2024-10-08 | Disposition: A | Discharge status: Home / Self Care | Source: Ambulatory Visit | Attending: Family Medicine | Admitting: Family Medicine

## 2024-10-08 ENCOUNTER — Ambulatory Visit (HOSPITAL_COMMUNITY): Payer: Self-pay | Admitting: Family Medicine

## 2024-10-08 ENCOUNTER — Encounter (HOSPITAL_COMMUNITY): Payer: Self-pay

## 2024-10-08 VITALS — BP 120/72 | HR 88 | Ht 69.0 in | Wt 150.2 lb

## 2024-10-08 DIAGNOSIS — C3490 Malignant neoplasm of unspecified part of unspecified bronchus or lung: Secondary | ICD-10-CM | POA: Insufficient documentation

## 2024-10-08 DIAGNOSIS — I5022 Chronic systolic (congestive) heart failure: Secondary | ICD-10-CM | POA: Insufficient documentation

## 2024-10-08 DIAGNOSIS — I081 Rheumatic disorders of both mitral and tricuspid valves: Secondary | ICD-10-CM | POA: Diagnosis not present

## 2024-10-08 DIAGNOSIS — E2740 Unspecified adrenocortical insufficiency: Secondary | ICD-10-CM | POA: Insufficient documentation

## 2024-10-08 DIAGNOSIS — E039 Hypothyroidism, unspecified: Secondary | ICD-10-CM | POA: Insufficient documentation

## 2024-10-08 DIAGNOSIS — Z7984 Long term (current) use of oral hypoglycemic drugs: Secondary | ICD-10-CM | POA: Diagnosis not present

## 2024-10-08 DIAGNOSIS — J449 Chronic obstructive pulmonary disease, unspecified: Secondary | ICD-10-CM | POA: Insufficient documentation

## 2024-10-08 DIAGNOSIS — I2510 Atherosclerotic heart disease of native coronary artery without angina pectoris: Secondary | ICD-10-CM | POA: Diagnosis not present

## 2024-10-08 DIAGNOSIS — Z87891 Personal history of nicotine dependence: Secondary | ICD-10-CM | POA: Diagnosis not present

## 2024-10-08 DIAGNOSIS — I110 Hypertensive heart disease with heart failure: Secondary | ICD-10-CM | POA: Insufficient documentation

## 2024-10-08 DIAGNOSIS — I428 Other cardiomyopathies: Secondary | ICD-10-CM | POA: Insufficient documentation

## 2024-10-08 DIAGNOSIS — C7931 Secondary malignant neoplasm of brain: Secondary | ICD-10-CM | POA: Diagnosis not present

## 2024-10-08 DIAGNOSIS — D849 Immunodeficiency, unspecified: Secondary | ICD-10-CM | POA: Insufficient documentation

## 2024-10-08 DIAGNOSIS — Z9226 Personal history of immune checkpoint inhibitor therapy: Secondary | ICD-10-CM | POA: Diagnosis not present

## 2024-10-08 DIAGNOSIS — Z79899 Other long term (current) drug therapy: Secondary | ICD-10-CM | POA: Insufficient documentation

## 2024-10-08 LAB — BASIC METABOLIC PANEL WITH GFR
Anion gap: 9 (ref 5–15)
BUN: 22 mg/dL (ref 8–23)
CO2: 28 mmol/L (ref 22–32)
Calcium: 9.5 mg/dL (ref 8.9–10.3)
Chloride: 97 mmol/L — ABNORMAL LOW (ref 98–111)
Creatinine, Ser: 0.96 mg/dL (ref 0.61–1.24)
GFR, Estimated: >60 mL/min
Glucose, Bld: 100 mg/dL — ABNORMAL HIGH (ref 70–99)
Potassium: 5.7 mmol/L — ABNORMAL HIGH (ref 3.5–5.1)
Sodium: 134 mmol/L — ABNORMAL LOW (ref 135–145)

## 2024-10-08 LAB — DIGOXIN LEVEL: Digoxin Level: 1.2 ng/mL (ref 0.8–2.0)

## 2024-10-08 LAB — PRO BRAIN NATRIURETIC PEPTIDE: Pro Brain Natriuretic Peptide: 1021 pg/mL — ABNORMAL HIGH

## 2024-10-08 MED ORDER — BISOPROLOL FUMARATE 2.5 MG PO TABS: 2.5000 mg | ORAL_TABLET | Freq: Every evening | ORAL | 3 refills | Status: AC

## 2024-10-08 NOTE — Patient Instructions (Addendum)
 Good to see you today!  START Bisoprolol  2.5 mg ( 1 tablet )every night  Labs done today, your results will be available in MyChart, we will contact you for abnormal readings.  Your physician has requested that you have an echocardiogram. Echocardiography is a painless test that uses sound waves to create images of your heart. It provides your doctor with information about the size and shape of your heart and how well your hearts chambers and valves are working. This procedure takes approximately one hour. There are no restrictions for this procedure. Please do NOT wear cologne, perfume, aftershave, or lotions (deodorant is allowed). Please arrive 15 minutes prior to your appointment time.  Please note: We ask at that you not bring children with you during ultrasound (echo/ vascular) testing. Due to room size and safety concerns, children are not allowed in the ultrasound rooms during exams. Our front office staff cannot provide observation of children in our lobby area while testing is being conducted. An adult accompanying a patient to their appointment will only be allowed in the ultrasound room at the discretion of the ultrasound technician under special circumstances. We apologize for any inconvenience.  Your physician recommends that you schedule a follow-up appointment as scheduled  If you have any questions or concerns before your next appointment please send us  a message through [place] or call our office at [phone].    TO LEAVE A MESSAGE FOR THE NURSE SELECT OPTION 2, PLEASE LEAVE A MESSAGE INCLUDING: YOUR NAME DATE OF BIRTH CALL BACK NUMBER REASON FOR CALL**this is important as we prioritize the call backs  YOU WILL RECEIVE A CALL BACK THE SAME DAY AS LONG AS YOU CALL BEFORE 4:00 PM At the [facility], you and your health needs are our priority. As part of our continuing mission to provide you with exceptional heart care, we have created designated Provider  Care Teams. These Care Teams include your primary Cardiologist (physician) and Advanced Practice Providers (APPs- Physician Assistants and Nurse Practitioners) who all work together to provide you with the care you need, when you need it.   You may see any of the following providers on your designated Care Team at your next follow up: Dr Toribio Fuel Dr Ezra Shuck Dr. Morene Brownie Greig Mosses, NP Caffie Shed, [place] [facility] [place], [place] Beckey Coe, NP Jordan Lee, NP Ellouise Class, NP Tinnie Redman, PharmD Jaun Bash, PharmD   Please be sure to bring in all your medications bottles to every appointment.    Thank you for choosing [facility]

## 2024-10-09 ENCOUNTER — Other Ambulatory Visit (HOSPITAL_COMMUNITY)

## 2024-10-09 ENCOUNTER — Other Ambulatory Visit: Payer: Self-pay

## 2024-10-09 ENCOUNTER — Inpatient Hospital Stay

## 2024-10-09 MED ORDER — DIGOXIN 125 MCG PO TABS: 0.0625 mg | ORAL_TABLET | Freq: Every day | ORAL | 11 refills | Status: DC

## 2024-10-09 NOTE — Telephone Encounter (Signed)
 Pt aware and voiced understanding

## 2024-10-10 ENCOUNTER — Other Ambulatory Visit (HOSPITAL_COMMUNITY): Payer: Self-pay

## 2024-10-10 ENCOUNTER — Encounter (HOSPITAL_COMMUNITY): Payer: Self-pay

## 2024-10-10 DIAGNOSIS — I5042 Chronic combined systolic (congestive) and diastolic (congestive) heart failure: Secondary | ICD-10-CM

## 2024-10-12 ENCOUNTER — Other Ambulatory Visit: Payer: Self-pay | Admitting: Oncology

## 2024-10-13 ENCOUNTER — Encounter: Payer: Self-pay | Admitting: Oncology

## 2024-10-13 NOTE — Telephone Encounter (Signed)
 Antonio Payne

## 2024-10-14 ENCOUNTER — Ambulatory Visit (HOSPITAL_COMMUNITY): Admission: RE | Admit: 2024-10-14 | Discharge: 2024-10-14 | Attending: Cardiology

## 2024-10-14 ENCOUNTER — Ambulatory Visit (HOSPITAL_COMMUNITY): Payer: Self-pay | Admitting: Family Medicine

## 2024-10-14 DIAGNOSIS — I5022 Chronic systolic (congestive) heart failure: Secondary | ICD-10-CM | POA: Insufficient documentation

## 2024-10-14 LAB — BASIC METABOLIC PANEL WITH GFR
Anion gap: 9 (ref 5–15)
BUN: 23 mg/dL (ref 8–23)
CO2: 30 mmol/L (ref 22–32)
Calcium: 9.2 mg/dL (ref 8.9–10.3)
Chloride: 99 mmol/L (ref 98–111)
Creatinine, Ser: 1.06 mg/dL (ref 0.61–1.24)
GFR, Estimated: >60 mL/min
Glucose, Bld: 96 mg/dL (ref 70–99)
Potassium: 5.2 mmol/L — ABNORMAL HIGH (ref 3.5–5.1)
Sodium: 139 mmol/L (ref 135–145)

## 2024-10-14 LAB — DIGOXIN LEVEL: Digoxin Level: 0.9 ng/mL (ref 0.8–2.0)

## 2024-10-14 NOTE — Telephone Encounter (Signed)
 Pt aware.

## 2024-10-19 ENCOUNTER — Other Ambulatory Visit: Payer: Self-pay | Admitting: Oncology

## 2024-10-20 ENCOUNTER — Inpatient Hospital Stay: Admitting: Oncology

## 2024-10-20 ENCOUNTER — Encounter: Payer: Self-pay | Admitting: Oncology

## 2024-10-23 ENCOUNTER — Other Ambulatory Visit: Payer: Self-pay

## 2024-10-23 ENCOUNTER — Other Ambulatory Visit: Payer: Self-pay | Admitting: Oncology

## 2024-10-23 DIAGNOSIS — C3491 Malignant neoplasm of unspecified part of right bronchus or lung: Secondary | ICD-10-CM

## 2024-10-23 NOTE — Telephone Encounter (Signed)
 SABRA

## 2024-10-24 ENCOUNTER — Ambulatory Visit: Payer: Self-pay

## 2024-10-24 ENCOUNTER — Inpatient Hospital Stay

## 2024-10-24 ENCOUNTER — Other Ambulatory Visit: Payer: Self-pay

## 2024-10-24 ENCOUNTER — Inpatient Hospital Stay: Attending: Hematology | Admitting: Oncology

## 2024-10-24 VITALS — BP 120/74 | HR 84 | Temp 97.4°F | Resp 18 | Ht 69.0 in | Wt 140.0 lb

## 2024-10-24 DIAGNOSIS — I5020 Unspecified systolic (congestive) heart failure: Secondary | ICD-10-CM

## 2024-10-24 DIAGNOSIS — C3491 Malignant neoplasm of unspecified part of right bronchus or lung: Secondary | ICD-10-CM

## 2024-10-24 DIAGNOSIS — E039 Hypothyroidism, unspecified: Secondary | ICD-10-CM | POA: Diagnosis not present

## 2024-10-24 DIAGNOSIS — E875 Hyperkalemia: Secondary | ICD-10-CM

## 2024-10-24 LAB — COMPREHENSIVE METABOLIC PANEL WITH GFR
ALT: 15 U/L (ref 0–44)
AST: 20 U/L (ref 15–41)
Albumin: 4.6 g/dL (ref 3.5–5.0)
Alkaline Phosphatase: 90 U/L (ref 38–126)
Anion gap: 13 (ref 5–15)
BUN: 15 mg/dL (ref 8–23)
CO2: 23 mmol/L (ref 22–32)
Calcium: 9.7 mg/dL (ref 8.9–10.3)
Chloride: 102 mmol/L (ref 98–111)
Creatinine, Ser: 0.87 mg/dL (ref 0.61–1.24)
GFR, Estimated: >60 mL/min
Glucose, Bld: 97 mg/dL (ref 70–99)
Potassium: 5.6 mmol/L — ABNORMAL HIGH (ref 3.5–5.1)
Sodium: 138 mmol/L (ref 135–145)
Total Bilirubin: 0.3 mg/dL (ref 0.0–1.2)
Total Protein: 7.7 g/dL (ref 6.5–8.1)

## 2024-10-24 LAB — CBC WITH DIFFERENTIAL/PLATELET
Abs Immature Granulocytes: 0.02 K/uL (ref 0.00–0.07)
Basophils Absolute: 0.1 K/uL (ref 0.0–0.1)
Basophils Relative: 1 %
Eosinophils Absolute: 0.7 K/uL — ABNORMAL HIGH (ref 0.0–0.5)
Eosinophils Relative: 10 %
HCT: 43.6 % (ref 39.0–52.0)
Hemoglobin: 14 g/dL (ref 13.0–17.0)
Immature Granulocytes: 0 %
Lymphocytes Relative: 18 %
Lymphs Abs: 1.2 K/uL (ref 0.7–4.0)
MCH: 31.3 pg (ref 26.0–34.0)
MCHC: 32.1 g/dL (ref 30.0–36.0)
MCV: 97.5 fL (ref 80.0–100.0)
Monocytes Absolute: 0.7 K/uL (ref 0.1–1.0)
Monocytes Relative: 11 %
Neutro Abs: 4 K/uL (ref 1.7–7.7)
Neutrophils Relative %: 60 %
Platelets: 255 K/uL (ref 150–400)
RBC: 4.47 MIL/uL (ref 4.22–5.81)
RDW: 12.8 % (ref 11.5–15.5)
WBC: 6.7 K/uL (ref 4.0–10.5)
nRBC: 0 % (ref 0.0–0.2)

## 2024-10-24 LAB — TSH: TSH: 2.96 u[IU]/mL (ref 0.350–4.500)

## 2024-10-24 MED ORDER — SODIUM POLYSTYRENE SULFONATE 15 GM/60ML CO SUSP: 30.0000 g | Freq: Once | 0 refills | Status: AC

## 2024-10-24 NOTE — Progress Notes (Signed)
 Keyexalate 30 gm one dose sent to pharmacy per Dr. Armanda order. Patient aware and agreeable with plan.

## 2024-10-24 NOTE — Progress Notes (Addendum)
 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 male with metastatic adenocarcinoma of lung  Metastatic non-small cell lung carcinoma-adenocarcinoma History of metastatic non-small cell lung carcinoma-metastatic to brain and lymph nodes diagnosed in 2020.  Has been on single agent pembrolizumab  since then. Extensive oncology history below Recurrence in axilla lymphadenopathy that is treated with radiation. Recent PET scan with progression of disease but all of the disease is in the left lung We reviewed the [employer] results that did not show any targetable mutations at this time.  - We discussed that considering patient has no targetable mutations at this time, the standard of care for him and second line since he did not receive chemotherapy before and cannot receive immunotherapy again, would be chemotherapy with bevacizumab.  Patient is very reluctant to get chemotherapy at this time. -Provided printed chemotherapy regimen information for home review to help with making decision. - Discussed chemotherapy: lifelong treatment every three weeks, initial 4-6 cycles, maintenance bevacizumab if responsive. - Reviewed chemotherapy side effects: nausea, vomiting, diarrhea, fatigue, cytopenias, neuropathy. -Also discussed possible other treatment options including radiation therapy.  Will refer to Dr. Dannielle at [facility] for possible SBRT of the 4 lung lesions versus palliative radiation and also to consider the possibility of chemoRT, even though I do not think that this is an option for the patient considering the location of the  lung lesions (not one lung field).  But I would await for radiation oncology consultation. - Patient wants to wait for radiation oncology evaluation before further treatment decisions. - Discussed hospice as an alternative if systemic therapy and radiation are declined. - Provided contact information for further questions and decision-making. - Offered second opinion in [place], declined.  Return to clinic after radiation oncology evaluation to discuss results and further management.  Patient stated that he will call us  to make a follow-up appointment.  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 Patient requires biopsy of the endometrium if future immunotherapy is desired.  -Continue to follow with cardiology for further management. - Will hold further immunotherapy at this time.  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  The patient understands the plans discussed today and is in agreement with them.  He knows to contact our office if he develops concerns prior to his next appointment.  The total time spent in the appointment was 30 minutes for the encounter with patient, including review of chart and various tests results, discussions about plan of care and coordination of care plan   Antonio Dry, MD   CANCER CENTER [facility] [place] - A DEPT OF [facility] [address] [place] [address] Dept: [phone] Dept Fax: [phone]   No orders of the defined types were placed in this encounter.    ONCOLOGY HISTORY:   I have reviewed his chart and materials related to his cancer extensively and collaborated history with the patient. 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  -05/09/2019: Left supraclavicular lymph node biopsy. Pathology: Metastatic lung adenocarcinoma, poorly differentiated. NGS: Negative for ALK, ROS1, RET, BRAF V600, and EGFR. PD-L1 TPS >95%. -05/21/2019: PET scan:Within the right upper lobe there is a markedly hypermetabolic spiculated 4.4 x 3.0 x 2.0 cm mass. Finding is compatible with the known biopsy-proven malignancy. Left level 5B and bilateral supraclavicular hypermetabolic adenopathy. Left axillary hypermetabolic adenopathy. Mediastinal and right hilar and infrahilar hypermetabolic adenopathy. Bilateral retrocrural and retroperitoneal hypermetabolic adenopathy. Previously identified large pericardial effusion has resolved. There is a focus of increased metabolic activity in the intercostal space between the left anterior 5th and 6th ribs likely reflecting the recent pericardial window approach.  -05/21/2019: MRI brain: 3 ring-enhancing lesions involving the left cerebral hemisphere, right pons and right cerebellum. -05/2019-06/11/2024: Single agent Keytruda  200 mg every 3 weeks, and transitioned to 400mg  every 6 weeks. Discontinued for new onset severe heart failure.  -12/24/2020: MRI Brain: NED -12/24/2020: CT CAP: Previously noted right upper lobe mass is stable in size compared to the prior study, likely to represent a treated lesion, with some surrounding postradiation changes. No definitive findings to suggest new metastatic disease elsewhere in the chest, abdomen or pelvis. Small 3-4 mm pulmonary nodules in the lungs bilaterally, nonspecific, but similar to the prior study and statistically likely to be benign. -06/21/2022: CT chest:  Irregular solid 1.4 cm posteromedial left lower lobe pulmonary nodule, slightly increased, suspicious for malignant nodule, either a metachronous primary bronchogenic malignancy versus a contralateral pulmonary metastasis.  Otherwise stable chest CT. Spiculated solid 3.4 cm right upper lobe lung mass is stable. Additional tiny bilateral pulmonary nodules are stable. Mild left axillary and left retropectoral adenopathy is stable, cannot exclude metastatic adenopathy. -08/03/2022: PET:  Hypermetabolic spiculated solid 3.3 cm right upper lobe lung mass, compatible with viable primary bronchogenic carcinoma. Solid irregular 1.3 cm medial left lower lobe pulmonary nodule with low level FDG uptake, suspicious for contralateral pulmonary metastasis. Hypermetabolic left retropectoral and left axillary nodal metastases. Solitary hypermetabolic left level II neck lymph node, compatible with nodal metastasis. No hypermetabolic metastatic disease in the abdomen, pelvis or skeleton. -08/31/2022: Left axillary lymph node biopsy.  Pathology: Metastatic poorly differentiated carcinoma to a lymph node, consistent with patient's clinical history of primary lung adenocarcinoma. -10/06/2022: CarisNGS: PD-L1 TPS 100%, no other targetable mutations. TMB-low. MSI-stable. CDKN2A pathogenic variant.  -05/08/2023-05/28/2023: Radiation therapy to left axillary lymph node -09/27/2023: PET: Interval complete or near complete clearing of the previous left axillary and subpectoral adenopathy, with only hazy stranding in this vicinity currently. The dominant right upper lobe nodule is similar in size and activity to the prior exam. The superior segment left lower lobe nodule is similar in size and activity to the prior exam. -12/17/2023: CT chest:  Interval increase in the size of the spiculated nodule in the left lower lobe. Interval development of a 4 mm area of architectural nodularity in the posterior left upper lobe along the fissure. Interval decrease in the size of the spiculated dominant nodule in the right upper lobe. Resolution of the previously seen left axillary adenopathy. T8 compression fracture with approximately 50% loss of vertebral body  height, new since the prior CT, and concerning for a pathologic fracture. -03/12/2024: CT chest: Stable disease.  -08/06/2024: CT CAP: Stable to minimally enlarged spiculated right upper lobe nodule, compatible with primary bronchogenic  carcinoma. Stable spiculated left lower lobe nodule, worrisome for a synchronous bronchogenic carcinoma. No evidence of distant metastatic disease. Mild congestive heart failure. -08/06/2024: ECHO: LVEF: < 20% -08/17/2024: Admitted to the hospital for severe heart failure -09/04/2024: PET scan:  Persistent hypermetabolic activity within the spiculated right upper lobe pulmonary nodule consistent with viable disease. The nodule has mildly enlarged from previous PET-CT.The spiculated left lower lobe pulmonary nodule has enlarged and demonstrates increased hypermetabolic activity consistent with progressive disease.Two foci of pleural based hypermetabolic activity on the left suspicious for pleural metastases.No evidence of metastatic disease in the abdomen or pelvis.  - 10/06/2024: [id number]: No targetable mutations.  Current Treatment:  Pembrolizumab  400mg  every 6 weeks  INTERVAL HISTORY:   Discussed the use of AI scribe software for clinical note transcription with the patient, who gave verbal consent to proceed.  History of Present Illness Antonio Payne is a 69 year old male with stage IV metastatic non-small cell lung cancer who presents for oncology follow-up to discuss management options in the setting of disease progression.  He is accompanied by his daughter-in-law today.  He was diagnosed with stage IV metastatic non-small cell lung cancer involving the lung, brain, and lymph nodes. He received pembrolizumab  for nearly seven years, initially every three weeks and later every six weeks due to sustained response and disease stability. He tolerated immunotherapy well, without significant adverse effects, and achieved survival beyond expected clinical trial  outcomes until he developed immune mediated cardiomyopathy recently. Recent molecular testing for actionable mutations was negative, precluding targeted oral therapies. Disease progression has prompted consideration of additional systemic therapy and/or radiation.  He is currently evaluating chemotherapy and radiation options. He expresses significant concern regarding chemotherapy-related adverse effects, including nausea, vomiting, diarrhea, fatigue, and cytopenias, and is hesitant to pursue chemotherapy due to anticipated impact on quality of life. He is open to radiation therapy, having previously undergone axillary lymph node irradiation with manageable skin toxicity. He prefers to avoid chemotherapy if possible and is interested in exploring palliative and SBRT radiation approaches, with referral to local radiation oncology for further evaluation.  He has comorbid heart failure, with recurrent fluid overload and dyspnea managed with diuretic therapy. He remains vigilant for signs of fluid retention, such as nasal congestion and dyspnea, and adjusts his diuretic regimen accordingly. He continues to follow with cardiology for ongoing management of heart failure, which complicates oncologic decision-making.   I have reviewed the past medical history, past surgical history, social history and family history with the patient and they are unchanged from previous note.  ALLERGIES:  has no known allergies.  MEDICATIONS:  Current Outpatient Medications  Medication Sig Dispense Refill   albuterol  (VENTOLIN  HFA) 108 (90 Base) MCG/ACT inhaler INHALE 2 PUFFS INTO THE LUNGS EVERY 4 HOURS AS NEEDED FOR WHEEZING OR SHORTNESS OF BREATH 18 g 0   Bisoprolol  Fumarate 2.5 MG TABS Take 2.5 mg by mouth at bedtime. 90 tablet 3   digoxin  (LANOXIN ) 0.125 MG tablet Take 125 mcg by mouth daily.     empagliflozin  (JARDIANCE ) 10 MG TABS tablet Take 1 tablet (10 mg total) by mouth daily before breakfast. 30 tablet 11    furosemide  (LASIX ) 40 MG tablet Take 1 tablet (40 mg total) by mouth daily as needed. Weigh yourself daily.  Take a tablet for weight gain of 3 lbs overnight or 5 lbs in a week. 90 tablet 0   levothyroxine  (SYNTHROID ) 100 MCG tablet TAKE 1 TABLET(100 MCG) BY MOUTH DAILY BEFORE BREAKFAST 30 tablet 2  rosuvastatin  (CRESTOR ) 20 MG tablet Take 1 tablet (20 mg total) by mouth daily. 90 tablet 3   sacubitril -valsartan  (ENTRESTO ) 49-51 MG Take 1 tablet by mouth 2 (two) times daily. 60 tablet 3   sodium polystyrene (KAYEXALATE ) 15 GM/60ML suspension Take 120 mLs (30 g total) by mouth once for 1 dose. 120 mL 0   No current facility-administered medications for this visit.    VITALS:  Blood pressure 120/74, pulse 84, temperature (!) 97.4 F (36.3 C), temperature source Tympanic, resp. rate 18, height 5' 9 (1.753 m), weight 140 lb (63.5 kg), SpO2 95%.  Wt Readings from Last 3 Encounters:  10/24/24 140 lb (63.5 kg)  10/08/24 150 lb 3.2 oz (68.1 kg)  09/22/24 145 lb 12.8 oz (66.1 kg)    Body mass index is 20.67 kg/m.  Performance status (ECOG): 1 - Symptomatic but completely ambulatory  PHYSICAL EXAM:   GENERAL:alert, no distress and comfortable SKIN: skin color, texture, turgor are normal, no rashes or significant lesions LYMPH:  no palpable lymphadenopathy in the cervical, axillary or inguinal LUNGS: Breath sounds significantly improved.  No rales or wheezes heard. HEART: regular rate & rhythm and no murmurs , no pedal edema ABDOMEN:abdomen soft, non-tender and normal bowel sounds Musculoskeletal:no cyanosis of digits and no clubbing  NEURO: alert & oriented x 3 with fluent speech  LABORATORY DATA:  I have reviewed the data as listed  Last CBC Lab Results  Component Value Date   WBC 6.7 10/24/2024   HGB 14.0 10/24/2024   HCT 43.6 10/24/2024   MCV 97.5 10/24/2024   MCH 31.3 10/24/2024   RDW 12.8 10/24/2024   PLT 255 10/24/2024      Chemistry      Component Value Date/Time   NA  138 10/24/2024 1316   K 5.6 (H) 10/24/2024 1316   CL 102 10/24/2024 1316   CO2 23 10/24/2024 1316   BUN 15 10/24/2024 1316   CREATININE 0.87 10/24/2024 1316      Component Value Date/Time   CALCIUM  9.7 10/24/2024 1316   ALKPHOS 90 10/24/2024 1316   AST 20 10/24/2024 1316   ALT 15 10/24/2024 1316   BILITOT 0.3 10/24/2024 1316        Latest Reference Range & Units 10/24/24 13:16  TSH 0.350 - 4.500 uIU/mL 2.960    RADIOGRAPHIC STUDIES: I have personally reviewed the radiological report as below  "

## 2024-10-29 ENCOUNTER — Other Ambulatory Visit: Payer: Self-pay

## 2024-10-29 DIAGNOSIS — C3491 Malignant neoplasm of unspecified part of right bronchus or lung: Secondary | ICD-10-CM

## 2024-11-03 ENCOUNTER — Encounter: Payer: Self-pay | Admitting: Oncology

## 2024-11-05 ENCOUNTER — Ambulatory Visit
Admission: RE | Admit: 2024-11-05 | Discharge: 2024-11-05 | Attending: Radiation Oncology | Admitting: Radiation Oncology

## 2024-11-05 ENCOUNTER — Encounter: Payer: Self-pay | Admitting: Radiation Oncology

## 2024-11-05 VITALS — BP 104/70 | HR 77 | Temp 97.3°F | Resp 18 | Ht 69.0 in | Wt 142.2 lb

## 2024-11-05 DIAGNOSIS — C3411 Malignant neoplasm of upper lobe, right bronchus or lung: Secondary | ICD-10-CM

## 2024-11-05 NOTE — Progress Notes (Addendum)
 " Radiation Oncology         [phone] ________________________________  Initial Outpatient Consultation Note  Name: AJIT ERRICO MRN: [id number]  Date: 11/05/2024  DOB: 11/20/1955  RR:[id number], Provider Not In  Davonna Siad, MD   REFERRING PHYSICIAN: Davonna Siad, MD  DIAGNOSIS: The encounter diagnosis was Malignant neoplasm of right upper lobe of lung (HCC).  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 male who is seen as a courtesy of Dr. Davonna for an opinion concerning radiation therapy as part of management for his recurrent lung cancer. Patient was initially diagnosed with stage 4 adenocarcinoma of the lung in April of 2020 s/p chemotherapy with pembrolizumab  and radiation. In November of 2023, he was found to have metastasis to the left axilla, biopsy proven, for which he received palliative radiation for under the care of Dr. Alm Blumenthal at [facility]. He is currently on Pembrolizumab  400mg  every 6 weeks under the management of Dr. Davonna.       Patient underwent surveillance chest CT on 08/06/24 which showed spiculated nodule in the central right upper lobe measures approximately 2.5 x 2.9 cm, previously 2.3 x 2.7 cm. Calcified left upper lobe granuloma. Spiculated medial left lower lobe nodule measures 1.4 x 1.7 cm (5/67), previously 1.0 x 1.4 cm. Enlargements compatible with primary bronchogenic carcinoma.   In light of findings, a PET scan was performed on 09/04/24 showing persistent hypermetabolic activity within the spiculated right upper lobe pulmonary nodule measuring 3.1 x 2.3 cm with SUV of 11.4, previously measured at 2.6 x 2.4 cm. Scan also showed and enlargement of solid spiculated nodule medially in the left lower lobe measuring at 1.7 x 1.4 cm. Scan also indicated  2 foci of pleural based hypermetabolic activity on the left, with an anterior component near the left ventricular apex  demonstrating an SUV max of 3.5 and a component along the medial aspect of the left hemidiaphragm, with an SUV max of 4.9.  He underwent Guardant360 testing with results that did not show any targetable mutations at this time.   During his most recent visit with Dr. Davonna on 10/24/24, they discussed several treatment options including 4-6 cycles of chemotherapy followed by maintenance bevacizumab and radiation therapy. However, patient prefers to consider radiation oncology evaluation prior to making his final treatment decisions.    Reports being diagnosed with brain metastasis however apparently his immunotherapy took care of this issue.  He denies any radiation treatments to the brain.  Patient is scheduled for a brain MRI on 11/06/24.   PREVIOUS RADIATION THERAPY: Yes, patient reports receiving approximately 15 treatments, palliative intent to the left axilla at [facility] in September 2024.    PAST MEDICAL HISTORY:  Past Medical History:  Diagnosis Date   Cancer (HCC)    lung cancer    CHF (congestive heart failure) (HCC)    COPD (chronic obstructive pulmonary disease) (HCC)    Dyspnea    Hypertension    Hypothyroidism     PAST SURGICAL HISTORY: Past Surgical History:  Procedure Laterality Date   BACK SURGERY     RIGHT/LEFT HEART CATH AND CORONARY ANGIOGRAPHY N/A 08/21/2024   Procedure: RIGHT/LEFT HEART CATH AND CORONARY ANGIOGRAPHY;  Surgeon: Mady Bruckner, MD;  Location: [facility];  Service: Cardiovascular;  Laterality: N/A;    FAMILY HISTORY: History reviewed. No pertinent family history.  SOCIAL HISTORY: Social History[1]  ALLERGIES: Allergies[2]  MEDICATIONS:  Current Outpatient Medications  Medication Sig Dispense Refill   albuterol  (VENTOLIN  HFA) 108 (90 Base) MCG/ACT inhaler INHALE 2 PUFFS INTO THE LUNGS EVERY 4 HOURS AS NEEDED FOR WHEEZING OR SHORTNESS OF BREATH 18 g 0   Bisoprolol  Fumarate 2.5 MG TABS Take 2.5 mg by mouth at bedtime. 90 tablet  3   empagliflozin  (JARDIANCE ) 10 MG TABS tablet Take 1 tablet (10 mg total) by mouth daily before breakfast. 30 tablet 11   furosemide  (LASIX ) 40 MG tablet Take 1 tablet (40 mg total) by mouth daily as needed. Weigh yourself daily.  Take a tablet for weight gain of 3 lbs overnight or 5 lbs in a week. 90 tablet 0   levothyroxine  (SYNTHROID ) 100 MCG tablet TAKE 1 TABLET(100 MCG) BY MOUTH DAILY BEFORE BREAKFAST 30 tablet 2   rosuvastatin  (CRESTOR ) 20 MG tablet Take 1 tablet (20 mg total) by mouth daily. 90 tablet 3   sacubitril -valsartan  (ENTRESTO ) 49-51 MG Take 1 tablet by mouth 2 (two) times daily. 60 tablet 3   digoxin  (LANOXIN ) 0.125 MG tablet Take 125 mcg by mouth daily. (Patient not taking: Reported on 11/05/2024)     No current facility-administered medications for this encounter.    REVIEW OF SYSTEMS:  A 10+ POINT REVIEW OF SYSTEMS WAS OBTAINED including neurology, dermatology, psychiatry, cardiac, respiratory, lymph, extremities, GI, GU, musculoskeletal, constitutional, reproductive, HEENT.  Reports dyspnea on exertion.  He also reports frequent swelling in his lower extremities which is managed well with Lasix .  He denies any headaches or visual issues.  He denies any pain within the chest area significant cough or hemoptysis.  Reports his breathing is helped with use of an inhaler.  He denies use of supplemental oxygen  at home.   PHYSICAL EXAM:  height is 5' 9 (1.753 m) and weight is 142 lb 3.2 oz (64.5 kg). His temperature is 97.3 F (36.3 C) (abnormal). His blood pressure is 104/70 and his pulse is 77. His respiration is 18 and oxygen  saturation is 95%.   General: Alert and oriented, in no acute distress HEENT: Head is normocephalic. Extraocular movements are intact.  Neck: Neck is supple, no palpable cervical or supraclavicular lymphadenopathy. Heart: Regular in rate and rhythm with no murmurs, rubs, or gallops. Chest: Diminished lungs reveals some wheezing bilaterally.  Good air  movement throughout both lungs. Abdomen: Soft, nontender, nondistended, with no rigidity or guarding. Extremities: No cyanosis or edema. Lymphatics: see Neck Exam Skin: No concerning lesions. Musculoskeletal: symmetric strength and muscle tone throughout. Neurologic: Cranial nerves II through XII are grossly intact. No obvious focalities. Speech is fluent. Coordination is intact. Psychiatric: Judgment and insight are intact. Affect is appropriate.   ECOG = 1  0 - Asymptomatic (Fully active, able to carry on all predisease activities without restriction)  1 - Symptomatic but completely ambulatory (Restricted in physically strenuous activity but ambulatory and able to carry out work of a light or sedentary nature. For example, light housework, office work)  2 - Symptomatic, <50% in bed during the day (Ambulatory and capable of all self care but unable to carry out any work activities. Up and about more than 50% of waking hours)  3 - Symptomatic, >50% in bed, but not bedbound (Capable of only limited self-care, confined to bed or chair 50% or more of waking hours)  4 - Bedbound (Completely disabled. Cannot carry on any self-care. Totally confined to bed or chair)  5 - Death   Raylene MM, Creech RH, Tormey DC, et al. [phone]). Toxicity  and response criteria of the [facility]. Am. DOROTHA Bridges. Oncol. 5 (6): 649-55  LABORATORY DATA:  Lab Results  Component Value Date   WBC 6.7 10/24/2024   HGB 14.0 10/24/2024   HCT 43.6 10/24/2024   MCV 97.5 10/24/2024   PLT 255 10/24/2024   NEUTROABS 4.0 10/24/2024   Lab Results  Component Value Date   NA 138 10/24/2024   K 5.6 (H) 10/24/2024   CL 102 10/24/2024   CO2 23 10/24/2024   GLUCOSE 97 10/24/2024   BUN 15 10/24/2024   CREATININE 0.87 10/24/2024   CALCIUM  9.7 10/24/2024      RADIOGRAPHY: No results found.    IMPRESSION: Lung metastases from Stage IV NSCLC originally diagnosed in 2020   We reviewed this  patient's current work-up.  He presents today with four lung metastases from his stage IV NSCLC. The  patient is very reluctant to receive chemotherapy at this time. He has recently been diagnosed with heart failure with reduced ejection fraction.  Recent echo shows 20% left ventricular ejection fraction.  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  In light of this patient's immunotherapy will be held at this time.   He is a candidate for stereotactic body radiotherapy (3-5 fractions administered every other day) or ultra-hypofractionated radiation (10 fractions delivered Monday through Friday), contingent on the location of these lung lesions with planning and dose volume histograms concerning critical structures such as the heart.. Of note, the left lung lesion that is anterior to the heart and is only visible on PET imaging which may make it difficult to localize and treat. We will review his plan after simulation to determine if we will be able to accurately target this lesion.   Today, I talked to the patient about the findings and work-up thus far.  We discussed the natural history of lung metastases and general treatment, highlighting the role of radiotherapy in the management.  We discussed the available radiation techniques, and focused on the details of logistics and delivery.  We reviewed the anticipated acute and late sequelae associated with radiation in this setting.  The patient was encouraged to ask questions that I answered to the best of my ability. A patient consent form was discussed and signed.  We retained a copy for our records.  The patient would like to proceed with radiation and will be scheduled for CT simulation.  PLAN: Patient has been scheduled for CT simulation on 11/11/2024 with the anticipation of starting treatment approximately 1 week after his simulation.   We look forward to participating in this patient's care.    60 minutes of total time  was spent for this patient encounter, including preparation, face-to-face counseling with the patient and coordination of care, physical exam, and documentation of the encounter.   ------------------------------------------------  Lynwood CHARM Nasuti, PhD, MD  This document serves as a record of services personally performed by Lynwood Nasuti, MD. It was created on his behalf by Reymundo Cartwright, a trained medical scribe. The creation of this record is based on the scribe's personal observations and the provider's statements to them. This document has been checked and approved by the attending provider.      [1]  Social History Tobacco Use   Smoking status: Former    Current packs/day: 0.00    Types: Cigarettes    Quit date: 12/01/2019    Years since quitting: 4.9    Passive exposure: Past   Smokeless tobacco:  Never  Vaping Use   Vaping status: Never Used  Substance Use Topics   Alcohol use: Never   Drug use: Never  [2] No Known Allergies  "

## 2024-11-06 ENCOUNTER — Ambulatory Visit (HOSPITAL_COMMUNITY)
Admission: RE | Admit: 2024-11-06 | Discharge: 2024-11-06 | Disposition: A | Source: Ambulatory Visit | Attending: Oncology

## 2024-11-06 DIAGNOSIS — C3491 Malignant neoplasm of unspecified part of right bronchus or lung: Secondary | ICD-10-CM

## 2024-11-06 MED ORDER — GADOBUTROL 1 MMOL/ML IV SOLN
6.5000 mL | Freq: Once | INTRAVENOUS | Status: AC | PRN
  Administered 2024-11-06: 6.5 mL via INTRAVENOUS

## 2024-11-11 ENCOUNTER — Ambulatory Visit: Admitting: Radiation Oncology

## 2024-11-25 ENCOUNTER — Ambulatory Visit: Admitting: Physician Assistant

## 2024-12-01 ENCOUNTER — Other Ambulatory Visit (HOSPITAL_COMMUNITY)

## 2024-12-01 ENCOUNTER — Ambulatory Visit (HOSPITAL_COMMUNITY): Admitting: Internal Medicine
